# Patient Record
Sex: Male | Born: 1945 | Race: White | Hispanic: No | Marital: Married | Smoking: Former smoker
Health system: Southern US, Community
[De-identification: ages and names within clinical notes are randomized; demographics above are authoritative.]

## PROBLEM LIST (undated history)

## (undated) DIAGNOSIS — K219 Gastro-esophageal reflux disease without esophagitis: Secondary | ICD-10-CM

## (undated) DIAGNOSIS — I4891 Unspecified atrial fibrillation: Secondary | ICD-10-CM

## (undated) DIAGNOSIS — F419 Anxiety disorder, unspecified: Secondary | ICD-10-CM

## (undated) DIAGNOSIS — Z8489 Family history of other specified conditions: Secondary | ICD-10-CM

## (undated) DIAGNOSIS — I499 Cardiac arrhythmia, unspecified: Secondary | ICD-10-CM

## (undated) DIAGNOSIS — T7840XA Allergy, unspecified, initial encounter: Secondary | ICD-10-CM

## (undated) DIAGNOSIS — Z86010 Personal history of colonic polyps: Secondary | ICD-10-CM

## (undated) DIAGNOSIS — E7800 Pure hypercholesterolemia, unspecified: Secondary | ICD-10-CM

## (undated) DIAGNOSIS — R413 Other amnesia: Secondary | ICD-10-CM

## (undated) DIAGNOSIS — R351 Nocturia: Secondary | ICD-10-CM

## (undated) DIAGNOSIS — K317 Polyp of stomach and duodenum: Secondary | ICD-10-CM

## (undated) DIAGNOSIS — N400 Enlarged prostate without lower urinary tract symptoms: Secondary | ICD-10-CM

## (undated) DIAGNOSIS — M1990 Unspecified osteoarthritis, unspecified site: Secondary | ICD-10-CM

## (undated) HISTORY — PX: APPENDECTOMY: SHX54

## (undated) HISTORY — DX: Unspecified atrial fibrillation: I48.91

## (undated) HISTORY — DX: Pure hypercholesterolemia, unspecified: E78.00

## (undated) HISTORY — DX: Other amnesia: R41.3

## (undated) HISTORY — DX: Benign prostatic hyperplasia without lower urinary tract symptoms: N40.0

## (undated) HISTORY — DX: Allergy, unspecified, initial encounter: T78.40XA

## (undated) HISTORY — PX: COLOSTOMY: SHX63

## (undated) HISTORY — DX: Polyp of stomach and duodenum: K31.7

## (undated) HISTORY — DX: Personal history of colonic polyps: Z86.010

## (undated) HISTORY — PX: COLONOSCOPY W/ POLYPECTOMY: SHX1380

---

## 2000-08-12 ENCOUNTER — Encounter: Admission: RE | Admit: 2000-08-12 | Discharge: 2000-08-12 | Payer: Self-pay | Admitting: Family Medicine

## 2000-08-12 ENCOUNTER — Encounter: Payer: Self-pay | Admitting: Family Medicine

## 2004-01-09 ENCOUNTER — Encounter: Admission: RE | Admit: 2004-01-09 | Discharge: 2004-01-09 | Payer: Self-pay | Admitting: Family Medicine

## 2005-01-21 HISTORY — PX: OTHER SURGICAL HISTORY: SHX169

## 2005-06-04 ENCOUNTER — Ambulatory Visit: Payer: Self-pay | Admitting: Internal Medicine

## 2005-06-11 ENCOUNTER — Ambulatory Visit: Payer: Self-pay | Admitting: Internal Medicine

## 2005-06-11 ENCOUNTER — Encounter (INDEPENDENT_AMBULATORY_CARE_PROVIDER_SITE_OTHER): Payer: Self-pay | Admitting: *Deleted

## 2006-03-24 ENCOUNTER — Encounter: Admission: RE | Admit: 2006-03-24 | Discharge: 2006-03-24 | Payer: Self-pay | Admitting: Family Medicine

## 2006-04-04 ENCOUNTER — Inpatient Hospital Stay (HOSPITAL_COMMUNITY): Admission: RE | Admit: 2006-04-04 | Discharge: 2006-04-05 | Payer: Self-pay | Admitting: Neurosurgery

## 2008-05-05 ENCOUNTER — Encounter (INDEPENDENT_AMBULATORY_CARE_PROVIDER_SITE_OTHER): Payer: Self-pay | Admitting: *Deleted

## 2009-02-03 ENCOUNTER — Telehealth: Payer: Self-pay | Admitting: Internal Medicine

## 2009-05-09 ENCOUNTER — Encounter (INDEPENDENT_AMBULATORY_CARE_PROVIDER_SITE_OTHER): Payer: Self-pay | Admitting: *Deleted

## 2009-05-10 ENCOUNTER — Ambulatory Visit: Payer: Self-pay | Admitting: Internal Medicine

## 2009-05-23 ENCOUNTER — Ambulatory Visit: Payer: Self-pay | Admitting: Internal Medicine

## 2009-05-23 DIAGNOSIS — Z860101 Personal history of adenomatous and serrated colon polyps: Secondary | ICD-10-CM

## 2009-05-23 DIAGNOSIS — Z86010 Personal history of colonic polyps: Secondary | ICD-10-CM

## 2009-05-23 HISTORY — DX: Personal history of adenomatous and serrated colon polyps: Z86.0101

## 2009-05-23 HISTORY — DX: Personal history of colonic polyps: Z86.010

## 2009-05-24 ENCOUNTER — Encounter: Payer: Self-pay | Admitting: Internal Medicine

## 2009-06-06 ENCOUNTER — Encounter: Payer: Self-pay | Admitting: Cardiovascular Disease

## 2009-09-26 ENCOUNTER — Encounter: Payer: Self-pay | Admitting: Cardiovascular Disease

## 2009-09-26 ENCOUNTER — Emergency Department (HOSPITAL_COMMUNITY): Admission: EM | Admit: 2009-09-26 | Discharge: 2009-09-26 | Payer: Self-pay | Admitting: Emergency Medicine

## 2009-09-29 ENCOUNTER — Encounter: Payer: Self-pay | Admitting: Cardiovascular Disease

## 2009-10-02 ENCOUNTER — Telehealth: Payer: Self-pay | Admitting: Cardiovascular Disease

## 2009-10-02 DIAGNOSIS — R413 Other amnesia: Secondary | ICD-10-CM | POA: Insufficient documentation

## 2009-10-02 DIAGNOSIS — R030 Elevated blood-pressure reading, without diagnosis of hypertension: Secondary | ICD-10-CM | POA: Insufficient documentation

## 2009-10-02 DIAGNOSIS — E7800 Pure hypercholesterolemia, unspecified: Secondary | ICD-10-CM | POA: Insufficient documentation

## 2009-10-02 DIAGNOSIS — I4891 Unspecified atrial fibrillation: Secondary | ICD-10-CM | POA: Insufficient documentation

## 2009-10-02 DIAGNOSIS — Z87898 Personal history of other specified conditions: Secondary | ICD-10-CM | POA: Insufficient documentation

## 2009-10-03 ENCOUNTER — Ambulatory Visit: Payer: Self-pay | Admitting: Cardiovascular Disease

## 2009-10-03 ENCOUNTER — Telehealth (INDEPENDENT_AMBULATORY_CARE_PROVIDER_SITE_OTHER): Payer: Self-pay | Admitting: *Deleted

## 2009-10-10 ENCOUNTER — Encounter: Payer: Self-pay | Admitting: Cardiovascular Disease

## 2009-10-10 ENCOUNTER — Encounter: Admission: RE | Admit: 2009-10-10 | Discharge: 2009-10-10 | Payer: Self-pay | Admitting: Emergency Medicine

## 2009-10-11 ENCOUNTER — Encounter: Payer: Self-pay | Admitting: Cardiovascular Disease

## 2009-10-12 ENCOUNTER — Emergency Department (HOSPITAL_COMMUNITY): Admission: EM | Admit: 2009-10-12 | Discharge: 2009-10-12 | Payer: Self-pay | Admitting: Emergency Medicine

## 2009-10-12 ENCOUNTER — Inpatient Hospital Stay (HOSPITAL_COMMUNITY): Admission: EM | Admit: 2009-10-12 | Discharge: 2009-10-14 | Payer: Self-pay | Admitting: Emergency Medicine

## 2009-10-12 ENCOUNTER — Telehealth: Payer: Self-pay | Admitting: Cardiovascular Disease

## 2009-10-12 ENCOUNTER — Ambulatory Visit: Payer: Self-pay | Admitting: Cardiology

## 2009-10-13 ENCOUNTER — Encounter: Payer: Self-pay | Admitting: Cardiovascular Disease

## 2009-10-20 ENCOUNTER — Encounter (INDEPENDENT_AMBULATORY_CARE_PROVIDER_SITE_OTHER): Payer: Self-pay | Admitting: *Deleted

## 2009-10-25 ENCOUNTER — Ambulatory Visit: Payer: Self-pay | Admitting: Cardiovascular Disease

## 2009-10-28 ENCOUNTER — Telehealth: Payer: Self-pay | Admitting: Internal Medicine

## 2009-10-30 ENCOUNTER — Telehealth: Payer: Self-pay | Admitting: Internal Medicine

## 2009-11-02 ENCOUNTER — Ambulatory Visit: Payer: Self-pay | Admitting: Cardiovascular Disease

## 2009-11-20 ENCOUNTER — Ambulatory Visit: Payer: Self-pay | Admitting: Internal Medicine

## 2010-02-18 LAB — CONVERTED CEMR LAB: Prothrombin Time: 12.1 s — ABNORMAL HIGH (ref 9.7–11.8)

## 2010-02-20 NOTE — Progress Notes (Signed)
Summary: Event Monitor Readings AFIB RVR  Phone Note Outgoing Call   Call placed by: Whitney Maeola Sarah RN,  October 12, 2009 10:15 AM Call placed to: Patient Details for Reason: Event Monitor Information  Follow-up for Phone Call        Spoke to Pt. Pt. went to the ED @ [facility] last night 9/22 with HR 120-130s ( life watch called & advised him to do so) and they gave him 20mg  Cardizem IV & wanted him to f/u with our office ASAP. Pt. still having some palpitations but no CP/SOB. Recent event monitor readings this morning show HR of 180-190s. Pt. stated he is not able to manually call in the readings b/c his monitor is not working properly. Advised pt since we do not currently know what his HR is & last readings 1 hour ago show HR 180-190 that he should go to the ER especially since he lives 1 hour away. Called [facility] ED to let them know he is coming. Will continue to follow up with him. Whitney Maeola Sarah RN  October 12, 2009 10:20 AM

## 2010-02-20 NOTE — Progress Notes (Signed)
Summary: pt needs to be seen this week  Phone Note Call from Patient Call back at [phone]   Caller: Spouse/Judy Reason for Call: Talk to Nurse, Talk to Doctor Summary of Call: pt got a call from Dr. Johney Frame this weekend and Dr. Johney Frame told the patinet he would work the pt in this week pt is wearing a monitor. Pt cant come today or tomorrow but can come any other day Initial call taken by: Omer Jack,  October 30, 2009 8:39 AM  Follow-up for Phone Call        pt to see Dr Ladona Ridgel at 12:00 on Thursday Dennis Bast, RN, BSN  October 30, 2009 12:20 PM
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
## 2010-02-20 NOTE — Procedures (Signed)
Summary: Colonoscopy  Patient: Joel [name] Note: All result statuses are Final unless otherwise noted.  Tests: (1) Colonoscopy (COL)   COL Colonoscopy           DONE     [facility]     [address]     [place], [place]  [address]           COLONOSCOPY PROCEDURE REPORT           PATIENT:  Joel, [name]  MR#:  [id number]     BIRTHDATE:  31-Jul-1945, 63 yrs. old  GENDER:  male     ENDOSCOPIST:  Iva Boop, MD, [facility]           PROCEDURE DATE:  05/23/2009     PROCEDURE:  Colonoscopy with snare polypectomy     ASA CLASS:  Class I     INDICATIONS:  surveillance and high-risk screening, history of     pre-cancerous (adenomatous) colon polyps, family history of colon     cancer 10 mm adenoma removed 5/07     sister had colon cancer in her 28's     MEDICATIONS:   Fentanyl 75 mcg IV, Versed 8 mg IV           DESCRIPTION OF PROCEDURE:   After the risks benefits and     alternatives of the procedure were thoroughly explained, informed     consent was obtained.  Digital rectal exam was performed and     revealed no abnormalities and normal prostate.   The LB CF-[id number]     [id number] endoscope was introduced through the anus and advanced to     the cecum, which was identified by both the appendix and ileocecal     valve, without limitations.  The quality of the prep was     excellent, using MoviPrep.  The instrument was then slowly     withdrawn as the colon was fully examined.     Insertion: 5:38 minutes Withdrawal: 8:57 minutes     <<PROCEDUREIMAGES>>           FINDINGS:  Two polyps were found. 7-8 mm splenic flexure polyp and     a 6 mm descending colon polyp. Polyps were snared without cautery.     Retrieval was successful. snare polyp  This was otherwise a normal     examination of the colon. Including right colon retroflex exam.     Retroflexed views in the rectum revealed no abnormalities.    The     scope was then withdrawn from the patient and the procedure    completed.           COMPLICATIONS:  None     ENDOSCOPIC IMPRESSION:     1) Two polyps removed, maximum size 8 mm     2) Otherwise normal examination, excellent prep           3) Personal history of 10 mm adenoma removal 5/07     4) Family history of colon cancer (sister)           REPEAT EXAM:  In for Colonoscopy, pending biopsy results.           Iva Boop, MD, [facility]           CC:  Mosetta Putt, MD     The Patient           n.     eSIGNED:   Iva Boop at 05/23/2009 12:43 PM  Shelp, Hajime, [id number]  Note: An exclamation mark (!) indicates a result that was not dispersed into the flowsheet. Document Creation Date: 05/23/2009 12:44 PM _______________________________________________________________________  (1) Order result status: Final Collection or observation date-time: 05/23/2009 12:30 Requested date-time:  Receipt date-time:  Reported date-time:  Referring Physician:   Ordering Physician: Stan Head [phone]) Specimen Source:  Source: Launa Grill Order Number: [id number] Lab site:   Appended Document: Colonoscopy   Colonoscopy  Procedure date:  05/23/2009  Findings:       1) Two polyps removed, maximum size 8 mm 1 ADENOMA, 1 BENIGN TISSUE     2) Otherwise normal examination, excellent prep           3) Personal history of 10 mm adenoma removal 5/07     4) Family history of colon cancer (sister)  Comments:      Repeat colonoscopy in 5 years.   Procedures Next Due Date:    Colonoscopy: 05/2014   Appended Document: Colonoscopy     Procedures Next Due Date:    Colonoscopy: 05/2014

## 2010-02-20 NOTE — Progress Notes (Signed)
Summary: event monitor  Phone Note Outgoing Call   Action Taken: Appt scheduled Summary of Call: Monitor will be mail to pt by [facility]. Initial call taken by: Marcos Eke,  October 03, 2009 3:41 PM

## 2010-02-20 NOTE — Progress Notes (Signed)
  Phone Note Other Incoming   Caller: Banker of Call: I received a call from [employer] this am stating that the patient had been in afib all morning and has a heart rate 160s. I spoke with the patient who states that he is ballroom dancing this am. He reports palpitations but otherwise feels well.  I have instructed him to take metoprolol succinate 25mg  now and then resume daily.  He has requested that I call this in to a local pharmacy. I have therefore called in metoprolol succinate 25mg  daily #30 to [facility] in [place].   Pt instructed to follow-up with Dr Ladona Ridgel as referred by Dr Clifton James.   Initial call taken by: Hillis Range, MD,  October 28, 2009 11:43 AM

## 2010-02-20 NOTE — Miscellaneous (Signed)
Summary: [facility] Previsit/prep  Clinical Lists Changes  Medications: Added new medication of MOVIPREP 100 GM  SOLR (PEG-KCL-NACL-NASULF-NA ASC-C) As per prep instructions. - Signed Rx of MOVIPREP 100 GM  SOLR (PEG-KCL-NACL-NASULF-NA ASC-C) As per prep instructions.;  #1 x 0;  Signed;  Entered by: [name];  Authorized by: Iva Boop MD, [facility];  Method used: Electronically to [facility] [address] [phone]*, [address] [place], [place]  [address], Ph: [phone], Fax: [phone] Allergies: Added new allergy or adverse reaction of PENICILLIN Observations: Added new observation of NKA: F (05/10/2009 14:17)    Prescriptions: MOVIPREP 100 GM  SOLR (PEG-KCL-NACL-NASULF-NA ASC-C) As per prep instructions.  #1 x 0   Entered by:   [name]   Authorized by:   Iva Boop MD, [facility]   Signed by:   [name] on 05/10/2009   Method used:   Electronically to        [facility] [phone]* (retail)       [address] [place]  [address]       Ph: [phone]       Fax: [phone]   RxID:   [id number]

## 2010-02-20 NOTE — Consult Note (Signed)
Summary: [facility]  [place] [facility]   Imported By: Roderic Ovens 11/06/2009 17:06:16  _____________________________________________________________________  External Attachment:    Type:   Image     Comment:   External Document

## 2010-02-20 NOTE — Assessment & Plan Note (Signed)
Summary: per check out/sf   Visit Type:  Follow-up Primary Dacoda Finlay:  Golden Hurter   History of Present Illness: Mr. 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  The patient notes that he has had palpitations for over 10 yrs.  Each year his symptoms worsened and were associated with more frequent and prolonged palpitations.  He was diagnosed with atrial fibrillation several weeks ago.  He had an episode of where he had amnesia vs a TIA.  He was placed on beta blockers and calcium channel blockers and had minimal improvement in his symptoms.  He has worn a cardiac monitor which has demonstrated rapid atrial fib at rates of over 160/min as well as sinus bradycardia with over 3 second pauses and rates in the 30's.  He has never had frank syncope.  No other complaints.   Current Medications (verified): 1)  Tamsulosin Hcl 0.4 Mg Caps (Tamsulosin Hcl) .Marland Kitchen.. 1 Cap Once Daily 2)  Testim 50 Mg/5gm Gel (Testosterone) .... Apply As Directed 3)  Coumadin 5 Mg Tabs (Warfarin Sodium) .... Take As Directed 4)  Flovent Hfa 110 Mcg/act Aero (Fluticasone Propionate  Hfa) .... 2 Puffs Two Times A Day 5)  Ventolin Hfa 108 (90 Base) Mcg/act Aers (Albuterol Sulfate) .... As Needed 6)  Flonase 50 Mcg/act Susp (Fluticasone Propionate) .Marland Kitchen.. 1 Spray Once Daily 7)  Diltiazem Hcl Er Beads 180 Mg Xr24h-Cap (Diltiazem Hcl Er Beads) .Marland Kitchen.. 1 Cap Once Daily 8)  Lipitor 10 Mg Tabs (Atorvastatin Calcium) .... 1/2 Tab At Bedtime 9)  Metoprolol Succinate 25 Mg Xr24h-Tab (Metoprolol Succinate) .... Take One Tablet By Mouth Daily  Allergies: 1)  ! Penicillin  Past History:  Past Medical History: Last updated: 10/25/2009 HYPERCHOLESTEROLEMIA (ICD-272.0) ATRIAL FIBRILLATION (ICD-427.31) BENIGN PROSTATIC HYPERTROPHY, HX OF (ICD-V13.8) MEMORY LOSS (ICD-780.93)    Past Surgical History: Last updated: 10/03/2009 Back Surgery.Marland Kitchen C5-C6 and C6-C7 discectomy,  decompression of the spinal  cord, bilateral foraminotomy, interbody fusion with allograft plate,  microscope. SURGEON:  Hilda Lias, M.D. Appendectomy 25 years ago    Family History: Last updated: 10/03/2009 Father - hx/o A. fib and stroke age 56s.  Deceased s/p PNA Mother - HTN, HLD, s/p several stents placed.  Alive age 65 Sister - HTN, s/p bowel resection after tumor removal  Social History: Last updated: 10/25/2009 Tobacco Use - No.  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    Review of Systems       All systems reviewed and negative except as noted in the HPI except for some episodes of anxiety.  Vital Signs:  Patient profile:   65 year old male Height:      76 inches Weight:      179 pounds BMI:     21.87 Pulse rate:   64 / minute BP sitting:   106 / 86  (left arm)  Vitals Entered By: Laurance Flatten CMA (November 02, 2009 12:23 PM)  Physical Exam  General:  General: Well developed, well nourished, NAD SKIN: warm, dry Neuro: No focal deficits Psychiatric: Mood and affect normal Neck: No JVD, no carotid bruits, no thyromegaly, no lymphadenopathy. Lungs:Clear bilaterally, no wheezes, rhonci, crackles CV: Regular rhythm, bracycardic,  no murmurs, gallops rubs Abdomen: soft, NT, ND, BS present Extremities: No edema, pulses 2+.    Event Monitor  Procedure date:  10/18/2009  Findings:      NSR atrial fib with an RVR Sinus bradycardia with pauses of over 3 seconds.  Impression & Recommendations:  Problem # 1:  ATRIAL FIBRILLATION (ICD-427.31) I have discussed the treatment options with the patient and his spouse in detail.  We have decided on stopping cardizem, starting flecainide, and continuing low dose metoprolol.  With his bradycardia, he may ultimately require PPM.  Also, catheter ablation of his fibrillation  would be an option if he fails medical therapy.  I will see him back in several weeks.  I have recommended that he limit his ETOH and caffiene intake to one a day. His updated medication list for this problem includes:    Metoprolol Succinate 25 Mg Xr24h-tab (Metoprolol succinate) .Marland Kitchen... Take one tablet by mouth daily    Flecainide Acetate 100 Mg Tabs (Flecainide acetate) ..... One by mouth bid  Orders: Treadmill (Treadmill)  Problem # 2:  COUMADIN THERAPY (ICD-V58.61) I have recommended that he continue this for the foreseeable future.  Pradaxa would also be an option.  Problem # 3:  ELEVATED BLOOD PRESSURE (ICD-796.2) I have asked him to maintain a low sodium diet along with his current meds.  Patient Instructions: 1)  Your physician recommends that you schedule a follow-up appointment in: 10 days with Dr Ladona Ridgel [facility] 2)  Your physician has requested that you have an exercise tolerance test.  For further information please visit [email or web address]  Please also follow instruction sheet, as given. 3)  Your physician has recommended you make the following change in your medication: stop Coumadin and after 3 days start Pradaxa 150mg  two times a day, stop Diltiazem and start Flecainide 100mg  two times a day Prescriptions: FLECAINIDE ACETATE 100 MG TABS (FLECAINIDE ACETATE) one by mouth bid  #60 x 11   Entered by:   Dennis Bast, RN, BSN   Authorized by:   Laren Boom, MD, [facility]   Signed by:   Dennis Bast, RN, BSN on 11/02/2009   Method used:   Electronically to        [facility] [address] [phone]* (retail)       [address] [place], [place]  [address]       Ph: [phone]       Fax: [phone]   RxID:   [id number] PRADAXA 150 MG CAPS (DABIGATRAN ETEXILATE MESYLATE) one by mouth two times a day  non-valvular afib  #60 x 11   Entered by:   Dennis Bast, RN, BSN   Authorized by:   Laren Boom, MD, [facility]   Signed by:   Dennis Bast, RN, BSN on 11/02/2009    Method used:   Electronically to        [facility] [phone]* (retail)       [address], [place]  [address]       Ph: [phone]       Fax: [phone]   RxID:   [id number]

## 2010-02-20 NOTE — Miscellaneous (Signed)
Summary: med update  Clinical Lists Changes  Medications: Changed medication from TAMSULOSIN HCL 0.4 MG CAPS (TAMSULOSIN HCL) 1 cap once daily to TAMSULOSIN HCL 0.4 MG CAPS (TAMSULOSIN HCL) 1 cap two times a day Added new medication of DILTIAZEM HCL ER BEADS 180 MG XR24H-CAP (DILTIAZEM HCL ER BEADS) 1 cap once daily Added new medication of LIPITOR 10 MG TABS (ATORVASTATIN CALCIUM) 1 tab at bedtime

## 2010-02-20 NOTE — Procedures (Signed)
Summary: [employer]  [employer] [facility]   Imported By: Erle Crocker 11/08/2009 15:32:26  _____________________________________________________________________  External Attachment:    Type:   Image     Comment:   External Document

## 2010-02-20 NOTE — Letter (Signed)
Summary: [facility] Instructions  [facility]  [address] [place], [place] [address]   Phone: [phone]  Fax: [phone]       Joel [name]    24-Sep-1945    MRN: [id number]        Procedure Day Dorna Bloom: [name]  05/23/09     Arrival Time:  10:00am     Procedure Time:  11:00am     Location of Procedure:                    _ X_  [facility] (4th Floor)                       PREPARATION FOR COLONOSCOPY WITH MOVIPREP   Starting 5 days prior to your procedure  Thursday 04/28  do not eat nuts, seeds, popcorn, corn, beans, peas,  salads, or any raw vegetables.  Do not take any fiber supplements (e.g. Metamucil, Citrucel, and Benefiber).  THE DAY BEFORE YOUR PROCEDURE         DATE:  05/02   DAY:  Monday  1.  Drink clear liquids the entire day-NO SOLID FOOD  2.  Do not drink anything colored red or purple.  Avoid juices with pulp.  No orange juice.  3.  Drink at least 64 oz. (8 glasses) of fluid/clear liquids during the day to prevent dehydration and help the prep work efficiently.  CLEAR LIQUIDS INCLUDE: Water Jello Ice Popsicles Tea (sugar ok, no milk/cream) Powdered fruit flavored drinks Coffee (sugar ok, no milk/cream) Gatorade Juice: apple, white grape, white cranberry  Lemonade Clear bullion, consomm, broth Carbonated beverages (any kind) Strained chicken noodle soup Hard Candy                             4.  In the morning, mix first dose of MoviPrep solution:    Empty 1 Pouch A and 1 Pouch B into the disposable container    Add lukewarm drinking water to the top line of the container. Mix to dissolve    Refrigerate (mixed solution should be used within 24 hrs)  5.  Begin drinking the prep at 5:00 p.m. The MoviPrep container is divided by 4 marks.   Every 15 minutes drink the solution down to the next mark (approximately 8 oz) until the full liter is complete.   6.  Follow completed prep with 16 oz of clear liquid of your  choice (Nothing red or purple).  Continue to drink clear liquids until bedtime.  7.  Before going to bed, mix second dose of MoviPrep solution:    Empty 1 Pouch A and 1 Pouch B into the disposable container    Add lukewarm drinking water to the top line of the container. Mix to dissolve    Refrigerate  THE DAY OF YOUR PROCEDURE      DATE:  05/03 DAY:  Tuesday  Beginning at  6:00 a.m. (5 hours before procedure):         1. Every 15 minutes, drink the solution down to the next mark (approx 8 oz) until the full liter is complete.  2. Follow completed prep with 16 oz. of clear liquid of your choice.    3. You may drink clear liquids until  9:00am  (2 HOURS BEFORE PROCEDURE).   MEDICATION INSTRUCTIONS  Unless otherwise instructed, you should take regular prescription medications with a small sip of  water   as early as possible the morning of your procedure.         OTHER INSTRUCTIONS  You will need a responsible adult at least 63 years of age to accompany you and drive you home.   This person must remain in the waiting room during your procedure.  Wear loose fitting clothing that is easily removed.  Leave jewelry and other valuables at home.  However, you may wish to bring a book to read or  an iPod/MP3 player to listen to music as you wait for your procedure to start.  Remove all body piercing jewelry and leave at home.  Total time from sign-in until discharge is approximately 2-3 hours.  You should go home directly after your procedure and rest.  You can resume normal activities the  day after your procedure.  The day of your procedure you should not:   Drive   Make legal decisions   Operate machinery   Drink alcohol   Return to work  You will receive specific instructions about eating, activities and medications before you leave.    The above instructions have been reviewed and explained to me by   [name]  May 10, 2009 3:12 PM     I  fully understand and can verbalize these instructions _____________________________ Date _________
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
## 2010-02-20 NOTE — Progress Notes (Signed)
Summary: Schedule Colonoscopy  Phone Note Outgoing Call Call back at Home Phone [phone]   Call placed by: Harlow Mares CMA Duncan Dull),  February 03, 2009 12:55 PM Call placed to: Patient Summary of Call: Left message on patients machine to call back. patient needs direct colonoscopy.  Initial call taken by: Harlow Mares CMA Duncan Dull),  February 03, 2009 12:56 PM  Follow-up for Phone Call        Left message on patients machine to call back. Follow-up by: Harlow Mares CMA Duncan Dull),  February 13, 2009 3:33 PM  Additional Follow-up for Phone Call Additional follow up Details #1::        patients vm hung up before I was able to leave a message Additional Follow-up by: Harlow Mares CMA (AAMA),  February 22, 2009 10:41 AM

## 2010-02-20 NOTE — Progress Notes (Signed)
Summary: Please draw PT/INR at [facility] on 10/03/09  Phone Note From Other Clinic Call back at [phone]   [facility]  Call For: [facility] Summary of Call: Pt is seeing Dr Sanjuana Kava tommorow as a new pt, pt is a new afib pt and was started on Coumadin by [facility] Friday week, and has not had PT/INR drawn since start.  Pt is going out of town to [place] and will not be able to come by their office to check PT/INR prior to leaving town.  They will follow, but are requesting PT/INR be drawn tomorrow at our office and results be faxed to them at [phone]. Initial call taken by: Cloyde Reams RN,  October 02, 2009 4:11 PM

## 2010-02-20 NOTE — Letter (Signed)
Summary: Patient Notice- Polyp Results  [facility]  [address] [place], [place] [address]   Phone: [phone]  Fax: [phone]        May 24, 2009 MRN: [id number]    [facility] [address] [place], [place]  [address]    Dear Joel [name],  One of the polyps removed from your colon was adenomatous. This means that it was pre-cancerous or that  it had the potential to change into cancer over time. The other (suspected) polyp was benign colon tissue, not a polyp.  Since you have these polyps, a history of polyps in 2007 as well as a sibling with colon cancer in the past, I recommend that you have a repeat colonoscopy in 5 years. If you develop any new rectal bleeding, abdominal pain or significant bowel habit changes, please contact us before then.  Please call us if you are having persistent problems or have questions about your condition that have not been fully answered at this time.  Sincerely,  Iva Boop MD, [facility]  This letter has been electronically signed by your physician.  Appended Document: Patient Notice- Polyp Results letter mailed 5.10.11

## 2010-02-20 NOTE — Letter (Signed)
Summary: [place] [facility] Patient Form  [facility] [facility] Patient Form   Imported By: Marylou Mccoy 11/20/2009 08:05:38  _____________________________________________________________________  External Attachment:    Type:   Image     Comment:   External Document

## 2010-02-21 ENCOUNTER — Telehealth: Payer: Self-pay | Admitting: Internal Medicine

## 2010-02-23 NOTE — Letter (Signed)
Summary: [facility]  [facility]   Imported By: Marylou Mccoy 10/17/2009 12:16:17  _____________________________________________________________________  External Attachment:    Type:   Image     Comment:   External Document

## 2010-03-08 NOTE — Progress Notes (Addendum)
Summary: rx refill  Phone Note Call from Patient Call back at Home Phone [phone]   Caller: Patient Reason for Call: Talk to Nurse Summary of Call: pt wants to know if he can have a small sample of pradaxa until his mail order comes in. pt states his mail order will be in five days. Initial call taken by: Roe Coombs,  February 21, 2010 9:10 AM  Follow-up for Phone Call        Samples left out front for patient to pick up.Pradaxa 150mg  24 caps. LMOVM for patient to pick up.  Layne Benton, RN, BSN  February 26, 2010 3:44 PM

## 2010-04-05 LAB — CARDIAC PANEL(CRET KIN+CKTOT+MB+TROPI)
CK, MB: 1.9 ng/mL (ref 0.3–4.0)
Relative Index: INVALID (ref 0.0–2.5)
Relative Index: INVALID (ref 0.0–2.5)
Total CK: 63 U/L (ref 7–232)
Troponin I: <0.01 ng/mL (ref 0.00–0.06)

## 2010-04-05 LAB — BASIC METABOLIC PANEL
Calcium: 8.7 mg/dL (ref 8.4–10.5)
Calcium: 9.2 mg/dL (ref 8.4–10.5)
GFR calc Af Amer: >60 mL/min (ref >60)
GFR calc non Af Amer: >60 mL/min (ref >60)
GFR calc non Af Amer: >60 mL/min (ref >60)
Potassium: 4.2 mEq/L (ref 3.5–5.1)
Potassium: 4.4 mEq/L (ref 3.5–5.1)
Sodium: 137 mEq/L (ref 135–145)
Sodium: 138 mEq/L (ref 135–145)

## 2010-04-05 LAB — URINALYSIS, ROUTINE W REFLEX MICROSCOPIC
Bilirubin Urine: NEGATIVE
Hgb urine dipstick: NEGATIVE
Ketones, ur: NEGATIVE mg/dL
Ketones, ur: NEGATIVE mg/dL
Nitrite: NEGATIVE
Nitrite: NEGATIVE
Protein, ur: NEGATIVE mg/dL
Protein, ur: NEGATIVE mg/dL
Specific Gravity, Urine: 1.02 (ref 1.005–1.030)
Urobilinogen, UA: 0.2 mg/dL (ref 0.0–1.0)
Urobilinogen, UA: 0.2 mg/dL (ref 0.0–1.0)

## 2010-04-05 LAB — CBC
HCT: 47 % (ref 39.0–52.0)
HCT: 42.1 % (ref 39.0–52.0)
Hemoglobin: 16 g/dL (ref 13.0–17.0)
MCHC: 34.1 g/dL (ref 30.0–36.0)
Platelets: 202 10*3/uL (ref 150–400)
RDW: 12.9 % (ref 11.5–15.5)
RDW: 12.6 % (ref 11.5–15.5)
WBC: 11.2 10*3/uL — ABNORMAL HIGH (ref 4.0–10.5)

## 2010-04-05 LAB — DIFFERENTIAL
Basophils Absolute: 0 10*3/uL (ref 0.0–0.1)
Eosinophils Absolute: 0.1 10*3/uL (ref 0.0–0.7)
Lymphocytes Relative: 19 % (ref 12–46)
Lymphs Abs: 2.6 10*3/uL (ref 0.7–4.0)
Monocytes Absolute: 0.9 10*3/uL (ref 0.1–1.0)
Monocytes Relative: 8 % (ref 3–12)
Monocytes Relative: 6 % (ref 3–12)
Neutro Abs: 8 10*3/uL — ABNORMAL HIGH (ref 1.7–7.7)
Neutrophils Relative %: 72 % (ref 43–77)
Neutrophils Relative %: 68 % (ref 43–77)

## 2010-04-05 LAB — POCT CARDIAC MARKERS
CKMB, poc: 1.1 ng/mL (ref 1.0–8.0)
CKMB, poc: <1 ng/mL — ABNORMAL LOW (ref 1.0–8.0)
Myoglobin, poc: 37.5 ng/mL (ref 12–200)
Troponin i, poc: <0.05 ng/mL (ref 0.00–0.09)
Troponin i, poc: <0.05 ng/mL (ref 0.00–0.09)

## 2010-04-05 LAB — COMPREHENSIVE METABOLIC PANEL
ALT: 24 U/L (ref 0–53)
Albumin: 3.9 g/dL (ref 3.5–5.2)
Calcium: 8.9 mg/dL (ref 8.4–10.5)
GFR calc Af Amer: >60 mL/min (ref >60)
Glucose, Bld: 93 mg/dL (ref 70–99)
Sodium: 139 mEq/L (ref 135–145)
Total Protein: 6.5 g/dL (ref 6.0–8.3)

## 2010-04-05 LAB — LIPID PANEL
Cholesterol: 215 mg/dL — ABNORMAL HIGH (ref 0–200)
LDL Cholesterol: 148 mg/dL — ABNORMAL HIGH (ref 0–99)
Triglycerides: 127 mg/dL (ref <150)
VLDL: 25 mg/dL (ref 0–40)

## 2010-04-05 LAB — APTT: aPTT: 30 seconds (ref 24–37)

## 2010-04-05 LAB — POCT I-STAT, CHEM 8
Calcium, Ion: 1.06 mmol/L — ABNORMAL LOW (ref 1.12–1.32)
Creatinine, Ser: 0.9 mg/dL (ref 0.4–1.5)
Glucose, Bld: 97 mg/dL (ref 70–99)
Hemoglobin: 17.3 g/dL — ABNORMAL HIGH (ref 13.0–17.0)
TCO2: 25 mmol/L (ref 0–100)

## 2010-04-05 LAB — HEPATIC FUNCTION PANEL
ALT: 35 U/L (ref 0–53)
AST: 19 U/L (ref 0–37)
Albumin: 3.7 g/dL (ref 3.5–5.2)
Bilirubin, Direct: 0.2 mg/dL (ref 0.0–0.3)

## 2010-04-05 LAB — PROTIME-INR
INR: 1.24 (ref 0.00–1.49)
INR: 1.31 (ref 0.00–1.49)
Prothrombin Time: 16.5 seconds — ABNORMAL HIGH (ref 11.6–15.2)

## 2010-04-05 LAB — HEMOGLOBIN A1C
Hgb A1c MFr Bld: 5.9 % — ABNORMAL HIGH (ref <5.7)
Mean Plasma Glucose: 123 mg/dL — ABNORMAL HIGH (ref <117)

## 2010-06-08 NOTE — Op Note (Signed)
NAMEPHILO, Joel [name]            ACCOUNT NO.:  [id number]   MEDICAL RECORD NO.:  [id number]          PATIENT TYPE:  INP   LOCATION:  5018                         FACILITY:  [facility]   PHYSICIAN:  Hilda Lias, M.D.   DATE OF BIRTH:  Nov 28, 1945   DATE OF PROCEDURE:  04/04/2006  DATE OF DISCHARGE:  04/05/2006                               OPERATIVE REPORT   PREOPERATIVE DIAGNOSIS:  C5-C6 herniated disc with stenosis, C6-C7  spondylosis and herniated disc, bilateral radiculopathy.   POSTOPERATIVE DIAGNOSIS:  C5-C6 herniated disc with stenosis, C6-C7  spondylosis and herniated disc, bilateral radiculopathy.   PROCEDURE:  C5-C6 and C6-C7 discectomy, decompression of the spinal  cord, bilateral foraminotomy, interbody fusion with allograft plate,  microscope.   SURGEON:  Hilda Lias, M.D.   ASSISTANT:  Cristi Loron, M.D.   CLINICAL HISTORY:  The patient was seen in my office complaining of neck  pain.  X-rays showed that he had weakness of the biceps and triceps. MRI  showed severe stenosis at the level of C5-C6 with a central herniated  disc and 3-4 mm canal.  At the level of C5-C6 and C6-C7, he has  spondylosis with bilateral foraminal compromise.  Surgery was advised  and the risks were explained in the history and physical.   DESCRIPTION OF PROCEDURE:  The patient was taken to the OR and after  intubation, the neck was cleaned with DuraPrep.  A transverse incision  was made through the skin and subcutaneous tissue.  X-rays showed that,  indeed, we were at the level of C5-C6.  The patient has a large  osteophyte anteriorly which was removed.  With the microscope, we opened  the anterior ligament at the level of C5-C6 and C6-C7.  At the level of  C5-C6, we found quite a bit of degenerative disc.  There was an opening  in the posterior ligament and after incision of the posterior ligament,  we found large amounts of fragments centrally and going to the foramen.  Decompression of the spinal cord as well as the C6 nerve root was  accomplished.  Then, at the level of C5-C6, we found mostly not only  herniated disc but also spondylosis with severe bilateral foraminal  narrowing.  Decompression of the spinal cord as well as the foramen was  achieved.  At the end, we had a good decompression at both levels.  Then, the endplates were drilled.  Two allografts of 8 mm were inserted  followed by plate using six screws.  Lateral cervical spine showed that  there was good position of the plate at the level of C5-C6.  We were  unable to see below C6.  Then, the area was irrigated.  A drain was  left.  Hemostasis was done with the bipolar.  Then the wound was closed  with Vicryl and Steri-Strips.           ______________________________  Hilda Lias, M.D.     EB/MEDQ  D:  04/04/2006  T:  04/06/2006  Job:  [id number]

## 2010-06-26 ENCOUNTER — Encounter: Payer: Self-pay | Admitting: Cardiovascular Disease

## 2010-08-17 ENCOUNTER — Telehealth: Payer: Self-pay | Admitting: Internal Medicine

## 2010-08-17 NOTE — Telephone Encounter (Signed)
Pt needs refill on metoprolol suc. 25mg  qd 30day supply called [facility] [place] [phone]

## 2010-08-20 MED ORDER — METOPROLOL SUCCINATE ER 25 MG PO TB24: 25.0000 mg | ORAL_TABLET | Freq: Every day | ORAL | Status: DC

## 2010-08-20 NOTE — Telephone Encounter (Signed)
Pt called Friday and his Rx  Is still not called in

## 2010-10-08 ENCOUNTER — Encounter: Payer: Self-pay | Admitting: Internal Medicine

## 2010-10-09 ENCOUNTER — Ambulatory Visit (INDEPENDENT_AMBULATORY_CARE_PROVIDER_SITE_OTHER): Payer: 59 | Admitting: Internal Medicine

## 2010-10-09 ENCOUNTER — Encounter: Payer: Self-pay | Admitting: Internal Medicine

## 2010-10-09 VITALS — BP 106/74 | HR 48 | Ht 75.5 in | Wt 185.8 lb

## 2010-10-09 DIAGNOSIS — E7800 Pure hypercholesterolemia, unspecified: Secondary | ICD-10-CM

## 2010-10-09 DIAGNOSIS — R030 Elevated blood-pressure reading, without diagnosis of hypertension: Secondary | ICD-10-CM

## 2010-10-09 DIAGNOSIS — I4891 Unspecified atrial fibrillation: Secondary | ICD-10-CM

## 2010-10-09 NOTE — Patient Instructions (Signed)
Your physician wants you to follow-up in: 6 months with Dr Taylor You will receive a reminder letter in the mail two months in advance. If you don't receive a letter, please call our office to schedule the follow-up appointment.  

## 2010-10-09 NOTE — Assessment & Plan Note (Signed)
He will continue his current statin therapy. He is instructed to maintain a low-fat diet.

## 2010-10-09 NOTE — Assessment & Plan Note (Signed)
The patient is maintaining sinus rhythm on medical therapy. He is bradycardic. For now he will continue his current medical therapy. Ultimately we may have to stop his beta blocker.

## 2010-10-09 NOTE — Progress Notes (Signed)
HPI Joel [name] returns today for followup. He is a pleasant 65 year old man with a history of paroxysmal atrial fibrillation, dyslipidemia, and sinus bradycardia. He was placed on flecainide for his symptomatic atrial fibrillation over 9 months ago. He has done well from an arrhythmia perspective since then. He notes that when he exercises vigorously he will become fatigued. Otherwise he feels well. He denies chest pain, shortness of breath, syncope, or peripheral edema. Allergies  Allergen Reactions  . Penicillins     REACTION: hives     Current Outpatient Prescriptions  Medication Sig Dispense Refill  . atorvastatin (LIPITOR) 10 MG tablet Take 5 mg by mouth daily.        . dabigatran (PRADAXA) 150 MG CAPS Take 150 mg by mouth every 12 (twelve) hours.        . flecainide (TAMBOCOR) 100 MG tablet Take 100 mg by mouth 2 (two) times daily.        . metoprolol succinate (TOPROL XL) 25 MG 24 hr tablet Take 1 tablet (25 mg total) by mouth daily.  30 tablet  4  . Multiple Vitamins-Minerals (OCUTABS PO) Take 1 tablet by mouth daily.        . Tamsulosin HCl (FLOMAX) 0.4 MG CAPS Take 0.4 mg by mouth 2 (two) times daily.          Past Medical History  Diagnosis Date  . Hypercholesterolemia   . Atrial fibrillation   . Benign prostatic hypertrophy   . Memory loss     ROS:   All systems reviewed and negative except as noted in the HPI.   No past surgical history on file.   Family History  Problem Relation Age of Onset  . Hypertension Mother   . Hyperlipidemia Mother   . Arrhythmia Father   . Stroke Father     60s  . Hypertension Sister      History   Social History  . Marital Status: Married    Spouse Name: N/A    Number of Children: N/A  . Years of Education: N/A   Occupational History  . psychologist    Social History Main Topics  . Smoking status: Never Smoker   . Smokeless tobacco: Not on file  . Alcohol Use: 8.4 oz/week    14 Glasses of wine per week  . Drug  Use: No  . Sexually Active:    Other Topics Concern  . Not on file   Social History Narrative   Son is mentally handicapped;Mother has moved into the home     BP 106/74  Pulse 48  Ht 6' 3.5" (1.918 m)  Wt 185 lb 12.8 oz (84.278 kg)  BMI 22.92 kg/m2  Physical Exam:  Well appearing middle aged man NAD HEENT: Unremarkable Neck:  No JVD, no thyromegally Lymphatics:  No adenopathy Back:  No CVA tenderness Lungs:  Clear. No wheezes, rales, or rhonchi. HEART:  Regular bradycardia, no murmurs, no rubs, no clicks Abd:  soft, positive bowel sounds, no organomegally, no rebound, no guarding Ext:  2 plus pulses, no edema, no cyanosis, no clubbing Skin:  No rashes no nodules Neuro:  CN II through XII intact, motor grossly intact  EKG Sinus bradycardia  Assess/Plan  :

## 2010-10-09 NOTE — Assessment & Plan Note (Signed)
His blood pressure appears to be well controlled. He will continue his current medical therapy.

## 2010-11-23 ENCOUNTER — Telehealth: Payer: Self-pay | Admitting: Internal Medicine

## 2010-11-23 NOTE — Telephone Encounter (Signed)
11/2--pt has discovered he is out of flecainide and would like [place] to call in 1 month's worth, as he normally gets his meds by mail order--RX for flecainide 100mg --take 1 tab by mouth twice daily--#60 WITH NO REFILLS CALLED IN TO [facility] IN [place]--NT

## 2010-11-23 NOTE — Telephone Encounter (Signed)
New message  Pt wants samples of flecainide or 1 month supply [facility] [place]  He usually get his meds thru mailorder  He wanted to talk to you first please call

## 2010-11-26 ENCOUNTER — Other Ambulatory Visit: Payer: Self-pay

## 2010-11-26 MED ORDER — FLECAINIDE ACETATE 100 MG PO TABS: 100.0000 mg | ORAL_TABLET | Freq: Two times a day (BID) | ORAL | Status: DC

## 2010-12-12 ENCOUNTER — Other Ambulatory Visit: Payer: Self-pay | Admitting: Internal Medicine

## 2010-12-14 ENCOUNTER — Other Ambulatory Visit: Payer: Self-pay | Admitting: Internal Medicine

## 2010-12-14 DIAGNOSIS — I10 Essential (primary) hypertension: Secondary | ICD-10-CM

## 2010-12-14 MED ORDER — METOPROLOL SUCCINATE ER 25 MG PO TB24: 25.0000 mg | ORAL_TABLET | Freq: Every day | ORAL | Status: DC

## 2010-12-14 NOTE — Telephone Encounter (Signed)
Pt calling re ordered med through mail order and will be delayed, needs samples for 1 week of  crestor and metoprolol

## 2010-12-14 NOTE — Telephone Encounter (Signed)
Patient called needing metoprolol succ 25mg  called to [facility] [place] mail order pharmacy will be late.Also needs samples of pradaxa due to late mail order.Samples of pradaxa left at front desk.

## 2011-04-30 ENCOUNTER — Telehealth: Payer: Self-pay | Admitting: Internal Medicine

## 2011-04-30 NOTE — Telephone Encounter (Signed)
Pt needs pradaxa refill, uses [facility] [place], pt changing pharmacies this time, pt out needs called in asap

## 2011-05-01 MED ORDER — DABIGATRAN ETEXILATE MESYLATE 150 MG PO CAPS: 150.0000 mg | ORAL_CAPSULE | Freq: Two times a day (BID) | ORAL | Status: DC

## 2011-05-01 NOTE — Telephone Encounter (Signed)
Called Pradaxa 150 mg -- bid -- qty 60 with 4 refills into [facility] [place].

## 2011-06-14 ENCOUNTER — Other Ambulatory Visit: Payer: Self-pay | Admitting: Internal Medicine

## 2011-06-14 MED ORDER — FLECAINIDE ACETATE 100 MG PO TABS: 100.0000 mg | ORAL_TABLET | Freq: Every day | ORAL | Status: DC

## 2011-06-14 MED ORDER — METOPROLOL SUCCINATE ER 25 MG PO TB24: 25.0000 mg | ORAL_TABLET | Freq: Every day | ORAL | Status: DC

## 2011-06-14 NOTE — Telephone Encounter (Signed)
..   Requested Prescriptions   Pending Prescriptions Disp Refills  . flecainide (TAMBOCOR) 100 MG tablet 60 tablet 0    Sig: Take 1 tablet (100 mg total) by mouth daily.  . metoprolol succinate (TOPROL-XL) 25 MG 24 hr tablet 30 tablet 0    Sig: Take 1 tablet (25 mg total) by mouth daily.

## 2011-06-14 NOTE — Telephone Encounter (Signed)
Refill - Metoprolol succinate (TOPROL-XL) 25 MG 24 hr tablet.          -Flecainide (TAMBOCOR) 100 MG tablet  Verifed preferred as [employer], [place] [address]

## 2011-06-28 ENCOUNTER — Encounter: Payer: Self-pay | Admitting: Internal Medicine

## 2011-06-28 ENCOUNTER — Ambulatory Visit (INDEPENDENT_AMBULATORY_CARE_PROVIDER_SITE_OTHER): Payer: 59 | Admitting: Internal Medicine

## 2011-06-28 VITALS — BP 104/64 | HR 56 | Ht 75.5 in | Wt 192.8 lb

## 2011-06-28 DIAGNOSIS — I4891 Unspecified atrial fibrillation: Secondary | ICD-10-CM

## 2011-06-28 NOTE — Patient Instructions (Signed)
Your physician wants you to follow-up in: 1 year with Dr. Taylor. You will receive a reminder letter in the mail two months in advance. If you don't receive a letter, please call our office to schedule the follow-up appointment.  

## 2011-06-28 NOTE — Progress Notes (Signed)
HPI [name] returns today for followup. He is a 66 year old man with paroxysmal atrial fibrillation, and a questionable history of a remote TIA. He is been maintained in sinus rhythm very nicely on flecainide 100 mg twice daily. He denies palpitations, chest pain, or shortness of breath. He remains active as a Engineer, structural competitively. He has had no syncope. With very extreme exertion, he will get mild dyspnea. This resolved immediately with rest. No peripheral edema. No cough or hemoptysis. Allergies  Allergen Reactions  . Penicillins     REACTION: hives     Current Outpatient Prescriptions  Medication Sig Dispense Refill  . atorvastatin (LIPITOR) 10 MG tablet Take 5 mg by mouth daily.        . dabigatran (PRADAXA) 150 MG CAPS Take 1 capsule (150 mg total) by mouth every 12 (twelve) hours.  60 capsule  4  . finasteride (PROSCAR) 5 MG tablet Take 5 mg by mouth daily.       . flecainide (TAMBOCOR) 100 MG tablet Take 1 tablet (100 mg total) by mouth daily.  60 tablet  0  . metoprolol succinate (TOPROL-XL) 25 MG 24 hr tablet TAKE 1 TABLET DAILY  90 tablet  4  . Multiple Vitamins-Minerals (OCUTABS PO) Take 1 tablet by mouth daily.        . Tamsulosin HCl (FLOMAX) 0.4 MG CAPS Take 0.4 mg by mouth 2 (two) times daily.       Marland Kitchen DISCONTD: metoprolol succinate (TOPROL-XL) 25 MG 24 hr tablet Take 1 tablet (25 mg total) by mouth daily.  30 tablet  0     Past Medical History  Diagnosis Date  . Hypercholesterolemia   . Atrial fibrillation   . Benign prostatic hypertrophy   . Memory loss     ROS:   All systems reviewed and negative except as noted in the HPI.   No past surgical history on file.   Family History  Problem Relation Age of Onset  . Hypertension Mother   . Hyperlipidemia Mother   . Arrhythmia Father   . Stroke Father     92s  . Hypertension Sister      History   Social History  . Marital Status: Married    Spouse Name: N/A    Number of Children: N/A  .  Years of Education: N/A   Occupational History  . psychologist    Social History Main Topics  . Smoking status: Never Smoker   . Smokeless tobacco: Not on file  . Alcohol Use: 8.4 oz/week    14 Glasses of wine per week  . Drug Use: No  . Sexually Active:    Other Topics Concern  . Not on file   Social History Narrative   Son is mentally handicapped;Mother has moved into the home     BP 104/64  Pulse 56  Ht 6' 3.5" (1.918 m)  Wt 192 lb 12.8 oz (87.454 kg)  BMI 23.78 kg/m2  Physical Exam:  Well appearing 66 year old man, NAD HEENT: Unremarkable Neck:  No JVD, no thyromegally Lungs:  Clear with no wheezes, rales, or rhonchi. HEART:  Regular rate rhythm, no murmurs, no rubs, no clicks Abd:  soft, positive bowel sounds, no organomegally, no rebound, no guarding Ext:  2 plus pulses, no edema, no cyanosis, no clubbing Skin:  No rashes no nodules Neuro:  CN II through XII intact, motor grossly intact  EKG Normal sinus rhythm with sinus bradycardia   Assess/Plan:

## 2011-06-28 NOTE — Assessment & Plan Note (Signed)
Today we discussed multiple issues including the necessity for systemic anticoagulation, the decision to continue flecainide, and beta blocker therapy. It seems as if the patient clearly had a TIA. This makes him at increased risk for stroke and for this reason I would recommend that he continue systemic anticoagulation. He is at low risk for bleeding. With regard of flecainide, he is maintaining sinus rhythm very nicely with essentially no symptoms and for this reason he will continue taking flecainide at the current dose. His blood pressure tends to run on the low side. We could consider discontinuing his beta blocker. For now however, he is able to do almost everything that he would like. He will undergo watchful waiting.

## 2011-07-09 ENCOUNTER — Telehealth: Payer: Self-pay | Admitting: Internal Medicine

## 2011-07-09 DIAGNOSIS — E7800 Pure hypercholesterolemia, unspecified: Secondary | ICD-10-CM

## 2011-07-09 MED ORDER — ATORVASTATIN CALCIUM 10 MG PO TABS: 5.0000 mg | ORAL_TABLET | Freq: Every day | ORAL | Status: DC

## 2011-07-09 NOTE — Addendum Note (Signed)
Addended by: Dennis Bast F on: 07/09/2011 12:48 PM   Modules accepted: Orders

## 2011-07-09 NOTE — Telephone Encounter (Signed)
Please return call to patient at [phone] regarding medication and referral.

## 2011-08-10 ENCOUNTER — Other Ambulatory Visit: Payer: Self-pay | Admitting: Internal Medicine

## 2011-09-19 ENCOUNTER — Other Ambulatory Visit: Payer: Self-pay | Admitting: Internal Medicine

## 2011-10-16 ENCOUNTER — Telehealth: Payer: Self-pay | Admitting: Internal Medicine

## 2011-10-16 NOTE — Telephone Encounter (Signed)
Pt calling re what allergy meds he can take?

## 2011-10-16 NOTE — Telephone Encounter (Signed)
Patient called was told can take allergy medication with no decongestant.States he will take plain claritin.

## 2011-10-18 ENCOUNTER — Telehealth: Payer: Self-pay | Admitting: Internal Medicine

## 2011-10-18 NOTE — Telephone Encounter (Signed)
Dr. Leone Payor according to last procedure, path report indicates 5 year recall .  Dr. Duaine Dredge is telling the patient to have colon in 3 .  Please review and advise

## 2011-10-18 NOTE — Telephone Encounter (Signed)
It is not unreasonable to do at 3 since he has hx adenomas and family hx colon cancer if patient desires (though 5 is also acceptable assuming no new anemia, signs/sxs)  He takes Pradaxa and would need clearance to come off that

## 2011-10-21 NOTE — Telephone Encounter (Signed)
The patient is advised.  He wants to wait and talk over with Dr. Waynard Edwards at his office visit next month.  He will call back if he wants to pursue

## 2011-11-30 ENCOUNTER — Other Ambulatory Visit: Payer: Self-pay | Admitting: Internal Medicine

## 2011-12-02 ENCOUNTER — Other Ambulatory Visit: Payer: Self-pay | Admitting: Internal Medicine

## 2011-12-17 ENCOUNTER — Other Ambulatory Visit: Payer: Self-pay | Admitting: Internal Medicine

## 2012-01-05 ENCOUNTER — Other Ambulatory Visit: Payer: Self-pay | Admitting: Internal Medicine

## 2012-01-14 ENCOUNTER — Other Ambulatory Visit: Payer: Self-pay | Admitting: Internal Medicine

## 2012-02-14 ENCOUNTER — Other Ambulatory Visit: Payer: Self-pay | Admitting: Cardiology

## 2012-02-14 MED ORDER — FLECAINIDE ACETATE 100 MG PO TABS: 100.0000 mg | ORAL_TABLET | Freq: Every day | ORAL | Status: DC

## 2012-04-12 ENCOUNTER — Other Ambulatory Visit: Payer: Self-pay | Admitting: Internal Medicine

## 2012-04-24 ENCOUNTER — Other Ambulatory Visit: Payer: Self-pay | Admitting: Internal Medicine

## 2012-05-28 ENCOUNTER — Telehealth: Payer: Self-pay | Admitting: Internal Medicine

## 2012-05-28 NOTE — Telephone Encounter (Signed)
New problem    Patient on praxada  150 mg switching to medicare    [facility] [address]  [phone].

## 2012-05-29 MED ORDER — DABIGATRAN ETEXILATE MESYLATE 150 MG PO CAPS: 150.0000 mg | ORAL_CAPSULE | Freq: Two times a day (BID) | ORAL | Status: DC

## 2012-05-29 NOTE — Telephone Encounter (Signed)
Patient Need appointment. Fax Received. Refill Completed. Duffy Dantonio Chowoe (R.M.A)

## 2012-06-26 ENCOUNTER — Other Ambulatory Visit: Payer: Self-pay | Admitting: Internal Medicine

## 2012-07-10 ENCOUNTER — Other Ambulatory Visit: Payer: Self-pay | Admitting: Internal Medicine

## 2012-07-10 NOTE — Telephone Encounter (Signed)
New Prob     States he has been on FLECAINIDE for some time now. States automatic refills do not work and is having some issues. Please call.

## 2012-07-25 ENCOUNTER — Other Ambulatory Visit: Payer: Self-pay | Admitting: Internal Medicine

## 2012-07-29 ENCOUNTER — Telehealth: Payer: Self-pay | Admitting: Internal Medicine

## 2012-07-29 NOTE — Telephone Encounter (Signed)
Spoke with patient who states he recently changed pharmacies and there is a problem with his Pradaxa Rx.  Patient states since he switched to [facility] he no longer gets refill notifications and he only has one pill left.  I went into EPIC to resend the patient's refill and discovered that the Rx could not be processed because the patient is overdue for his yearly ov.  Patient states he did not receive notification and did not realize it was time for an ov.  I advised patient that I will have to send refill request to Dr. Ladona Ridgel.  Patient is anxious to be seen as he does not want to have problems with other prescriptions.  I scheduled patient in the first available time slot which is Wed. July 16 at 2:30; patient verbalized understanding and gratitude and would like to know how to make certain he gets reminder appointment notices.  I advised patient to mark his calendar according to when Dr. Ladona Ridgel wants to see him back.  I am sending message to Dr. Ladona Ridgel for patient Rx refill request and I advised patient that our office would be back in touch once we received communication back from Dr. Ladona Ridgel.  Patient verbalized understanding.

## 2012-07-29 NOTE — Telephone Encounter (Signed)
New Prob      Pt states a mistake occurred with his prescription for PRADAXA. Please call.

## 2012-07-30 ENCOUNTER — Telehealth: Payer: Self-pay | Admitting: Nurse Practitioner

## 2012-07-30 MED ORDER — DABIGATRAN ETEXILATE MESYLATE 150 MG PO CAPS: 150.0000 mg | ORAL_CAPSULE | Freq: Two times a day (BID) | ORAL | Status: DC

## 2012-07-30 NOTE — Telephone Encounter (Signed)
Spoke with patient to inform him of Dr. Lubertha Basque approval to refill Pradaxa for one month which was sent to patient's pharmacy.  Patient verbalized understanding that he must be seen within the next month and states he will be here at scheduled appointment on 7/16 at 2:30.

## 2012-08-05 ENCOUNTER — Ambulatory Visit (INDEPENDENT_AMBULATORY_CARE_PROVIDER_SITE_OTHER): Payer: 59 | Admitting: Internal Medicine

## 2012-08-05 ENCOUNTER — Encounter: Payer: Self-pay | Admitting: Internal Medicine

## 2012-08-05 VITALS — BP 102/66 | HR 56 | Wt 190.0 lb

## 2012-08-05 DIAGNOSIS — I4891 Unspecified atrial fibrillation: Secondary | ICD-10-CM

## 2012-08-05 MED ORDER — ATORVASTATIN CALCIUM 10 MG PO TABS: ORAL_TABLET | ORAL | Status: DC

## 2012-08-05 MED ORDER — DABIGATRAN ETEXILATE MESYLATE 150 MG PO CAPS: 150.0000 mg | ORAL_CAPSULE | Freq: Two times a day (BID) | ORAL | Status: DC

## 2012-08-05 MED ORDER — METOPROLOL SUCCINATE ER 25 MG PO TB24: 12.5000 mg | ORAL_TABLET | Freq: Every day | ORAL | Status: DC

## 2012-08-05 MED ORDER — FLECAINIDE ACETATE 100 MG PO TABS: 100.0000 mg | ORAL_TABLET | Freq: Two times a day (BID) | ORAL | Status: DC

## 2012-08-05 NOTE — Progress Notes (Signed)
HPI Mr. Minus returns today for followup. He is a very pleasant 67 year old man with a history of paroxysmal atrial fibrillation, who is been maintained in sinus rhythm on flecainide. 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 He denies chest pain, shortness of breath, or syncope. He has rare palpitations. The patient has had some fatigue and malaise and overall has had some periods of tiredness. He has a history of TIAs in the past and has been maintained on systemic anticoagulation. Allergies  Allergen Reactions  . Penicillins     REACTION: hives     Current Outpatient Prescriptions  Medication Sig Dispense Refill  . atorvastatin (LIPITOR) 10 MG tablet Take 1/2 tablet daily  30 tablet  6  . dabigatran (PRADAXA) 150 MG CAPS Take 1 capsule (150 mg total) by mouth every 12 (twelve) hours.  60 capsule  11  . finasteride (PROSCAR) 5 MG tablet Take 5 mg by mouth daily.       . flecainide (TAMBOCOR) 100 MG tablet Take 1 tablet (100 mg total) by mouth 2 (two) times daily.  60 tablet  11  . metoprolol succinate (TOPROL-XL) 25 MG 24 hr tablet Take 0.5 tablets (12.5 mg total) by mouth daily.  30 tablet  6  . Multiple Vitamins-Minerals (OCUTABS PO) Take 1 tablet by mouth daily.        . Tamsulosin HCl (FLOMAX) 0.4 MG CAPS Take 0.4 mg by mouth 2 (two) times daily.        No current facility-administered medications for this visit.     Past Medical History  Diagnosis Date  . Hypercholesterolemia   . Atrial fibrillation   . Benign prostatic hypertrophy   . Memory loss     ROS:   All systems reviewed and negative except as noted in the HPI.   History reviewed. No pertinent past surgical history.   Family History  Problem Relation Age of Onset  . Hypertension Mother   . Hyperlipidemia Mother   . Arrhythmia Father   . Stroke Father     42s  . Hypertension Sister      History   Social History  . Marital Status: Married    Spouse Name: N/A    Number of Children: N/A  . Years of Education: N/A   Occupational History  . psychologist    Social History Main Topics  . Smoking status: Never Smoker   . Smokeless tobacco: Not on file  . Alcohol Use: 8.4 oz/week    14 Glasses of wine per week  . Drug Use: No  . Sexually Active:    Other Topics Concern  . Not on file   Social History Narrative   Son is mentally handicapped;   Mother has moved into the home     BP 102/66  Pulse 56  Wt 190 lb (86.183 kg)  BMI 23.43 kg/m2  Physical Exam:  Well appearing 67 year old man, NAD HEENT: Unremarkable Neck:  7cm JVD, no thyromegally Back:  No CVA tenderness Lungs:  Clear with no wheezes, rales, or rhonchi. HEART:  Regular rate rhythm, no murmurs, no rubs, no clicks Abd:  soft, positive bowel sounds, no organomegally, no rebound, no guarding Ext:  2 plus pulses, no edema, no cyanosis, no clubbing Skin:  No rashes no nodules Neuro:  CN II through XII intact, motor grossly intact  EKG - sinus bradycardia   Assess/Plan:

## 2012-08-05 NOTE — Patient Instructions (Addendum)
Your physician wants you to follow-up in: 12 months with Dr Court Joy will receive a reminder letter in the mail two months in advance. If you don't receive a letter, please call our office to schedule the follow-up appointment.   Your physician has recommended you make the following change in your medication:  1) Decrease Metoprolol to 1/2 tablet daily 12.5mg 

## 2012-08-05 NOTE — Assessment & Plan Note (Signed)
He is maintaining sinus rhythm very nicely on flecainide. He has had some weakness and fatigue. He is bradycardic. I've asked the patient to reduce his dose of Toprol to a half tablet daily.

## 2012-08-26 ENCOUNTER — Other Ambulatory Visit: Payer: Self-pay

## 2012-09-18 ENCOUNTER — Telehealth: Payer: Self-pay | Admitting: Internal Medicine

## 2012-09-18 NOTE — Telephone Encounter (Signed)
New Problem  Medication Inquiry  Pt wants to know if he can switch from Janee Morn to Zertec because he is having a bad allergy attack... he wants to be sure that it will be ok to take zertec with the medication that he is on.

## 2012-09-23 NOTE — Telephone Encounter (Signed)
Left message again for patient to return my call.

## 2012-09-23 NOTE — Telephone Encounter (Signed)
Left message for patient to return my call in regards to the Claritin and Zyrtec

## 2012-09-23 NOTE — Telephone Encounter (Signed)
Follow up  ° ° ° ° °Pt is returning your call  °

## 2012-10-19 ENCOUNTER — Other Ambulatory Visit: Payer: Self-pay | Admitting: Internal Medicine

## 2012-10-19 NOTE — Telephone Encounter (Signed)
Have left multiple messages for patient to return my call and he has not done so.  Let him know to call me if needed

## 2012-11-26 ENCOUNTER — Other Ambulatory Visit: Payer: Self-pay

## 2013-01-01 ENCOUNTER — Telehealth: Payer: Self-pay | Admitting: Internal Medicine

## 2013-01-01 NOTE — Telephone Encounter (Signed)
New Problem  Pt has had episodes with afib// going out of town//request a call back to discuss same day appt//SR

## 2013-01-01 NOTE — Telephone Encounter (Signed)
Left message for patient to return my call.

## 2013-01-04 ENCOUNTER — Other Ambulatory Visit: Payer: Self-pay | Admitting: Internal Medicine

## 2013-01-11 NOTE — Telephone Encounter (Signed)
Left message again to call me in regards to him being out of rhythm and going out of town.  I have left him a message previously and heard nothing back.  I let him know if he needs me to call me back

## 2013-07-01 ENCOUNTER — Other Ambulatory Visit: Payer: Self-pay | Admitting: Internal Medicine

## 2013-07-02 ENCOUNTER — Other Ambulatory Visit: Payer: Self-pay | Admitting: Internal Medicine

## 2013-07-06 ENCOUNTER — Other Ambulatory Visit: Payer: Self-pay | Admitting: Urology

## 2013-07-06 ENCOUNTER — Telehealth: Payer: Self-pay | Admitting: Internal Medicine

## 2013-07-06 NOTE — Telephone Encounter (Signed)
Spoke with Pam and let her know Dr Lovena Le would be in the office Thurs to address and I will send her a note

## 2013-07-06 NOTE — Telephone Encounter (Signed)
New message    Can they hold prodaxa prior to TURP on July 17th.  (How many days prior to hold medication)

## 2013-07-09 NOTE — Telephone Encounter (Signed)
Discussed with Dr Lovena Le, okay to proceed with TURP.  May hold Pradaxa 48 hours before surgery(TURP) and restart Pradaxa at MD doing procedures discretion.  Peak action is 4 hours after initiation.

## 2013-07-21 ENCOUNTER — Encounter (HOSPITAL_COMMUNITY): Payer: Self-pay | Admitting: Pharmacy Technician

## 2013-07-29 ENCOUNTER — Other Ambulatory Visit (HOSPITAL_COMMUNITY): Payer: Self-pay | Admitting: *Deleted

## 2013-07-29 NOTE — Patient Instructions (Addendum)
Joel [name]  07/29/2013                           YOUR PROCEDURE IS SCHEDULED ON: 08/06/13 AT 9:00 AM               ENTER [place] ENTRANCE AND                            FOLLOW  SIGNS TO SHORT STAY CENTER                 ARRIVE AT SHORT STAY AT: 7:00 AM               CALL THIS NUMBER IF ANY PROBLEMS THE DAY OF SURGERY :               [phone]                                REMEMBER:   Do not eat food or drink liquids AFTER MIDNIGHT                 Take these medicines the morning of surgery with               A SIPS OF WATER :  FINESTERIDE / FLECAINIDE       Do not wear jewelry, make-up   Do not wear lotions, powders, or perfumes.   Do not shave legs or underarms 12 hrs. before surgery (men may shave face)  Do not bring valuables to the hospital.  Contacts, dentures or bridgework may not be worn into surgery.  Leave suitcase in the car. After surgery it may be brought to your room.  For patients admitted to the hospital more than one night, checkout time is            11:00 AM                                                       The day of discharge.   Patients discharged the day of surgery will not be allowed to drive home.            If going home same day of surgery, must have someone stay with you              FIRST 24 hrs at home and arrange for some one to drive you              home from hospital.   ________________________________________________________________________                                                                        [place]  Before surgery, you can play an important role.  Because skin is not sterile, your skin needs to be as free of germs as possible.  You can reduce the number of germs on  your skin by washing with CHG (chlorahexidine gluconate) soap before surgery.  CHG is an antiseptic cleaner which kills germs and bonds with the skin to continue killing germs even  after washing. Please DO NOT use if you have an allergy to CHG or antibacterial soaps.  If your skin becomes reddened/irritated stop using the CHG and inform your nurse when you arrive at Short Stay. Do not shave (including legs and underarms) for at least 48 hours prior to the first CHG shower.  You may shave your face. Please follow these instructions carefully:   1.  Shower with CHG Soap the night before surgery and the  morning of Surgery.   2.  If you choose to wash your hair, wash your hair first as usual with your  normal  Shampoo.   3.  After you shampoo, rinse your hair and body thoroughly to remove the  shampoo.                                         4.  Use CHG as you would any other liquid soap.  You can apply chg directly  to the skin and wash . Gently wash with scrungie or clean wascloth    5.  Apply the CHG Soap to your body ONLY FROM THE NECK DOWN.   Do not use on open                           Wound or open sores. Avoid contact with eyes, ears mouth and genitals (private parts).                        Genitals (private parts) with your normal soap.              6.  Wash thoroughly, paying special attention to the area where your surgery  will be performed.   7.  Thoroughly rinse your body with warm water from the neck down.   8.  DO NOT shower/wash with your normal soap after using and rinsing off  the CHG Soap .                9.  Pat yourself dry with a clean towel.             10.  Wear clean pajamas.             11.  Place clean sheets on your bed the night of your first shower and do not  sleep with pets.  Day of Surgery : Do not apply any lotions/deodorants the morning of surgery.  Please wear clean clothes to the hospital/surgery center.  FAILURE TO FOLLOW THESE INSTRUCTIONS MAY RESULT IN THE CANCELLATION OF YOUR SURGERY    PATIENT SIGNATURE_________________________________  ______________________________________________________________________

## 2013-08-02 ENCOUNTER — Ambulatory Visit (HOSPITAL_COMMUNITY)
Admission: RE | Admit: 2013-08-02 | Discharge: 2013-08-02 | Disposition: A | Discharge status: Home / Self Care | Payer: Medicare Other | Source: Ambulatory Visit | Attending: Anesthesiology | Admitting: Anesthesiology

## 2013-08-02 ENCOUNTER — Encounter (HOSPITAL_COMMUNITY): Payer: Self-pay

## 2013-08-02 ENCOUNTER — Encounter (HOSPITAL_COMMUNITY)
Admission: RE | Admit: 2013-08-02 | Discharge: 2013-08-02 | Disposition: A | Discharge status: Home / Self Care | Payer: Medicare Other | Source: Ambulatory Visit | Attending: Urology | Admitting: Urology

## 2013-08-02 DIAGNOSIS — Z01812 Encounter for preprocedural laboratory examination: Secondary | ICD-10-CM | POA: Insufficient documentation

## 2013-08-02 DIAGNOSIS — J438 Other emphysema: Secondary | ICD-10-CM | POA: Insufficient documentation

## 2013-08-02 DIAGNOSIS — Z981 Arthrodesis status: Secondary | ICD-10-CM | POA: Insufficient documentation

## 2013-08-02 DIAGNOSIS — I10 Essential (primary) hypertension: Secondary | ICD-10-CM | POA: Insufficient documentation

## 2013-08-02 DIAGNOSIS — Z01810 Encounter for preprocedural cardiovascular examination: Secondary | ICD-10-CM | POA: Insufficient documentation

## 2013-08-02 DIAGNOSIS — I4891 Unspecified atrial fibrillation: Secondary | ICD-10-CM | POA: Insufficient documentation

## 2013-08-02 DIAGNOSIS — Z87891 Personal history of nicotine dependence: Secondary | ICD-10-CM | POA: Insufficient documentation

## 2013-08-02 DIAGNOSIS — Z01818 Encounter for other preprocedural examination: Secondary | ICD-10-CM | POA: Insufficient documentation

## 2013-08-02 HISTORY — DX: Gastro-esophageal reflux disease without esophagitis: K21.9

## 2013-08-02 HISTORY — DX: Unspecified osteoarthritis, unspecified site: M19.90

## 2013-08-02 HISTORY — DX: Nocturia: R35.1

## 2013-08-02 HISTORY — DX: Family history of other specified conditions: Z84.89

## 2013-08-02 HISTORY — DX: Anxiety disorder, unspecified: F41.9

## 2013-08-02 LAB — BASIC METABOLIC PANEL
ANION GAP: 10 (ref 5–15)
BUN: 13 mg/dL (ref 6–23)
CALCIUM: 9.5 mg/dL (ref 8.4–10.5)
CO2: 27 mEq/L (ref 19–32)
Chloride: 104 mEq/L (ref 96–112)
Creatinine, Ser: 0.83 mg/dL (ref 0.50–1.35)
GFR calc Af Amer: >90 mL/min (ref >90)
GFR, EST NON AFRICAN AMERICAN: 90 mL/min — AB (ref >90)
Glucose, Bld: 82 mg/dL (ref 70–99)
Potassium: 4.5 mEq/L (ref 3.7–5.3)
SODIUM: 141 meq/L (ref 137–147)

## 2013-08-02 LAB — CBC
HCT: 43.4 % (ref 39.0–52.0)
Hemoglobin: 14.9 g/dL (ref 13.0–17.0)
MCH: 33 pg (ref 26.0–34.0)
MCHC: 34.3 g/dL (ref 30.0–36.0)
MCV: 96 fL (ref 78.0–100.0)
PLATELETS: 224 10*3/uL (ref 150–400)
RBC: 4.52 MIL/uL (ref 4.22–5.81)
RDW: 12.7 % (ref 11.5–15.5)
WBC: 9.7 10*3/uL (ref 4.0–10.5)

## 2013-08-02 NOTE — Progress Notes (Signed)
08/02/13 1027  [place]  Have you ever been diagnosed with sleep apnea through a sleep study? No  Do you snore loudly (loud enough to be heard through closed doors)?  0  Do you often feel tired, fatigued, or sleepy during the daytime? 1  Has anyone observed you stop breathing during your sleep? 0  Do you have, or are you being treated for high blood pressure? 0  BMI more than 35 kg/m2? 0  Age over 66 years old? 1  Neck circumference greater than 40 cm/16 inches? 1  Gender: 1  Obstructive Sleep Apnea Score 4  Score 4 or greater  Results sent to PCP

## 2013-08-03 NOTE — Progress Notes (Signed)
EKG reviewed by Dr. Kalman Shan - request pt to not take pm METOPROLOL the night before surgery - pt notied

## 2013-08-06 ENCOUNTER — Ambulatory Visit (HOSPITAL_COMMUNITY)
Admission: RE | Admit: 2013-08-06 | Discharge: 2013-08-06 | Disposition: A | Discharge status: Home / Self Care | Payer: Medicare Other | Source: Ambulatory Visit | Attending: Urology | Admitting: Urology

## 2013-08-06 ENCOUNTER — Encounter (HOSPITAL_COMMUNITY): Payer: Medicare Other | Admitting: Certified Registered Nurse Anesthetist

## 2013-08-06 ENCOUNTER — Encounter (HOSPITAL_COMMUNITY)
Admission: RE | Disposition: A | Discharge status: Home / Self Care | Payer: Self-pay | Source: Ambulatory Visit | Attending: Urology

## 2013-08-06 ENCOUNTER — Encounter (HOSPITAL_COMMUNITY): Payer: Self-pay | Admitting: *Deleted

## 2013-08-06 ENCOUNTER — Ambulatory Visit (HOSPITAL_COMMUNITY): Payer: Medicare Other | Admitting: Certified Registered Nurse Anesthetist

## 2013-08-06 DIAGNOSIS — F411 Generalized anxiety disorder: Secondary | ICD-10-CM | POA: Diagnosis not present

## 2013-08-06 DIAGNOSIS — N420 Calculus of prostate: Secondary | ICD-10-CM | POA: Insufficient documentation

## 2013-08-06 DIAGNOSIS — Z87891 Personal history of nicotine dependence: Secondary | ICD-10-CM | POA: Diagnosis not present

## 2013-08-06 DIAGNOSIS — I4891 Unspecified atrial fibrillation: Secondary | ICD-10-CM | POA: Insufficient documentation

## 2013-08-06 DIAGNOSIS — N139 Obstructive and reflux uropathy, unspecified: Secondary | ICD-10-CM | POA: Insufficient documentation

## 2013-08-06 DIAGNOSIS — K219 Gastro-esophageal reflux disease without esophagitis: Secondary | ICD-10-CM | POA: Diagnosis not present

## 2013-08-06 DIAGNOSIS — E291 Testicular hypofunction: Secondary | ICD-10-CM | POA: Insufficient documentation

## 2013-08-06 DIAGNOSIS — Z87898 Personal history of other specified conditions: Secondary | ICD-10-CM

## 2013-08-06 DIAGNOSIS — N401 Enlarged prostate with lower urinary tract symptoms: Secondary | ICD-10-CM | POA: Diagnosis not present

## 2013-08-06 DIAGNOSIS — N210 Calculus in bladder: Secondary | ICD-10-CM | POA: Insufficient documentation

## 2013-08-06 HISTORY — PX: TRANSURETHRAL RESECTION OF PROSTATE: SHX73

## 2013-08-06 SURGERY — TURP (TRANSURETHRAL RESECTION OF PROSTATE)
Anesthesia: General

## 2013-08-06 MED ORDER — DEXAMETHASONE SODIUM PHOSPHATE 10 MG/ML IJ SOLN
INTRAMUSCULAR | Status: AC
  Filled 2013-08-06: qty 1

## 2013-08-06 MED ORDER — OXYCODONE HCL 5 MG PO TABS
5.0000 mg | ORAL_TABLET | Freq: Once | ORAL | Status: AC | PRN
  Administered 2013-08-06: 5 mg via ORAL
  Filled 2013-08-06: qty 1

## 2013-08-06 MED ORDER — FENTANYL CITRATE 0.05 MG/ML IJ SOLN
INTRAMUSCULAR | Status: AC
  Filled 2013-08-06: qty 2

## 2013-08-06 MED ORDER — PHENAZOPYRIDINE HCL 200 MG PO TABS: 200.0000 mg | ORAL_TABLET | Freq: Three times a day (TID) | ORAL | Status: DC | PRN

## 2013-08-06 MED ORDER — MEPERIDINE HCL 50 MG/ML IJ SOLN: 6.2500 mg | INTRAMUSCULAR | Status: DC | PRN

## 2013-08-06 MED ORDER — LIDOCAINE HCL (CARDIAC) 20 MG/ML IV SOLN
INTRAVENOUS | Status: AC
  Filled 2013-08-06: qty 5

## 2013-08-06 MED ORDER — BELLADONNA ALKALOIDS-OPIUM 16.2-60 MG RE SUPP
RECTAL | Status: DC | PRN
  Administered 2013-08-06: 1 via RECTAL

## 2013-08-06 MED ORDER — LIDOCAINE HCL (CARDIAC) 20 MG/ML IV SOLN
INTRAVENOUS | Status: DC | PRN
  Administered 2013-08-06: 100 mg via INTRAVENOUS

## 2013-08-06 MED ORDER — KETOROLAC TROMETHAMINE 30 MG/ML IJ SOLN
INTRAMUSCULAR | Status: AC
  Filled 2013-08-06: qty 1

## 2013-08-06 MED ORDER — PROPOFOL 10 MG/ML IV BOLUS
INTRAVENOUS | Status: DC | PRN
  Administered 2013-08-06: 200 mg via INTRAVENOUS

## 2013-08-06 MED ORDER — CIPROFLOXACIN IN D5W 400 MG/200ML IV SOLN
400.0000 mg | INTRAVENOUS | Status: AC
  Administered 2013-08-06: 400 mg via INTRAVENOUS

## 2013-08-06 MED ORDER — SODIUM CHLORIDE 0.9 % IR SOLN
Status: DC | PRN
  Administered 2013-08-06: 12000 mL via INTRAVESICAL

## 2013-08-06 MED ORDER — CIPROFLOXACIN HCL 500 MG PO TABS: 500.0000 mg | ORAL_TABLET | Freq: Two times a day (BID) | ORAL | Status: DC

## 2013-08-06 MED ORDER — BELLADONNA ALKALOIDS-OPIUM 16.2-60 MG RE SUPP
RECTAL | Status: AC
  Filled 2013-08-06: qty 1

## 2013-08-06 MED ORDER — MIDAZOLAM HCL 5 MG/5ML IJ SOLN
INTRAMUSCULAR | Status: DC | PRN
  Administered 2013-08-06: 2 mg via INTRAVENOUS

## 2013-08-06 MED ORDER — HYDROMORPHONE HCL PF 1 MG/ML IJ SOLN: 0.2500 mg | INTRAMUSCULAR | Status: DC | PRN

## 2013-08-06 MED ORDER — FENTANYL CITRATE 0.05 MG/ML IJ SOLN
INTRAMUSCULAR | Status: DC | PRN
  Administered 2013-08-06 (×2): 50 ug via INTRAVENOUS

## 2013-08-06 MED ORDER — FINASTERIDE 5 MG PO TABS: 5.0000 mg | ORAL_TABLET | Freq: Every day | ORAL | Status: DC

## 2013-08-06 MED ORDER — OXYCODONE-ACETAMINOPHEN 5-325 MG PO TABS: 1.0000 | ORAL_TABLET | ORAL | Status: DC | PRN

## 2013-08-06 MED ORDER — OXYCODONE HCL 5 MG/5ML PO SOLN
5.0000 mg | Freq: Once | ORAL | Status: AC | PRN
  Filled 2013-08-06: qty 5

## 2013-08-06 MED ORDER — LACTATED RINGERS IV SOLN
INTRAVENOUS | Status: DC
  Administered 2013-08-06: 1000 mL via INTRAVENOUS

## 2013-08-06 MED ORDER — PROMETHAZINE HCL 25 MG/ML IJ SOLN: 6.2500 mg | INTRAMUSCULAR | Status: DC | PRN

## 2013-08-06 MED ORDER — ONDANSETRON HCL 4 MG/2ML IJ SOLN
INTRAMUSCULAR | Status: AC
  Filled 2013-08-06: qty 2

## 2013-08-06 MED ORDER — EPHEDRINE SULFATE 50 MG/ML IJ SOLN
INTRAMUSCULAR | Status: DC | PRN
  Administered 2013-08-06: 10 mg via INTRAVENOUS
  Administered 2013-08-06: 5 mg via INTRAVENOUS

## 2013-08-06 MED ORDER — ONDANSETRON HCL 4 MG/2ML IJ SOLN
INTRAMUSCULAR | Status: DC | PRN
  Administered 2013-08-06: 4 mg via INTRAVENOUS

## 2013-08-06 MED ORDER — MIDAZOLAM HCL 2 MG/2ML IJ SOLN
INTRAMUSCULAR | Status: AC
  Filled 2013-08-06: qty 2

## 2013-08-06 MED ORDER — KETOROLAC TROMETHAMINE 30 MG/ML IJ SOLN
INTRAMUSCULAR | Status: DC | PRN
  Administered 2013-08-06: 30 mg via INTRAVENOUS

## 2013-08-06 MED ORDER — ACETAMINOPHEN 10 MG/ML IV SOLN
1000.0000 mg | Freq: Once | INTRAVENOUS | Status: AC
  Administered 2013-08-06: 1000 mg via INTRAVENOUS
  Filled 2013-08-06: qty 100

## 2013-08-06 MED ORDER — DEXAMETHASONE SODIUM PHOSPHATE 4 MG/ML IJ SOLN
INTRAMUSCULAR | Status: DC | PRN
  Administered 2013-08-06: 10 mg via INTRAVENOUS

## 2013-08-06 MED ORDER — CIPROFLOXACIN IN D5W 400 MG/200ML IV SOLN
INTRAVENOUS | Status: AC
  Filled 2013-08-06: qty 200

## 2013-08-06 MED ORDER — PROPOFOL 10 MG/ML IV BOLUS
INTRAVENOUS | Status: AC
  Filled 2013-08-06: qty 20

## 2013-08-06 SURGICAL SUPPLY — 23 items
BAG URINE DRAINAGE (UROLOGICAL SUPPLIES) ×1 IMPLANT
BAG URO CATCHER STRL LF (DRAPE) ×2 IMPLANT
CATH AINSWORTH 30CC 24FR (CATHETERS) IMPLANT
CATH FOLEY 3WAY 30CC 24FR (CATHETERS)
CATH HEMA 3WAY 30CC 22FR COUDE (CATHETERS) ×1 IMPLANT
CATH URO 16X24FR 3W FL PS (CATHETERS) IMPLANT
CLOTH BEACON ORANGE TIMEOUT ST (SAFETY) ×2 IMPLANT
DRAPE CAMERA CLOSED 9X96 (DRAPES) ×2 IMPLANT
ELECT BUTTON HF 24-28F 2 30DE (ELECTRODE) ×1 IMPLANT
ELECT LOOP MED HF 24F 12D (CUTTING LOOP) ×1 IMPLANT
ELECT LOOP MED HF 24F 12D CBL (CLIP) ×2 IMPLANT
ELECT RESECT VAPORIZE 12D CBL (ELECTRODE) IMPLANT
GLOVE BIOGEL M STRL SZ7.5 (GLOVE) ×4 IMPLANT
GOWN STRL REUS W/TWL LRG LVL3 (GOWN DISPOSABLE) ×2 IMPLANT
GOWN STRL REUS W/TWL XL LVL3 (GOWN DISPOSABLE) ×2 IMPLANT
HOLDER FOLEY CATH W/STRAP (MISCELLANEOUS) ×1 IMPLANT
KIT ASPIRATION TUBING (SET/KITS/TRAYS/PACK) ×2 IMPLANT
MANIFOLD NEPTUNE II (INSTRUMENTS) ×2 IMPLANT
NS IRRIG 1000ML POUR BTL (IV SOLUTION) ×2 IMPLANT
PACK CYSTO (CUSTOM PROCEDURE TRAY) ×2 IMPLANT
SYR 30ML LL (SYRINGE) ×1 IMPLANT
SYRINGE IRR TOOMEY STRL 70CC (SYRINGE) ×2 IMPLANT
TUBING CONNECTING 10 (TUBING) ×2 IMPLANT

## 2013-08-06 NOTE — Anesthesia Postprocedure Evaluation (Signed)
Anesthesia Post Note  Patient: Joel [name]  Procedure(s) Performed: Procedure(s) (LRB): TRANSURETHRAL RESECTION OF THE PROSTATE (TURP) WITH GYRUS, irrigation of bladder stone, cystoscope (N/A)  Anesthesia type: General  Patient location: PACU  Post pain: Pain level controlled  Post assessment: Post-op Vital signs reviewed  Last Vitals: BP 118/69  Pulse 58  Temp(Src) 36.4 C (Oral)  Resp 14  SpO2 99%  Post vital signs: Reviewed  Level of consciousness: sedated  Complications: No apparent anesthesia complications

## 2013-08-06 NOTE — H&P (Signed)
Reason For Visit 4 mo f/u, cysto & PUS   Active Problems Problems  1. Benign localized hyperplasia of prostate with urinary obstruction  (381.82,993.71) 2. 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 Currently taking Rapaflo/finesteride. Originally referred back by Dr. Joylene Draft for further evaluation of voiding issues. He is having frequency, nocturia, straining to void & a weak stream. He has an IPSS=25/7, despite 2 Flomax hs and 1 Finasteride. PSA 0.795, and repeat 0.622.      He is on flecainide for A. Fib. Possible TIA recently. He remains on Pradaxa.     12/04/12 PSA - 0.622  04/06/12 PSA - 0.795   Past Medical History Problems  1. History of arthritis (V13.4) 2. History of asthma (V12.69) 3. History of atrial fibrillation (V12.59) 4. History of cardiac arrhythmia (V12.59) 5. History of gastroesophageal reflux (GERD) (V12.79) 6. History of hypercholesterolemia (V12.29) 7. History of hyperlipidemia (V12.29) 8. History of transient cerebral ischemia (V12.54)  Surgical History Problems  1. History of Appendectomy 2. History of Back Surgery 3. History of Surgery Of Male Genitalia Vasectomy  Current Meds 1. Atorvastatin Calcium TABS;  Therapy: (IRCVELFY:10FBP1025) to Recorded 2. Finasteride 5 MG Oral Tablet;  Therapy: (ENIDPOEU:23NTI1443) to Recorded 3. Flecainide Acetate 100 MG Oral Tablet;  Therapy: (XVQMGQQP:61PJK9326) to Recorded 4. Metoprolol Succinate ER TB24;  Therapy: (Recorded:06Jan2015) to Recorded 5. Pradaxa 150 MG Oral Capsule;  Therapy: (ZTIWPYKD:98PJA2505) to Recorded 6. PriLOSEC PACK;  Therapy: (LZJQBHAL:93XTK2409) to Recorded 7. Rapaflo 8 MG Oral Capsule; TAKE ONE CAPSULE BY MOUTH EVERY  DAY WITH FOOD;  Therapy: 73ZHG9924 to (Evaluate:16Jul2015)  Requested for: 26STM1962;  Last Rx:18Mar2015 Ordered  Allergies Medication  1.  Penicillins  Family History Problems  1. Family history of stroke (V17.1) : Father  Social History Problems  1. Alcohol use 2. Denied: History of Caffeine use 3. Father deceased 82. Former smoker (V15.82) 5. Former smoker (V15.82)   1 1/2 ppd 6. Married 7. Mother alive 62. Denied: History of Number of children 9. Occupation   Engineer, water 10. Step son   1  Review of Systems Genitourinary, constitutional, skin, eye, otolaryngeal, hematologic/lymphatic, cardiovascular, pulmonary, endocrine, musculoskeletal, gastrointestinal, neurological and psychiatric system(s) were reviewed and pertinent findings if present are noted.  Genitourinary: urinary frequency, nocturia, weak urinary stream and initiating urination requires straining, but no difficulty starting the urinary stream, urinary stream does not start and stop and no incomplete emptying of bladder.  Gastrointestinal: diarrhea, but no nausea, no vomiting, no heartburn and no constipation.  Constitutional: no fever, no night sweats, not feeling tired (fatigue) and no recent weight loss.  Integumentary: no new skin rashes or lesions and no pruritus.  Eyes: no blurred vision and no diplopia.  ENT: sinus problems, but no sore throat.  Hematologic/Lymphatic: no tendency to easily bruise and no swollen glands.  Cardiovascular: no chest pain and no leg swelling.  Respiratory: no shortness of breath and no cough.  Endocrine: no polydipsia.  Musculoskeletal: joint pain, but no back pain.  Neurological: no headache and no dizziness.  Psychiatric: no anxiety and no depression.    Vitals Vital Signs [Data Includes: Last 1 Day]  Recorded: [id number] 03:04PM  Blood Pressure: 120 / 76 Temperature: 98.4 F Heart Rate: 55  Physical Exam Constitutional: Well nourished and well developed . No acute distress.  ENT:. The ears and nose are normal in appearance.  Neck: The appearance of  the neck is normal and no neck mass is present.   Pulmonary: No respiratory distress and normal respiratory rhythm and effort.  Cardiovascular: Heart rate and rhythm are normal . No peripheral edema.  Abdomen: The abdomen is soft and nontender. No masses are palpated. No CVA tenderness. No hernias are palpable. No hepatosplenomegaly noted.  Rectal: Rectal exam demonstrates normal sphincter tone, the anus is normal on inspection. and no residual hemorrhoidal skin tags seen. Estimated prostate size is 4+. Normal rectal tone, no rectal masses, prostate is smooth, symmetric and non-tender. The perineum is normal on inspection.  Genitourinary: Examination of the penis demonstrates no discharge, no masses, no lesions and a normal meatus. The scrotum is without lesions. The right epididymis is palpably normal and non-tender. The left epididymis is palpably normal and non-tender. The right testis is non-tender and without masses. The left testis is non-tender and without masses.  Lymphatics: The femoral and inguinal nodes are not enlarged or tender.  Skin: Normal skin turgor, no visible rash and no visible skin lesions.  Neuro/Psych:. Mood and affect are appropriate.    Results/Data Urine [Data Includes: Last 1 Day]   [id number]  COLOR YELLOW   APPEARANCE CLEAR   SPECIFIC GRAVITY 1.010   pH 6.0   GLUCOSE NEG mg/dL  BILIRUBIN NEG   KETONE NEG mg/dL  BLOOD SMALL   PROTEIN NEG mg/dL  UROBILINOGEN 0.2 mg/dL  NITRITE NEG   LEUKOCYTE ESTERASE NEG   SQUAMOUS EPITHELIAL/HPF RARE   WBC NONE SEEN WBC/hpf  RBC NONE SEEN RBC/hpf  BACTERIA NONE SEEN   CRYSTALS NONE SEEN   CASTS NONE SEEN    Procedure Prostate u/s today: Length - 5.29cm, Height - 4.05cm and Width - 4.42cm. Total volume - 49.61 grams. Multiple calcifications noted.   Procedure: Cystoscopy  Chaperone Present: Margarita Grizzle.  Indication: Lower Urinary Tract Symptoms.  Informed Consent: Risks, benefits, and potential adverse events were discussed and informed consent was obtained from the  patient.  Prep: The patient was prepped with betadine.  Anesthesia:. Local anesthesia was administered intraurethrally with 2% lidocaine jelly.  Antibiotic prophylaxis: Ciprofloxacin.  Procedure Note:  Urethral meatus:. No abnormalities.  Anterior urethra: No abnormalities.  Prostatic urethra:. The lateral prostatic lobes were enlarged. An enlarged intravesical median lobe was visualized.  Bladder: Visulization was clear. The ureteral orifices were in the normal anatomic position bilaterally. A systematic survey of the bladder demonstrated no bladder tumors or stones. Examination of the bladder demonstrated no clot within the bladder, no trabeculation and no diverticulum no fistula, no erythematous mucosa, no ulcer, no edema and no cellules. The patient tolerated the procedure well.  Complications: None.    Assessment Assessed  1. Benign localized hyperplasia of prostate with urinary obstruction  (024.09,735.32)  68 yo male with prostatic median lobe ball-valve causing bladder outlet obstructive symptoms. He has a hx of Atrial fibrillation, on Flecainide, Metoprolol, and Pradaxa ( Dr. Crissie Sickles). He could have TUNA, but will have better result from TURP of median lobe ball valve, as out-patient.   Plan Benign localized hyperplasia of prostate with urinary obstruction  1. Follow-up Office  Follow-up: for TURP in November  Status: Hold For -  Date of Service  Requested for: [phone]  TURP for median lobe ball valve as outpatient.   Discussion/Summary cc: Crist Infante, MD   cc: Dr. Crissie Sickles     Signatures Electronically signed by : Carolan Clines, M.D.; Jun 29 2013  5:49PM EST

## 2013-08-06 NOTE — Anesthesia Preprocedure Evaluation (Addendum)
Anesthesia Evaluation  Patient identified by MRN, date of birth, ID band Patient awake    Reviewed: Allergy & Precautions, H&P , NPO status , Patient's Chart, lab work & pertinent test results, reviewed documented beta blocker date and time   History of Anesthesia Complications Negative for: history of anesthetic complications  Airway Mallampati: II TM Distance: >3 FB Neck ROM: Full    Dental  (+) Dental Advisory Given   Pulmonary former smoker,  breath sounds clear to auscultation        Cardiovascular Pt. on home beta blockers + dysrhythmias Atrial Fibrillation Rhythm:Regular Rate:Normal     Neuro/Psych PSYCHIATRIC DISORDERS Anxiety negative neurological ROS     GI/Hepatic Neg liver ROS, GERD-  ,  Endo/Other  negative endocrine ROS  Renal/GU negative Renal ROS     Musculoskeletal negative musculoskeletal ROS (+)   Abdominal   Peds  Hematology negative hematology ROS (+)   Anesthesia Other Findings   Reproductive/Obstetrics negative OB ROS                           Anesthesia Physical Anesthesia Plan  ASA: II  Anesthesia Plan: General   Post-op Pain Management:    Induction: Intravenous  Airway Management Planned: LMA  Additional Equipment:   Intra-op Plan:   Post-operative Plan: Extubation in OR  Informed Consent: I have reviewed the patients History and Physical, chart, labs and discussed the procedure including the risks, benefits and alternatives for the proposed anesthesia with the patient or authorized representative who has indicated his/her understanding and acceptance.   Dental advisory given  Plan Discussed with: CRNA  Anesthesia Plan Comments:         Anesthesia Quick Evaluation

## 2013-08-06 NOTE — Op Note (Signed)
Pre-operative diagnosis :   BPH secondary to enlarged ball of the prostate, irrigation of 3 small bladder stones (1 cm aggregate) photodocumentation of bladder wall changes. with lower urinary tract symptoms, bladder stones, trabeculation, cell diverticular formation.  Postoperative diagnosis:  Same   Operation:  Cystourethroscopy, transurethral resection of the prostate, irrigation of 3 bladder stones (1 cm aggregate) placement of Foley catheter (22 hematuria catheter with CBI and traction   Surgeon:  S. Gaynelle Arabian, MD  First assistant:  None   Anesthesia :general LMA   Preparation:  After appropriate preanesthesia, the patient was brought to the operating room, placed on the operating table in the dorsal supine position, where general LMA anesthesia was introduced. He was then replaced in the dorsal lithotomy position with pubis was prepped with Betadine solution and draped in usual fashion. The arm band was double checked. The history was double checked.   Review history:  1. Benign localized hyperplasia of prostate with urinary obstruction  (875.79,728.20)  2. 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 Currently taking Rapaflo/finesteride. Originally referred back by Dr. Joylene Draft for further evaluation of voiding issues. He is having frequency, nocturia, straining to void & a weak stream. He has an IPSS=25/7, despite 2 Flomax hs and 1 Finasteride. PSA 0.795, and repeat 0.622.  He is on flecainide for A. Fib. Possible TIA recently. He remains on Pradaxa.  12/04/12 PSA - 0.622   Statement of  Likelihood of Success: Excellent. TIME-OUT observed.:  Procedure:  Cystourethroscopy was accomplished, and photodocumentation was accomplished of ball valve median lobe. 3 bladder stones were identified within the bladder these were photo documented. Trabeculation, cellule formation, and left  bladder base bladder diverticular formation was also identified.  Resection was begun at the 7:00 position, and carried to the 5:00 position. Care was taken to avoid any injury to the Vero. Extensive cauterization was accomplished for bleeding control. The lateral lobes were minimally resected, and cauterized.  Chips were evacuated from the bladder. Stones were evacuated from the bladder and sent to laboratory with the chips. Prostatic stones were also sent with the specimen.  The patient received IV Tylenol, IV Toradol, and a B. and O. suppository. He also received IV antibiotic. He tolerated the procedure well. A size 22 hematuria catheter was placed to continuous irrigation and traction. The CBI will be discontinued in recovery room. They from the 11 to be discharged as an outpatient. He was awakened, and taken to recovery room in good condition.

## 2013-08-06 NOTE — OR Nursing (Signed)
Betablocker not given due to pt. Having lower heart rate per order of anesthesiologist.

## 2013-08-06 NOTE — Interval H&P Note (Signed)
History and Physical Interval Note:  08/06/2013 8:40 AM  Joel [name]  has presented today for surgery, with the diagnosis of BENIGN PROSTATIC HYPERPLASIA  The various methods of treatment have been discussed with the patient and family. After consideration of risks, benefits and other options for treatment, the patient has consented to  Procedure(s): TRANSURETHRAL RESECTION OF THE PROSTATE (TURP) WITH GYRUS (N/A) as a surgical intervention .  The patient's history has been reviewed, patient examined, no change in status, stable for surgery.  I have reviewed the patient's chart and labs.  Questions were answered to the patient's satisfaction.     Carolan Clines I

## 2013-08-06 NOTE — Discharge Instructions (Signed)
Post transurethral resection of the prostate (TURP) instructions ° °Your recent prostate surgery requires very special post hospital care. Despite the fact that no skin incisions were used the area around the prostate incision is quite raw and is covered with a scab to promote healing and prevent bleeding. Certain cautions are needed to assure that the scab is not disturbed of the next 2-3 weeks while the healing proceeds. ° °Because the raw surface in your prostate and the irritating effects of urine you may expect frequency of urination and/or urgency (a stronger desire to urinate) and perhaps even getting up at night more often. This will usually resolve or improve slowly over the healing period. You may see some blood in your urine over the first 6 weeks. Do not be alarmed, even if the urine was clear for a while. Get off your feet and drink lots of fluids until clearing occurs. If you start to pass clots or don't improve call us. ° °Diet: ° °You may return to your normal diet immediately. Because of the raw surface of your bladder, alcohol, spicy foods, foods high in acid and drinks with caffeine may cause irritation or frequency and should be used in moderation. To keep your urine flowing freely and avoid constipation, drink plenty of fluids during the day (8-10 glasses). Tip: Avoid cranberry juice because it is very acidic. ° °Activity: ° °Your physical activity doesn't need to be restricted. However, if you are very active, you may see some blood in the urine. We suggest that you reduce your activity under the circumstances until the bleeding has stopped. ° °Bowels: ° °It is important to keep your bowels regular during the postoperative period. Straining with bowel movements can cause bleeding. A bowel movement every other day is reasonable. Use a mild laxative if needed, such as milk of magnesia 2-3 tablespoons, or 2 Dulcolax tablets. Call if you continue to have problems. If you had been taking narcotics  for pain, before, during or after your surgery, you may be constipated. Take a laxative if necessary. ° °Medication: ° °You should resume your pre-surgery medications unless told not to. In addition you may be given an antibiotic to prevent or treat infection. Antibiotics are not always necessary. All medication should be taken as prescribed until the bottles are finished unless you are having an unusual reaction to one of the drugs. ° ° ° ° °Problems you should report to us: ° °a. Fever greater than 101°F. °b. Heavy bleeding, or clots (see notes above about blood in urine). °c. Inability to urinate. °d. Drug reactions (hives, rash, nausea, vomiting, diarrhea). °e. Severe burning or pain with urination that is not improving. ° °

## 2013-08-06 NOTE — Transfer of Care (Signed)
Immediate Anesthesia Transfer of Care Note  Patient: Joel [name]  Procedure(s) Performed: Procedure(s) (LRB): TRANSURETHRAL RESECTION OF THE PROSTATE (TURP) WITH GYRUS, irrigation of bladder stone, cystoscope (N/A)  Patient Location: PACU  Anesthesia Type: General  Level of Consciousness: sedated, patient cooperative and responds to stimulation  Airway & Oxygen Therapy: Patient Spontanous Breathing and Patient connected to face mask oxgen  Post-op Assessment: Report given to PACU RN and Post -op Vital signs reviewed and stable  Post vital signs: Reviewed and stable  Complications: No apparent anesthesia complications

## 2013-08-08 ENCOUNTER — Other Ambulatory Visit: Payer: Self-pay | Admitting: Internal Medicine

## 2013-08-10 ENCOUNTER — Encounter (HOSPITAL_COMMUNITY): Payer: Self-pay | Admitting: Urology

## 2013-08-11 ENCOUNTER — Telehealth: Payer: Self-pay | Admitting: Internal Medicine

## 2013-08-11 NOTE — Telephone Encounter (Signed)
°  Patient has surgery and is now having trouble with his medication Prodaxa, please call and advise.

## 2013-08-11 NOTE — Telephone Encounter (Signed)
Spoke with patient and he had a TURP on Fri and he stopped the Pradaxa 48 hours prior and restarted on Sat.  The Urologist has had him hold until Fri and the bleeding is much better

## 2013-08-12 NOTE — Telephone Encounter (Signed)
Dr. Taylor aware. °

## 2013-08-15 ENCOUNTER — Other Ambulatory Visit: Payer: Self-pay | Admitting: Internal Medicine

## 2013-09-07 ENCOUNTER — Other Ambulatory Visit: Payer: Self-pay | Admitting: Internal Medicine

## 2013-09-27 ENCOUNTER — Other Ambulatory Visit: Payer: Self-pay | Admitting: Internal Medicine

## 2013-10-28 ENCOUNTER — Other Ambulatory Visit: Payer: Self-pay | Admitting: Internal Medicine

## 2013-11-05 ENCOUNTER — Other Ambulatory Visit: Payer: Self-pay

## 2013-12-19 ENCOUNTER — Other Ambulatory Visit: Payer: Self-pay | Admitting: Internal Medicine

## 2014-01-20 ENCOUNTER — Other Ambulatory Visit: Payer: Self-pay | Admitting: Internal Medicine

## 2014-02-01 ENCOUNTER — Telehealth: Payer: Self-pay | Admitting: Internal Medicine

## 2014-02-01 NOTE — Telephone Encounter (Signed)
New Message  Pt wanted to speak with Rn about his medication. Please call back and discuss.

## 2014-02-01 NOTE — Telephone Encounter (Signed)
Spoke with patient.  He had to reschedule his appointment from tomorrow due to family funeral.  He has it rescheduled for 02/2014  Just wanted to make sure he was going to be okay of refills.  I let him know we would fill his meds if needed until his appoinment

## 2014-02-02 ENCOUNTER — Ambulatory Visit: Payer: Medicare Other | Admitting: Internal Medicine

## 2014-02-23 ENCOUNTER — Ambulatory Visit (INDEPENDENT_AMBULATORY_CARE_PROVIDER_SITE_OTHER): Payer: Medicare HMO | Admitting: Internal Medicine

## 2014-02-23 ENCOUNTER — Encounter: Payer: Self-pay | Admitting: Internal Medicine

## 2014-02-23 DIAGNOSIS — I495 Sick sinus syndrome: Secondary | ICD-10-CM | POA: Insufficient documentation

## 2014-02-23 DIAGNOSIS — I480 Paroxysmal atrial fibrillation: Secondary | ICD-10-CM

## 2014-02-23 DIAGNOSIS — G459 Transient cerebral ischemic attack, unspecified: Secondary | ICD-10-CM | POA: Insufficient documentation

## 2014-02-23 NOTE — Patient Instructions (Signed)
Your physician wants you to follow-up in: 1 year with Dr. Lovena Le. You will receive a reminder letter in the mail two months in advance. If you don't receive a letter, please call our office to schedule the follow-up appointment.  Your physician recommends that you continue on your current medications as directed. Please refer to the Current Medication list given to you today.

## 2014-02-23 NOTE — Assessment & Plan Note (Signed)
He is maintaining NSR very nicely. He will continue flecainide and metoprolol.

## 2014-02-23 NOTE — Assessment & Plan Note (Signed)
Currently, his history of TIA results in his stroke risk being sufficient to require that he continued taking systemic anticoagulation. He has had no recurrent symptoms. He does note that his father had a stroke and had atrial fibrillation. He will continue systemic anticoagulation for now. He is currently asymptomatic.

## 2014-02-23 NOTE — Progress Notes (Signed)
HPI Mr. Adelson returns today for followup. He is a very pleasant 69 year old man with a history of paroxysmal atrial fibrillation, who is been maintained in sinus rhythm on flecainide. 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 He denies chest pain, shortness of breath, or syncope. He has rare palpitations. The patient has had some fatigue and malaise and overall has had some periods of tiredness. He has a history of TIAs in the past and has been maintained on systemic anticoagulation. He notes slightly increase in size in his left leg (calf) compared to his right. No redness of pain.  Allergies  Allergen Reactions  . Penicillins     REACTION: hives     Current Outpatient Prescriptions  Medication Sig Dispense Refill  . atorvastatin (LIPITOR) 10 MG tablet TAKE 1/2 TABLET BY MOUTH DAILY 30 tablet 6  . ciprofloxacin (CIPRO) 500 MG tablet Take 1 tablet (500 mg total) by mouth 2 (two) times daily. 10 tablet 0  . finasteride (PROSCAR) 5 MG tablet Take 1 tablet (5 mg total) by mouth daily. 1 tablet 0  . flecainide (TAMBOCOR) 100 MG tablet TAKE 1 TABLET BY MOUTH TWICE A DAY 60 tablet 10  . metoprolol succinate (TOPROL-XL) 25 MG 24 hr tablet TAKE 1/2 TABLET BY MOUTH DAILY. 30 tablet 3  . oxyCODONE-acetaminophen (ROXICET) 5-325 MG per tablet Take 1 tablet by mouth every 4 (four) hours as needed for severe pain. 30 tablet 0  . phenazopyridine (PYRIDIUM) 200 MG tablet Take 1 tablet (200 mg total) by mouth 3 (three) times daily as needed for pain. 30 tablet 3  . PRADAXA 150 MG CAPS capsule TAKE ONE CAPSULE BY MOUTH EVERY 12 HOURS 60 capsule 0  . silodosin (RAPAFLO) 8 MG CAPS capsule Take 8 mg by mouth every evening.      No current facility-administered medications for this visit.     Past Medical History  Diagnosis Date  . Hypercholesterolemia   . Atrial fibrillation   . Benign prostatic hypertrophy   . Memory loss   . Family history of anesthesia  complication     "MOTHER DEVELOPED DEMENTIA AFTER SURGERY"  . Arthritis   . GERD (gastroesophageal reflux disease)     INTERMITANT -NO MEDS  . Nocturia   . Anxiety     ROS:   All systems reviewed and negative except as noted in the HPI.   Past Surgical History  Procedure Laterality Date  . Plate neck  1324    TITANIUM PLATE IN NECK  . Appendectomy    . Transurethral resection of prostate N/A 08/06/2013    Procedure: TRANSURETHRAL RESECTION OF THE PROSTATE (TURP) WITH GYRUS, irrigation of bladder stone, cystoscope;  Surgeon: Ailene Rud, MD;  Location: [facility];  Service: Urology;  Laterality: N/A;     Family History  Problem Relation Age of Onset  . Hypertension Mother   . Hyperlipidemia Mother   . Arrhythmia Father   . Stroke Father     33s  . Hypertension Sister      History   Social History  . Marital Status: Married    Spouse Name: N/A    Number of Children: N/A  . Years of Education: N/A   Occupational History  . psychologist    Social History Main Topics  . Smoking status: Former Research scientist (life sciences)  . Smokeless tobacco: Former Systems developer    Quit date: 08/02/1993  . Alcohol Use: Yes     Comment: 2 DRINKS 4-5 TIMES  PER WK  . Drug Use: No  . Sexual Activity: Not on file   Other Topics Concern  . Not on file   Social History Narrative   Son is mentally handicapped;   Mother has moved into the home     There were no vitals taken for this visit.  Physical Exam:  Well appearing 69 year old man, NAD HEENT: Unremarkable Neck:  7cm JVD, no thyromegally Back:  No CVA tenderness Lungs:  Clear with no wheezes, rales, or rhonchi. HEART:  Regular brady rhythm, no murmurs, no rubs, no clicks Abd:  soft, positive bowel sounds, no organomegally, no rebound, no guarding Ext:  2 plus pulses, no edema, no cyanosis, no clubbing Skin:  No rashes no nodules Neuro:  CN II through XII intact, motor grossly intact  EKG - sinus bradycardia   Assess/Plan:

## 2014-02-23 NOTE — Assessment & Plan Note (Signed)
He will continue low-dose Lipitor. He is encouraged to maintain a low fat diet. He exercises vigorously already.

## 2014-02-23 NOTE — Assessment & Plan Note (Signed)
He is currently asymptomatic. His resting heart rate today is 46 bpm. He will undergo watchful waiting. My suspicion is that he will ultimately requiring a permanent pacemaker. Currently there is no indication.

## 2014-02-25 ENCOUNTER — Other Ambulatory Visit: Payer: Self-pay | Admitting: Internal Medicine

## 2014-04-14 ENCOUNTER — Other Ambulatory Visit: Payer: Self-pay | Admitting: Internal Medicine

## 2014-07-18 ENCOUNTER — Other Ambulatory Visit: Payer: Self-pay

## 2014-07-24 ENCOUNTER — Encounter: Payer: Self-pay | Admitting: Internal Medicine

## 2014-07-28 ENCOUNTER — Encounter: Payer: Self-pay | Admitting: Internal Medicine

## 2014-08-09 ENCOUNTER — Encounter: Payer: Self-pay | Admitting: Internal Medicine

## 2014-08-18 ENCOUNTER — Other Ambulatory Visit: Payer: Self-pay | Admitting: Internal Medicine

## 2014-09-12 ENCOUNTER — Encounter: Payer: Self-pay | Admitting: Internal Medicine

## 2014-09-27 ENCOUNTER — Other Ambulatory Visit: Payer: Self-pay | Admitting: Internal Medicine

## 2014-11-05 DIAGNOSIS — Z23 Encounter for immunization: Secondary | ICD-10-CM | POA: Diagnosis not present

## 2014-11-09 ENCOUNTER — Telehealth: Payer: Self-pay | Admitting: Internal Medicine

## 2014-11-09 NOTE — Telephone Encounter (Signed)
NewMessage  Pt c/o of slight AF at the same time everyday- pt sched appt w/ Dr Hall Busing on 11/29 but requested to speak more about it w/ RN.Please call back and discuss.

## 2014-11-09 NOTE — Telephone Encounter (Signed)
Has had 2 episodes of afib in the last week.  Both lasting an hour and occur after dinner and are associated with lots of gas.   He feels ok otherwise.  The last time this happened he took Prilosec and it helped a lot.  He is going to try this again and let me know if his symptoms do not subside.

## 2014-11-21 ENCOUNTER — Telehealth: Payer: Self-pay | Admitting: Internal Medicine

## 2014-11-21 DIAGNOSIS — N138 Other obstructive and reflux uropathy: Secondary | ICD-10-CM | POA: Diagnosis not present

## 2014-11-21 DIAGNOSIS — E291 Testicular hypofunction: Secondary | ICD-10-CM | POA: Diagnosis not present

## 2014-11-21 DIAGNOSIS — R351 Nocturia: Secondary | ICD-10-CM | POA: Diagnosis not present

## 2014-11-21 NOTE — Telephone Encounter (Signed)
Spoke with patient and let him know that as he is not  staying in afib for long and he feels okay during episodes we will not make any changes.  Dr Lovena Le suggest he keep his appointment 11/29 and if he feels he is s going too fast he can take an extra dose of Metoprolol.  He says he never goes fast and he does not feel bad it just scares him.

## 2014-11-21 NOTE — Telephone Encounter (Signed)
New message    patient calling follow up on early called - Afib.

## 2014-11-22 ENCOUNTER — Telehealth: Payer: Self-pay

## 2014-11-22 ENCOUNTER — Encounter: Payer: Self-pay | Admitting: Internal Medicine

## 2014-11-22 ENCOUNTER — Ambulatory Visit (INDEPENDENT_AMBULATORY_CARE_PROVIDER_SITE_OTHER): Payer: Medicare HMO | Admitting: Internal Medicine

## 2014-11-22 VITALS — BP 108/70 | HR 60 | Ht 76.0 in | Wt 189.0 lb

## 2014-11-22 DIAGNOSIS — Z7901 Long term (current) use of anticoagulants: Secondary | ICD-10-CM

## 2014-11-22 DIAGNOSIS — Z86010 Personal history of colonic polyps: Secondary | ICD-10-CM | POA: Diagnosis not present

## 2014-11-22 DIAGNOSIS — I480 Paroxysmal atrial fibrillation: Secondary | ICD-10-CM | POA: Diagnosis not present

## 2014-11-22 NOTE — Telephone Encounter (Signed)
[facility] [address]  [place] [address]  11/22/2014   RE: Galvin Aversa Proehl DOB: [birth year] MRN: [id number]   Dear Cristopher Peru MD,    We have scheduled the above patient for an endoscopic procedure. Our records show that he is on anticoagulation therapy.   Please advise as to how long the patient may come off his therapy of Pradaxa prior to the colonoscopy procedure, which is scheduled for 01/26/2015.  Please fax back/ or route the completed form to Magie Ciampa [place], [place] at [phone].   Sincerely,    Silvano Rusk, MD, [facility]

## 2014-11-22 NOTE — Patient Instructions (Signed)
You have been scheduled for a colonoscopy. Please follow written instructions given to you at your visit today.  Please pick up your prep supplies at the pharmacy within the next 1-3 days. If you use inhalers (even only as needed), please bring them with you on the day of your procedure.   You will be contaced by our office prior to your procedure for directions on holding your Pradaxa.  If you do not hear from our office 1 week prior to your scheduled procedure, please call [phone] to discuss.    I appreciate the opportunity to care for you. Silvano Rusk, MD, [facility]

## 2014-11-22 NOTE — Progress Notes (Signed)
Subjective:    Patient ID: Joel [name], male    DOB: Jul 29, 1945, 69 y.o.   MRN: [id number] Cc: hx colon polyps HPI Here for f/u colonoscopy evaluation - has hx 8 mm adenoma 2011 Afib  Acting up having some episodes of palpitations   rare spot of blood with defecation when constipated  He is going to see cardiology later in Nov and discuss Afib ablation    Allergies  Allergen Reactions  . Penicillins     REACTION: hives   Outpatient Prescriptions Prior to Visit  Medication Sig Dispense Refill  . atorvastatin (LIPITOR) 10 MG tablet TAKE 1/2 TABLET BY MOUTH DAILY 15 tablet 11  . finasteride (PROSCAR) 5 MG tablet Take 1 tablet (5 mg total) by mouth daily. 1 tablet 0  . flecainide (TAMBOCOR) 100 MG tablet TAKE 1 TABLET BY MOUTH TWICE A DAY 60 tablet 3  . metoprolol succinate (TOPROL-XL) 25 MG 24 hr tablet TAKE 1/2 TABLET BY MOUTH DAILY. 30 tablet 11  . PRADAXA 150 MG CAPS capsule TAKE ONE CAPSULE BY MOUTH EVERY 12 HOURS 60 capsule 11   No facility-administered medications prior to visit.   Past Medical History  Diagnosis Date  . Hypercholesterolemia   . Atrial fibrillation ([place])   . Benign prostatic hypertrophy   . Memory loss   . Family history of anesthesia complication     "MOTHER DEVELOPED DEMENTIA AFTER SURGERY"  . Arthritis   . GERD (gastroesophageal reflux disease)     INTERMITANT -NO MEDS  . Nocturia   . Anxiety   . Hx of adenomatous polyp of colon 05/23/2009   Past Surgical History  Procedure Laterality Date  . Plate neck  7035    TITANIUM PLATE IN NECK  . Appendectomy    . Transurethral resection of prostate N/A 08/06/2013    Procedure: TRANSURETHRAL RESECTION OF THE PROSTATE (TURP) WITH GYRUS, irrigation of bladder stone, cystoscope;  Surgeon: Ailene Rud, MD;  Location: [facility];  Service: Urology;  Laterality: N/A;  . Colonoscopy w/ polypectomy     Social History   Social History  . Marital Status: Married    Spouse Name: N/A  . Number of  Children: N/A  . Years of Education: N/A   Occupational History  . psychologist    Social History Main Topics  . Smoking status: Former Research scientist (life sciences)  . Smokeless tobacco: Former Systems developer    Quit date: 08/02/1993  . Alcohol Use: 0.0 oz/week    0 Standard drinks or equivalent per week     Comment: 2 DRINKS 4-5 TIMES PER WK  . Drug Use: No  . Sexual Activity: Not Asked   Other Topics Concern  . None   Social History Narrative   Son is mentally handicapped;   Mother has moved into the home   Family History  Problem Relation Age of Onset  . Hypertension Mother   . Hyperlipidemia Mother   . Arrhythmia Father   . Stroke Father     71s  . Hypertension Sister         Review of Systems As above Exercised - walking/running and is concerned about Afib problems related to that    Objective:   Physical Exam @BP  108/70 mmHg  Pulse 60  Ht 6\' 4"  (1.93 m)  Wt 189 lb (85.73 kg)  BMI 23.02 kg/m2@  General:  NAD Eyes:   anicteric Lungs:  clear Heart:: S1S2 reg rhythm no rubs, murmurs or gallops Abdomen:  soft and nontender, BS+  Ext:   no edema, cyanosis or clubbing    Data Reviewed:  Prior colonoscopy Cardiology notes     Assessment & Plan:  Hx of adenomatous colonic polyps - Plan: Ambulatory referral to Gastroenterology  Paroxysmal atrial fibrillation ([place])  Chronic anticoagulation - Pradaxa  Will go ahead and schedule colonoscopy - anticipate holding Pradaxa 2 d before and have instructed him. Will also check with cardiology about this to see if they agree.  The risks and benefits as well as alternatives of endoscopic procedure(s) have been discussed and reviewed. All questions answered. The patient agrees to proceed. Increased but rare risk of stroke off Prdaxa in setting of Afib also reviewed.

## 2014-11-23 NOTE — Telephone Encounter (Signed)
Ok to stop anti-coagulation prior to the procedure. GT

## 2014-11-27 ENCOUNTER — Encounter: Payer: Self-pay | Admitting: Internal Medicine

## 2014-12-19 ENCOUNTER — Telehealth: Payer: Self-pay

## 2014-12-19 NOTE — Telephone Encounter (Signed)
-----   Message from Evans Lance, MD sent at 12/18/2014  3:46 PM EST ----- 2 days. Start back when ok with GI MD. It is active within 4 hours of restart. GT ----- Message -----    From: Jamiel Goncalves E [place], [place]    Sent: 11/24/2014   3:31 PM      To: Evans Lance, MD  Thank you for getting back to me quickly with an answer regarding holding his pradaxa for his colonoscopy to be done 01/26/15.  Just to clarify , how long would you prefer he hold it?  Thank you for your time.

## 2014-12-19 NOTE — Telephone Encounter (Signed)
Patient informed and verbalized understanding.  He said he is seeing Dr Lovena Le tomorrow and if anything new is discussed about Pradaxa he will let us know.

## 2014-12-20 ENCOUNTER — Ambulatory Visit (INDEPENDENT_AMBULATORY_CARE_PROVIDER_SITE_OTHER): Payer: Medicare HMO | Admitting: Internal Medicine

## 2014-12-20 ENCOUNTER — Encounter: Payer: Self-pay | Admitting: Internal Medicine

## 2014-12-20 VITALS — BP 110/70 | HR 56 | Ht 76.0 in | Wt 191.0 lb

## 2014-12-20 DIAGNOSIS — I495 Sick sinus syndrome: Secondary | ICD-10-CM | POA: Diagnosis not present

## 2014-12-20 DIAGNOSIS — G459 Transient cerebral ischemic attack, unspecified: Secondary | ICD-10-CM

## 2014-12-20 DIAGNOSIS — I480 Paroxysmal atrial fibrillation: Secondary | ICD-10-CM | POA: Diagnosis not present

## 2014-12-20 MED ORDER — FLECAINIDE ACETATE 100 MG PO TABS: ORAL_TABLET | ORAL | Status: DC

## 2014-12-20 NOTE — Patient Instructions (Signed)
Medication Instructions:   Your physician has recommended you make the following change in your medication:  1) Take a extra 50 mg of Flecainide for afib as needed    Labwork: None ordered   Testing/Procedures: None ordered   Follow-Up: Your physician wants you to follow-up in: 6 months with Dr Knox Saliva will receive a reminder letter in the mail two months in advance. If you don't receive a letter, please call our office to schedule the follow-up appointment.   Any Other Special Instructions Will Be Listed Below (If Applicable).     If you need a refill on your cardiac medications before your next appointment, please call your pharmacy.

## 2014-12-20 NOTE — Assessment & Plan Note (Signed)
He has had worsening symptoms. Today I asked him to take an additional 50 mg daily of flecainide as needed. We also discussed additional medications and catheter ablation. For now, I do not think he has enough atrial fib to warrant a medication change or ablation.

## 2014-12-20 NOTE — Progress Notes (Signed)
HPI Joel [name] returns today for followup. He is a very pleasant 69 year old man with a history of paroxysmal atrial fibrillation, who is been maintained in sinus rhythm on flecainide. 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 This has been stressful to him. He denies chest pain, shortness of breath, or syncope. He has had increasingly frequent palpitations over the past week. The patient has had some fatigue and malaise and overall has had some periods of tiredness.  Allergies  Allergen Reactions  . Penicillins     REACTION: hives     Current Outpatient Prescriptions  Medication Sig Dispense Refill  . atorvastatin (LIPITOR) 10 MG tablet TAKE 1/2 TABLET BY MOUTH DAILY 15 tablet 11  . finasteride (PROSCAR) 5 MG tablet Take 1 tablet (5 mg total) by mouth daily. 1 tablet 0  . flecainide (TAMBOCOR) 100 MG tablet Take one tablet by mouth twice daily and okay to take 1/2 tablet daily as needed for afib 225 tablet 3  . metoprolol succinate (TOPROL-XL) 25 MG 24 hr tablet TAKE 1/2 TABLET BY MOUTH DAILY. 30 tablet 11  . PRADAXA 150 MG CAPS capsule TAKE ONE CAPSULE BY MOUTH EVERY 12 HOURS 60 capsule 11  . vitamin B-12 (CYANOCOBALAMIN) 500 MCG tablet Take 500 mcg by mouth daily.     No current facility-administered medications for this visit.     Past Medical History  Diagnosis Date  . Hypercholesterolemia   . Atrial fibrillation ([place])   . Benign prostatic hypertrophy   . Memory loss   . Family history of anesthesia complication     "MOTHER DEVELOPED DEMENTIA AFTER SURGERY"  . Arthritis   . GERD (gastroesophageal reflux disease)     INTERMITANT -NO MEDS  . Nocturia   . Anxiety   . Hx of adenomatous polyp of colon 05/23/2009    ROS:   All systems reviewed and negative except as noted in the HPI.   Past Surgical History  Procedure Laterality Date  . Plate neck  [id number]    TITANIUM PLATE IN NECK  . Appendectomy    .  Transurethral resection of prostate N/A 08/06/2013    Procedure: TRANSURETHRAL RESECTION OF THE PROSTATE (TURP) WITH GYRUS, irrigation of bladder stone, cystoscope;  Surgeon: Ailene Rud, MD;  Location: [facility];  Service: Urology;  Laterality: N/A;  . Colonoscopy w/ polypectomy       Family History  Problem Relation Age of Onset  . Hypertension Mother   . Hyperlipidemia Mother   . Arrhythmia Father   . Stroke Father     75s  . Hypertension Sister      Social History   Social History  . Marital Status: Married    Spouse Name: N/A  . Number of Children: N/A  . Years of Education: N/A   Occupational History  . psychologist    Social History Main Topics  . Smoking status: Former Research scientist (life sciences)  . Smokeless tobacco: Former Systems developer    Quit date: 08/02/1993  . Alcohol Use: 0.0 oz/week    0 Standard drinks or equivalent per week     Comment: 2 DRINKS 4-5 TIMES PER WK  . Drug Use: No  . Sexual Activity: Not on file   Other Topics Concern  . Not on file   Social History Narrative   Son is mentally handicapped;   Mother has moved into the home     BP 110/70 mmHg  Pulse 56  Ht 6\' 4"  (  1.93 m)  Wt 191 lb (86.637 kg)  BMI 23.26 kg/m2  Physical Exam:  Well appearing 69 year old man, NAD HEENT: Unremarkable Neck:  7cm JVD, no thyromegally Back:  No CVA tenderness Lungs:  Clear with no wheezes, rales, or rhonchi. HEART:  Regular brady rhythm, no murmurs, no rubs, no clicks Abd:  soft, positive bowel sounds, no organomegally, no rebound, no guarding Ext:  2 plus pulses, no edema, no cyanosis, no clubbing Skin:  No rashes no nodules Neuro:  CN II through XII intact, motor grossly intact  EKG - sinus bradycardia   Assess/Plan:

## 2014-12-20 NOTE — Assessment & Plan Note (Signed)
He has had no recurrent symptoms. He will continue his systemic anti-coagulation with Pradaxa.

## 2014-12-20 NOTE — Assessment & Plan Note (Signed)
He has had no symptomatic episodes. Will try and avoid sinus nodal slowing medications for now.

## 2015-01-26 ENCOUNTER — Encounter: Payer: Medicare HMO | Admitting: Internal Medicine

## 2015-01-26 ENCOUNTER — Ambulatory Visit (AMBULATORY_SURGERY_CENTER): Payer: Medicare HMO | Admitting: Internal Medicine

## 2015-01-26 ENCOUNTER — Encounter: Payer: Self-pay | Admitting: Internal Medicine

## 2015-01-26 ENCOUNTER — Telehealth: Payer: Self-pay | Admitting: *Deleted

## 2015-01-26 VITALS — BP 123/65 | HR 56 | Temp 97.7°F | Resp 16 | Ht 76.0 in | Wt 189.0 lb

## 2015-01-26 DIAGNOSIS — I4891 Unspecified atrial fibrillation: Secondary | ICD-10-CM | POA: Diagnosis not present

## 2015-01-26 DIAGNOSIS — D123 Benign neoplasm of transverse colon: Secondary | ICD-10-CM

## 2015-01-26 DIAGNOSIS — D124 Benign neoplasm of descending colon: Secondary | ICD-10-CM

## 2015-01-26 DIAGNOSIS — Z86010 Personal history of colonic polyps: Secondary | ICD-10-CM | POA: Diagnosis not present

## 2015-01-26 DIAGNOSIS — Z1211 Encounter for screening for malignant neoplasm of colon: Secondary | ICD-10-CM | POA: Diagnosis not present

## 2015-01-26 DIAGNOSIS — Z800 Family history of malignant neoplasm of digestive organs: Secondary | ICD-10-CM | POA: Insufficient documentation

## 2015-01-26 MED ORDER — SODIUM CHLORIDE 0.9 % IV SOLN: 500.0000 mL | INTRAVENOUS | Status: DC

## 2015-01-26 NOTE — Patient Instructions (Addendum)
I found and removed 2 small polyps - I will let you know pathology results and when to have another routine colonoscopy by mail.  I appreciate the opportunity to care for you. Gatha Mayer, MD, FACG  RESUME YOUR PRADAXA TODAY.  YOU HAD AN ENDOSCOPIC PROCEDURE TODAY AT [facility]:   Refer to the procedure report that was given to you for any specific questions about what was found during the examination.  If the procedure report does not answer your questions, please call your gastroenterologist to clarify.  If you requested that your care partner not be given the details of your procedure findings, then the procedure report has been included in a sealed envelope for you to review at your convenience later.  YOU SHOULD EXPECT: Some feelings of bloating in the abdomen. Passage of more gas than usual.  Walking can help get rid of the air that was put into your GI tract during the procedure and reduce the bloating. If you had a lower endoscopy (such as a colonoscopy or flexible sigmoidoscopy) you may notice spotting of blood in your stool or on the toilet paper. If you underwent a bowel prep for your procedure, you may not have a normal bowel movement for a few days.  Please Note:  You might notice some irritation and congestion in your nose or some drainage.  This is from the oxygen used during your procedure.  There is no need for concern and it should clear up in a day or so.  SYMPTOMS TO REPORT IMMEDIATELY:   Following lower endoscopy (colonoscopy or flexible sigmoidoscopy):  Excessive amounts of blood in the stool  Significant tenderness or worsening of abdominal pains  Swelling of the abdomen that is new, acute  Fever of 100F or higher   For urgent or emergent issues, a gastroenterologist can be reached at any hour by calling [phone].   DIET: Your first meal following the procedure should be a small meal and then it is ok to progress to your normal  diet. Heavy or fried foods are harder to digest and may make you feel nauseous or bloated.  Likewise, meals heavy in dairy and vegetables can increase bloating.  Drink plenty of fluids but you should avoid alcoholic beverages for 24 hours.  ACTIVITY:  You should plan to take it easy for the rest of today and you should NOT DRIVE or use heavy machinery until tomorrow (because of the sedation medicines used during the test).    FOLLOW UP: Our staff will call the number listed on your records the next business day following your procedure to check on you and address any questions or concerns that you may have regarding the information given to you following your procedure. If we do not reach you, we will leave a message.  However, if you are feeling well and you are not experiencing any problems, there is no need to return our call.  We will assume that you have returned to your regular daily activities without incident.  If any biopsies were taken you will be contacted by phone or by letter within the next 1-3 weeks.  Please call us at [phone] if you have not heard about the biopsies in 3 weeks.    SIGNATURES/CONFIDENTIALITY: You and/or your care partner have signed paperwork which will be entered into your electronic medical record.  These signatures attest to the fact that that the information above on your After Visit Summary has been  reviewed and is understood.  Full responsibility of the confidentiality of this discharge information lies with you and/or your care-partner. 

## 2015-01-26 NOTE — Progress Notes (Signed)
A/ox3 pleased with MAC, report to Karen RN 

## 2015-01-26 NOTE — Telephone Encounter (Signed)
PATIENT CALLED EMERGENCY CONTACT NUMBER. PATIENT STATING HE JUST PASSED TWO 'TISSUE LIKE THINGS ABOUT THE SIZE OF A DIME COMBINED.' PATIENT STATING NO BLOOD, PAIN OR SWELLING. DISCUSSED WITH DR. Carlean Purl, CALLED PATIENT BACK.PER DR. Carlean Purl NO CONCERN PROBABLY MUCOUS. AS LONG AS NO BLEEDING. PATIENT VERBALIZED UNDERSTANDING. PATIENT WILL CALL IF BLEEDING ORCCURS, ABDOMINAL PAIN OR SWELLING.

## 2015-01-26 NOTE — Op Note (Addendum)
[facility]  [address] [place], [address]   COLONOSCOPY PROCEDURE REPORT  PATIENT: [name], Joel  MR#: [id number] BIRTHDATE: 1945/06/13 , 19  yrs. old GENDER: male ENDOSCOPIST: Gatha Mayer, MD, [facility] PROCEDURE DATE:  01/26/2015 PROCEDURE:   Colonoscopy, surveillance and Colonoscopy with snare polypectomy First Screening Colonoscopy - Avg.  risk and is 50 yrs.  old or older - No.  Prior Negative Screening - Now for repeat screening. N/A  History of Adenoma - Now for follow-up colonoscopy & has been > or = to 3 yrs.  Yes hx of adenoma.  Has been 3 or more years since last colonoscopy.  Polyps removed today? Yes ASA CLASS:   Class II INDICATIONS:Surveillance due to prior colonic neoplasia, PH Colon Adenoma, and FH Colon or Rectal Adenocarcinoma. MEDICATIONS: Propofol 250 mg IV and Monitored anesthesia care  DESCRIPTION OF PROCEDURE:   After the risks benefits and alternatives of the procedure were thoroughly explained, informed consent was obtained.  The digital rectal exam revealed no abnormalities of the rectum and revealed no prostatic nodules. The LB [id number]  endoscope was introduced through the anus and advanced to the cecum, which was identified by both the appendix and ileocecal valve. No adverse events experienced.   The quality of the prep was excellent.  (MiraLax was used)  The instrument was then slowly withdrawn as the colon was fully examined. Estimated blood loss is zero unless otherwise noted in this procedure report.      COLON FINDINGS: Two sessile polyps ranging from 5 to 23mm in size were found in the descending colon and transverse colon. Polypectomies were performed with a cold snare.  The resection was complete, the polyp tissue was completely retrieved and sent to histology.   The examination was otherwise normal.  Retroflexed views revealed no abnormalities. The time to cecum = 5.0 Withdrawal time = 8.0   The scope  was withdrawn and the procedure completed. COMPLICATIONS: There were no immediate complications.  ENDOSCOPIC IMPRESSION: 1.   Two sessile polyps ranging from 5 to 63mm in size were found in the descending colon and transverse colon; polypectomies were performed with a cold snare 2.   The examination was otherwise normal - excellent prep  RECOMMENDATIONS: Timing of repeat colonoscopy will be determined by pathology findings. Resume Pradaxa today  eSigned:  Gatha Mayer, MD, [facility] 01/26/2015 2:39 PM Revised: 01/26/2015 2:39 PM cc: The Patient and Dr. Joylene Draft

## 2015-01-26 NOTE — Progress Notes (Signed)
Called to room to assist during endoscopic procedure.  Patient ID and intended procedure confirmed with present staff. Received instructions for my participation in the procedure from the performing physician.  

## 2015-01-27 ENCOUNTER — Telehealth: Payer: Self-pay

## 2015-01-27 NOTE — Telephone Encounter (Signed)
  Follow up Call-  Call back number 01/26/2015  Post procedure Call Back phone  # [phone]  Permission to leave phone message Yes     Patient questions:  Do you have a fever, pain , or abdominal swelling? No. Pain Score  0 *  Have you tolerated food without any problems? Yes.    Have you been able to return to your normal activities? Yes.    Do you have any questions about your discharge instructions: Diet   No. Medications  No. Follow up visit  No.  Do you have questions or concerns about your Care? No.  Actions: * If pain score is 4 or above: No action needed, pain <4.

## 2015-02-01 ENCOUNTER — Encounter: Payer: Self-pay | Admitting: Internal Medicine

## 2015-02-01 DIAGNOSIS — Z86010 Personal history of colonic polyps: Secondary | ICD-10-CM

## 2015-02-01 NOTE — Progress Notes (Signed)
Quick Note:  2 small adenomas Recall colonoscopy 2022 ______

## 2015-02-08 DIAGNOSIS — E785 Hyperlipidemia, unspecified: Secondary | ICD-10-CM | POA: Diagnosis not present

## 2015-02-08 DIAGNOSIS — D7589 Other specified diseases of blood and blood-forming organs: Secondary | ICD-10-CM | POA: Diagnosis not present

## 2015-02-08 DIAGNOSIS — Z0000 Encounter for general adult medical examination without abnormal findings: Secondary | ICD-10-CM | POA: Diagnosis not present

## 2015-02-15 DIAGNOSIS — J45998 Other asthma: Secondary | ICD-10-CM | POA: Diagnosis not present

## 2015-02-15 DIAGNOSIS — E784 Other hyperlipidemia: Secondary | ICD-10-CM | POA: Diagnosis not present

## 2015-02-15 DIAGNOSIS — J3089 Other allergic rhinitis: Secondary | ICD-10-CM | POA: Diagnosis not present

## 2015-02-15 DIAGNOSIS — M62830 Muscle spasm of back: Secondary | ICD-10-CM | POA: Diagnosis not present

## 2015-02-15 DIAGNOSIS — E291 Testicular hypofunction: Secondary | ICD-10-CM | POA: Diagnosis not present

## 2015-02-15 DIAGNOSIS — N401 Enlarged prostate with lower urinary tract symptoms: Secondary | ICD-10-CM | POA: Diagnosis not present

## 2015-02-15 DIAGNOSIS — Z0000 Encounter for general adult medical examination without abnormal findings: Secondary | ICD-10-CM | POA: Diagnosis not present

## 2015-02-15 DIAGNOSIS — I480 Paroxysmal atrial fibrillation: Secondary | ICD-10-CM | POA: Diagnosis not present

## 2015-02-15 DIAGNOSIS — R0683 Snoring: Secondary | ICD-10-CM | POA: Diagnosis not present

## 2015-02-19 ENCOUNTER — Other Ambulatory Visit: Payer: Self-pay | Admitting: Internal Medicine

## 2015-02-21 DIAGNOSIS — H43811 Vitreous degeneration, right eye: Secondary | ICD-10-CM | POA: Diagnosis not present

## 2015-03-01 DIAGNOSIS — R69 Illness, unspecified: Secondary | ICD-10-CM | POA: Diagnosis not present

## 2015-03-06 ENCOUNTER — Telehealth: Payer: Self-pay | Admitting: Internal Medicine

## 2015-03-06 NOTE — Telephone Encounter (Signed)
Medication is entered.

## 2015-03-06 NOTE — Telephone Encounter (Signed)
New message    Pt C/O medication issue:  1. Name of Medication: over the counter - Flonase one spray in each nostril  /  Coq 10 & glucosamine- supplement.    2. How are you currently taking this medication (dosage and times per day)? Spray   3. Are you having a reaction (difficulty breathing--STAT)? No   4. What is your medication issue? Wants to let the nurse know he was taking this medication

## 2015-03-07 ENCOUNTER — Telehealth: Payer: Self-pay | Admitting: Internal Medicine

## 2015-03-07 MED ORDER — ATORVASTATIN CALCIUM 10 MG PO TABS: 10.0000 mg | ORAL_TABLET | Freq: Every day | ORAL | Status: AC

## 2015-03-07 NOTE — Telephone Encounter (Signed)
Pt still waiting to know if it is all right for him to take Flonase.Please call if possible and let him know.

## 2015-03-07 NOTE — Telephone Encounter (Signed)
Okay to use Zyrtec nasal spray.  He is aware

## 2015-03-20 ENCOUNTER — Encounter: Payer: Medicare HMO | Admitting: Internal Medicine

## 2015-03-28 DIAGNOSIS — J45998 Other asthma: Secondary | ICD-10-CM | POA: Diagnosis not present

## 2015-03-28 DIAGNOSIS — R05 Cough: Secondary | ICD-10-CM | POA: Diagnosis not present

## 2015-03-28 DIAGNOSIS — J309 Allergic rhinitis, unspecified: Secondary | ICD-10-CM | POA: Diagnosis not present

## 2015-03-28 DIAGNOSIS — Z6825 Body mass index (BMI) 25.0-25.9, adult: Secondary | ICD-10-CM | POA: Diagnosis not present

## 2015-04-19 ENCOUNTER — Other Ambulatory Visit: Payer: Self-pay | Admitting: Internal Medicine

## 2015-05-28 ENCOUNTER — Other Ambulatory Visit: Payer: Self-pay | Admitting: Internal Medicine

## 2015-06-28 ENCOUNTER — Encounter: Payer: Self-pay | Admitting: Internal Medicine

## 2015-07-19 ENCOUNTER — Ambulatory Visit: Payer: Medicare HMO | Admitting: Internal Medicine

## 2015-07-21 DIAGNOSIS — R05 Cough: Secondary | ICD-10-CM | POA: Diagnosis not present

## 2015-07-21 DIAGNOSIS — J301 Allergic rhinitis due to pollen: Secondary | ICD-10-CM | POA: Diagnosis not present

## 2015-07-21 DIAGNOSIS — Z6825 Body mass index (BMI) 25.0-25.9, adult: Secondary | ICD-10-CM | POA: Diagnosis not present

## 2015-08-16 ENCOUNTER — Other Ambulatory Visit: Payer: Self-pay | Admitting: Internal Medicine

## 2015-09-21 ENCOUNTER — Encounter: Payer: Self-pay | Admitting: Internal Medicine

## 2015-09-21 ENCOUNTER — Ambulatory Visit (INDEPENDENT_AMBULATORY_CARE_PROVIDER_SITE_OTHER): Payer: Medicare HMO | Admitting: Internal Medicine

## 2015-09-21 VITALS — BP 110/78 | HR 53 | Ht 76.0 in | Wt 188.2 lb

## 2015-09-21 DIAGNOSIS — I480 Paroxysmal atrial fibrillation: Secondary | ICD-10-CM

## 2015-09-21 NOTE — Patient Instructions (Signed)
Medication Instructions:  Your physician recommends that you continue on your current medications as directed. Please refer to the Current Medication list given to you today.  Eliquis 5 mg twice daily or Xarelto 20 mg daily--are either cheaper than current Pradaxa  Labwork: None ordered   Testing/Procedures: None ordered   Follow-Up: Your physician wants you to follow-up in: 12 months with Dr Knox Saliva will receive a reminder letter in the mail two months in advance. If you don't receive a letter, please call our office to schedule the follow-up appointment.   Any Other Special Instructions Will Be Listed Below (If Applicable).     If you need a refill on your cardiac medications before your next appointment, please call your pharmacy.

## 2015-09-21 NOTE — Progress Notes (Signed)
HPI Joel [name] returns today for followup. He is a very pleasant 70 year old man with a history of paroxysmal atrial fibrillation, who is been maintained in sinus rhythm on flecainide. He remains active, dancing competitively. Since I saw him last 6 months ago, he has changed to a mediterranean diet. He feels much better and he has had only a couple of episodes of afib breakthrough. He takes an extra 50 mg of flecainide when this occurs. No other complaints except for the cost of Pradaxa. Allergies  Allergen Reactions  . Penicillins     REACTION: hives     Current Outpatient Prescriptions  Medication Sig Dispense Refill  . atorvastatin (LIPITOR) 10 MG tablet Take 1 tablet (10 mg total) by mouth daily. 90 tablet 3  . cetirizine (ZYRTEC) 10 MG tablet Take 10 mg by mouth daily.    . Coenzyme Q10 (CO Q 10 PO) Take 1 capsule by mouth daily.    . flecainide (TAMBOCOR) 100 MG tablet Take one tablet by mouth twice daily and okay to take 1/2 tablet daily as needed for afib 225 tablet 3  . fluticasone (FLONASE) 50 MCG/ACT nasal spray Place 1 spray into both nostrils daily as needed for allergies or rhinitis.     . Glucosamine HCl (GLUCOSAMINE PO) Take 1 tablet by mouth daily.    . metoprolol succinate (TOPROL-XL) 25 MG 24 hr tablet TAKE 1/2 TABLET BY MOUTH DAILY. 30 tablet 3  . Multiple Vitamin (MULTIVITAMIN) capsule Take 1 capsule by mouth daily.    Marland Kitchen PRADAXA 150 MG CAPS capsule TAKE ONE CAPSULE BY MOUTH EVERY 12 HOURS 60 capsule 3  . vitamin B-12 (CYANOCOBALAMIN) 500 MCG tablet Take 500 mcg by mouth daily.     No current facility-administered medications for this visit.      Past Medical History:  Diagnosis Date  . Anxiety   . Arthritis   . Atrial fibrillation ([place])   . Benign prostatic hypertrophy   . Family history of anesthesia complication    "MOTHER DEVELOPED DEMENTIA AFTER SURGERY"  . GERD (gastroesophageal reflux disease)    INTERMITANT -NO MEDS  . Hx of adenomatous polyp of  colon 05/23/2009  . Hypercholesterolemia   . Memory loss   . Nocturia     ROS:   All systems reviewed and negative except as noted in the HPI.   Past Surgical History:  Procedure Laterality Date  . APPENDECTOMY    . COLONOSCOPY W/ POLYPECTOMY    . PLATE NECK  [id number]   TITANIUM PLATE IN NECK  . TRANSURETHRAL RESECTION OF PROSTATE N/A 08/06/2013   Procedure: TRANSURETHRAL RESECTION OF THE PROSTATE (TURP) WITH GYRUS, irrigation of bladder stone, cystoscope;  Surgeon: Ailene Rud, MD;  Location: [facility];  Service: Urology;  Laterality: N/A;     Family History  Problem Relation Age of Onset  . Hypertension Mother   . Hyperlipidemia Mother   . Arrhythmia Father   . Stroke Father     12s  . Hypertension Sister      Social History   Social History  . Marital status: Married    Spouse name: N/A  . Number of children: N/A  . Years of education: N/A   Occupational History  . psychologist    Social History Main Topics  . Smoking status: Former Smoker    Packs/day: 1.00    Types: Cigarettes    Quit date: 01/21/1990  . Smokeless tobacco: Former Systems developer    Quit date: 08/02/1993  . Alcohol use  4.8 oz/week    8 Glasses of wine per week     Comment: 2 DRINKS 4-5 TIMES PER WK  . Drug use: No  . Sexual activity: Not on file   Other Topics Concern  . Not on file   Social History Narrative   Son is mentally handicapped;   Mother has moved into the home     BP 110/78   Pulse (!) 53   Ht 6\' 4"  (1.93 m)   Wt 188 lb 3.2 oz (85.4 kg)   BMI 22.91 kg/m   Physical Exam:  Well appearing 70 year old man, NAD HEENT: Unremarkable Neck:  7cm JVD, no thyromegally Back:  No CVA tenderness Lungs:  Clear with no wheezes, rales, or rhonchi. HEART:  Regular brady rhythm, no murmurs, no rubs, no clicks Abd:  soft, positive bowel sounds, no organomegally, no rebound, no guarding Ext:  2 plus pulses, no edema, no cyanosis, no clubbing Skin:  No rashes no nodules Neuro:  CN II  through XII intact, motor grossly intact  EKG - sinus bradycardia   Assess/Plan: 1. PAF - he is maintaining NSR very nicely. He will continue his current meds. 2. Coags - I have asked him to check with his pharmacy on the cost of Eliquis or Xarelto. He will call us if he wises to change. 3. Sinus node dysfunction - he is currently asymptomatic. Will follow.  Mikle Bosworth.D.

## 2015-10-16 DIAGNOSIS — R69 Illness, unspecified: Secondary | ICD-10-CM | POA: Diagnosis not present

## 2015-10-24 DIAGNOSIS — Z23 Encounter for immunization: Secondary | ICD-10-CM | POA: Diagnosis not present

## 2015-11-07 DIAGNOSIS — R69 Illness, unspecified: Secondary | ICD-10-CM | POA: Diagnosis not present

## 2015-11-09 DIAGNOSIS — L0889 Other specified local infections of the skin and subcutaneous tissue: Secondary | ICD-10-CM | POA: Diagnosis not present

## 2015-11-09 DIAGNOSIS — Z6824 Body mass index (BMI) 24.0-24.9, adult: Secondary | ICD-10-CM | POA: Diagnosis not present

## 2015-11-09 DIAGNOSIS — R2232 Localized swelling, mass and lump, left upper limb: Secondary | ICD-10-CM | POA: Diagnosis not present

## 2015-12-12 ENCOUNTER — Other Ambulatory Visit: Payer: Self-pay | Admitting: Internal Medicine

## 2015-12-19 ENCOUNTER — Other Ambulatory Visit: Payer: Self-pay | Admitting: Internal Medicine

## 2015-12-19 NOTE — Telephone Encounter (Signed)
Please advise 

## 2015-12-19 NOTE — Telephone Encounter (Signed)
New Message:    Pt says he would like to change from Pradaxa to Eliqius please. The Eliquis is less expensive.If Dr Lovena Le says this is alright,please call his medicine in.

## 2016-01-09 MED ORDER — APIXABAN 5 MG PO TABS: 5.0000 mg | ORAL_TABLET | Freq: Two times a day (BID) | ORAL | 11 refills | Status: DC

## 2016-01-09 NOTE — Telephone Encounter (Signed)
Discussed with Dr Lovena Le and okay to switch to Eliquis 5 mg twice daily.  Patient aware and medication called in

## 2016-01-19 ENCOUNTER — Other Ambulatory Visit: Payer: Self-pay | Admitting: Internal Medicine

## 2016-01-19 NOTE — Telephone Encounter (Signed)
°*  STAT* If patient is at the pharmacy, call can be transferred to refill team.   1. Which medications need to be refilled? (please list name of each medication and dose if known) Metorprolol-Pt needs this today,it will need authorization and pt did not realize this,he is really sorry about this last minute notice-Pt is going out of town tomorrow 2. Which pharmacy/location (including street and city if local pharmacy) is medication to be sent to?[facility] [facility] 3. Do they need a 30 day or 90 day supply? 30 and refills

## 2016-02-05 DIAGNOSIS — M7522 Bicipital tendinitis, left shoulder: Secondary | ICD-10-CM | POA: Diagnosis not present

## 2016-02-05 DIAGNOSIS — Z6825 Body mass index (BMI) 25.0-25.9, adult: Secondary | ICD-10-CM | POA: Diagnosis not present

## 2016-02-05 DIAGNOSIS — M25512 Pain in left shoulder: Secondary | ICD-10-CM | POA: Diagnosis not present

## 2016-02-13 DIAGNOSIS — M25512 Pain in left shoulder: Secondary | ICD-10-CM | POA: Diagnosis not present

## 2016-02-25 ENCOUNTER — Other Ambulatory Visit: Payer: Self-pay | Admitting: Internal Medicine

## 2016-02-25 DIAGNOSIS — I480 Paroxysmal atrial fibrillation: Secondary | ICD-10-CM

## 2016-03-07 DIAGNOSIS — Z125 Encounter for screening for malignant neoplasm of prostate: Secondary | ICD-10-CM | POA: Diagnosis not present

## 2016-03-07 DIAGNOSIS — E784 Other hyperlipidemia: Secondary | ICD-10-CM | POA: Diagnosis not present

## 2016-03-12 DIAGNOSIS — R69 Illness, unspecified: Secondary | ICD-10-CM | POA: Diagnosis not present

## 2016-03-14 DIAGNOSIS — M25512 Pain in left shoulder: Secondary | ICD-10-CM | POA: Diagnosis not present

## 2016-03-14 DIAGNOSIS — N401 Enlarged prostate with lower urinary tract symptoms: Secondary | ICD-10-CM | POA: Diagnosis not present

## 2016-03-14 DIAGNOSIS — M7522 Bicipital tendinitis, left shoulder: Secondary | ICD-10-CM | POA: Diagnosis not present

## 2016-03-14 DIAGNOSIS — E784 Other hyperlipidemia: Secondary | ICD-10-CM | POA: Diagnosis not present

## 2016-03-14 DIAGNOSIS — I480 Paroxysmal atrial fibrillation: Secondary | ICD-10-CM | POA: Diagnosis not present

## 2016-03-14 DIAGNOSIS — J45998 Other asthma: Secondary | ICD-10-CM | POA: Diagnosis not present

## 2016-03-14 DIAGNOSIS — Z0000 Encounter for general adult medical examination without abnormal findings: Secondary | ICD-10-CM | POA: Diagnosis not present

## 2016-03-14 DIAGNOSIS — D7589 Other specified diseases of blood and blood-forming organs: Secondary | ICD-10-CM | POA: Diagnosis not present

## 2016-03-14 DIAGNOSIS — E875 Hyperkalemia: Secondary | ICD-10-CM | POA: Diagnosis not present

## 2016-07-13 DIAGNOSIS — H00019 Hordeolum externum unspecified eye, unspecified eyelid: Secondary | ICD-10-CM | POA: Diagnosis not present

## 2016-07-13 DIAGNOSIS — I482 Chronic atrial fibrillation: Secondary | ICD-10-CM | POA: Diagnosis not present

## 2016-07-16 DIAGNOSIS — H00012 Hordeolum externum right lower eyelid: Secondary | ICD-10-CM | POA: Diagnosis not present

## 2016-08-20 DIAGNOSIS — H2513 Age-related nuclear cataract, bilateral: Secondary | ICD-10-CM | POA: Diagnosis not present

## 2016-08-20 DIAGNOSIS — H524 Presbyopia: Secondary | ICD-10-CM | POA: Diagnosis not present

## 2016-08-28 DIAGNOSIS — M25512 Pain in left shoulder: Secondary | ICD-10-CM | POA: Diagnosis not present

## 2016-09-04 DIAGNOSIS — M25512 Pain in left shoulder: Secondary | ICD-10-CM | POA: Diagnosis not present

## 2016-09-17 DIAGNOSIS — S43432A Superior glenoid labrum lesion of left shoulder, initial encounter: Secondary | ICD-10-CM | POA: Diagnosis not present

## 2016-09-17 DIAGNOSIS — M75102 Unspecified rotator cuff tear or rupture of left shoulder, not specified as traumatic: Secondary | ICD-10-CM | POA: Diagnosis not present

## 2016-09-17 DIAGNOSIS — M19012 Primary osteoarthritis, left shoulder: Secondary | ICD-10-CM | POA: Diagnosis not present

## 2016-10-04 ENCOUNTER — Other Ambulatory Visit: Payer: Self-pay | Admitting: Internal Medicine

## 2016-10-04 DIAGNOSIS — I480 Paroxysmal atrial fibrillation: Secondary | ICD-10-CM

## 2016-11-09 ENCOUNTER — Other Ambulatory Visit: Payer: Self-pay | Admitting: Internal Medicine

## 2016-11-09 DIAGNOSIS — I480 Paroxysmal atrial fibrillation: Secondary | ICD-10-CM

## 2016-11-11 DIAGNOSIS — B351 Tinea unguium: Secondary | ICD-10-CM | POA: Diagnosis not present

## 2016-11-11 DIAGNOSIS — Z6823 Body mass index (BMI) 23.0-23.9, adult: Secondary | ICD-10-CM | POA: Diagnosis not present

## 2016-11-11 DIAGNOSIS — Z23 Encounter for immunization: Secondary | ICD-10-CM | POA: Diagnosis not present

## 2016-11-11 DIAGNOSIS — B029 Zoster without complications: Secondary | ICD-10-CM | POA: Diagnosis not present

## 2016-11-17 ENCOUNTER — Other Ambulatory Visit: Payer: Self-pay | Admitting: Internal Medicine

## 2016-11-17 DIAGNOSIS — I480 Paroxysmal atrial fibrillation: Secondary | ICD-10-CM

## 2016-11-30 ENCOUNTER — Other Ambulatory Visit: Payer: Self-pay | Admitting: Internal Medicine

## 2016-11-30 DIAGNOSIS — I480 Paroxysmal atrial fibrillation: Secondary | ICD-10-CM

## 2017-01-02 ENCOUNTER — Other Ambulatory Visit: Payer: Self-pay | Admitting: Internal Medicine

## 2017-01-02 NOTE — Telephone Encounter (Signed)
Lab results received from Dr Perini's office Creat 0.8 on 03/14/16.  Based on specified criteria pt is on appropriate dosage of Eliquis 5mg  BID.  Will refill rx x 1 month until OV with Dr Lovena Le 02/04/17.

## 2017-01-02 NOTE — Telephone Encounter (Signed)
Pt last saw Dr Lovena Le 09/21/15, overdue for follow-up, pt has appt scheduled for 02/04/17 with Dr Lovena Le.  Last labwork in chart is from 2015.  Called Dr Joylene Draft office pt's primary MD, requested more recent lab results.  Age 71, weight 85.4kg, will await recent lab results and refill rx until pt can get to appt with Dr Lovena Le in January.

## 2017-01-07 ENCOUNTER — Other Ambulatory Visit: Payer: Self-pay | Admitting: Internal Medicine

## 2017-01-15 DIAGNOSIS — M1990 Unspecified osteoarthritis, unspecified site: Secondary | ICD-10-CM | POA: Insufficient documentation

## 2017-01-15 DIAGNOSIS — F419 Anxiety disorder, unspecified: Secondary | ICD-10-CM | POA: Insufficient documentation

## 2017-01-15 DIAGNOSIS — K219 Gastro-esophageal reflux disease without esophagitis: Secondary | ICD-10-CM | POA: Insufficient documentation

## 2017-01-15 DIAGNOSIS — R351 Nocturia: Secondary | ICD-10-CM | POA: Insufficient documentation

## 2017-01-15 DIAGNOSIS — E7800 Pure hypercholesterolemia, unspecified: Secondary | ICD-10-CM | POA: Insufficient documentation

## 2017-01-15 DIAGNOSIS — Z8489 Family history of other specified conditions: Secondary | ICD-10-CM | POA: Insufficient documentation

## 2017-02-04 ENCOUNTER — Ambulatory Visit: Payer: Medicare HMO | Admitting: Internal Medicine

## 2017-02-04 ENCOUNTER — Other Ambulatory Visit: Payer: Self-pay | Admitting: Internal Medicine

## 2017-02-04 DIAGNOSIS — I480 Paroxysmal atrial fibrillation: Secondary | ICD-10-CM

## 2017-02-05 ENCOUNTER — Other Ambulatory Visit: Payer: Self-pay | Admitting: Internal Medicine

## 2017-02-10 DIAGNOSIS — R69 Illness, unspecified: Secondary | ICD-10-CM | POA: Diagnosis not present

## 2017-02-17 DIAGNOSIS — M25511 Pain in right shoulder: Secondary | ICD-10-CM | POA: Diagnosis not present

## 2017-02-17 DIAGNOSIS — M7531 Calcific tendinitis of right shoulder: Secondary | ICD-10-CM | POA: Diagnosis not present

## 2017-03-07 ENCOUNTER — Other Ambulatory Visit: Payer: Self-pay | Admitting: Internal Medicine

## 2017-03-18 DIAGNOSIS — R69 Illness, unspecified: Secondary | ICD-10-CM | POA: Diagnosis not present

## 2017-03-21 ENCOUNTER — Ambulatory Visit: Payer: Medicare HMO | Admitting: Internal Medicine

## 2017-03-21 ENCOUNTER — Encounter: Payer: Self-pay | Admitting: Internal Medicine

## 2017-03-21 VITALS — BP 120/78 | HR 65 | Ht 76.0 in | Wt 187.0 lb

## 2017-03-21 DIAGNOSIS — Z79899 Other long term (current) drug therapy: Secondary | ICD-10-CM

## 2017-03-21 DIAGNOSIS — I495 Sick sinus syndrome: Secondary | ICD-10-CM

## 2017-03-21 DIAGNOSIS — I480 Paroxysmal atrial fibrillation: Secondary | ICD-10-CM | POA: Diagnosis not present

## 2017-03-21 DIAGNOSIS — Z5181 Encounter for therapeutic drug level monitoring: Secondary | ICD-10-CM

## 2017-03-21 NOTE — Patient Instructions (Signed)

## 2017-03-21 NOTE — Progress Notes (Signed)
HPI Joel [name] returns today for followup. He is a very pleasant 72 year old man with a history of paroxysmal atrial fibrillation, who is been maintained in sinus rhythm on flecainide. He remains active, dancing competitively. Since I saw him last 18 months ago, he feels much better and he has had only a couple of episodes of afib breakthrough. He takes an extra 50 mg of flecainide when this occurs. No other complaints although he appears more anxious than usual.  Allergies  Allergen Reactions  . Penicillins     REACTION: hives     Current Outpatient Medications  Medication Sig Dispense Refill  . atorvastatin (LIPITOR) 10 MG tablet Take 1 tablet (10 mg total) by mouth daily. 90 tablet 3  . cetirizine (ZYRTEC) 10 MG tablet Take 10 mg by mouth daily.    . Coenzyme Q10 (CO Q 10 PO) Take 1 capsule by mouth daily.    Marland Kitchen ELIQUIS 5 MG TABS tablet TAKE 1 TABLET BY MOUTH TWICE A DAY 60 tablet 5  . flecainide (TAMBOCOR) 100 MG tablet Take 1 tablet by mouth twice a day may take extra 1/2 tablet daily as needed, call to make appt with Dr. Lovena Le, 37 tablet 0  . flecainide (TAMBOCOR) 100 MG tablet TAKE ONE TAB (100 MG) BY MOUTH TWICE DAILY. TAKE HALF TAB (50 MG) BY MOUTH DAILY AS NEEDED FOR AFIB 75 tablet 1  . fluticasone (FLONASE) 50 MCG/ACT nasal spray Place 1 spray into both nostrils daily as needed for allergies or rhinitis.     . Glucosamine HCl (GLUCOSAMINE PO) Take 1 tablet by mouth daily.    . metoprolol succinate (TOPROL-XL) 25 MG 24 hr tablet Take 1/2 tablet by mouth daily. Patient needs to keep appointment for further refills 15 tablet 1  . Multiple Vitamin (MULTIVITAMIN) capsule Take 1 capsule by mouth daily.     No current facility-administered medications for this visit.      Past Medical History:  Diagnosis Date  . Anxiety   . Arthritis   . Atrial fibrillation ([place])   . Benign prostatic hypertrophy   . Family history of anesthesia complication    "MOTHER DEVELOPED  DEMENTIA AFTER SURGERY"  . GERD (gastroesophageal reflux disease)    INTERMITANT -NO MEDS  . Hx of adenomatous polyp of colon 05/23/2009  . Hypercholesterolemia   . Memory loss   . Nocturia     ROS:   All systems reviewed and negative except as noted in the HPI.   Past Surgical History:  Procedure Laterality Date  . APPENDECTOMY    . COLONOSCOPY W/ POLYPECTOMY    . PLATE NECK  6160   TITANIUM PLATE IN NECK  . TRANSURETHRAL RESECTION OF PROSTATE N/A 08/06/2013   Procedure: TRANSURETHRAL RESECTION OF THE PROSTATE (TURP) WITH GYRUS, irrigation of bladder stone, cystoscope;  Surgeon: Ailene Rud, MD;  Location: [facility];  Service: Urology;  Laterality: N/A;     Family History  Problem Relation Age of Onset  . Hypertension Mother   . Hyperlipidemia Mother   . Arrhythmia Father   . Stroke Father        36s  . Hypertension Sister      Social History   Socioeconomic History  . Marital status: Married    Spouse name: Not on file  . Number of children: Not on file  . Years of education: Not on file  . Highest education level: Not on file  Social Needs  . Financial resource strain:  Not on file  . Food insecurity - worry: Not on file  . Food insecurity - inability: Not on file  . Transportation needs - medical: Not on file  . Transportation needs - non-medical: Not on file  Occupational History  . Occupation: psychologist  Tobacco Use  . Smoking status: Former Smoker    Packs/day: 1.00    Types: Cigarettes    Last attempt to quit: 01/21/1990    Years since quitting: 27.1  . Smokeless tobacco: Former Systems developer    Quit date: 08/02/1993  Substance and Sexual Activity  . Alcohol use: Yes    Alcohol/week: 4.8 oz    Types: 8 Glasses of wine per week    Comment: 2 DRINKS 4-5 TIMES PER WK  . Drug use: No  . Sexual activity: Not on file  Other Topics Concern  . Not on file  Social History Narrative   Son is mentally handicapped;   Mother has moved into the home      BP 120/78 (BP Location: Left Arm, Patient Position: Sitting, Cuff Size: Normal)   Pulse 65   Ht 6\' 4"  (1.93 m)   Wt 187 lb (84.8 kg)   SpO2 98%   BMI 22.76 kg/m   Physical Exam:  Well appearing NAD HEENT: Unremarkable Neck:  No JVD, no thyromegally Lymphatics:  No adenopathy Back:  No CVA tenderness Lungs:  Clear with no wheezes HEART:  Regular rate rhythm, no murmurs, no rubs, no clicks Abd:  soft, positive bowel sounds, no organomegally, no rebound, no guarding Ext:  2 plus pulses, no edema, no cyanosis, no clubbing Skin:  No rashes no nodules Neuro:  CN II through XII intact, motor grossly intact  EKG - nsr   Assess/Plan: 1. PAF - he is maintaining NSR very nicely on flecainide. He will continue his current meds. 2. Coags - he has had no bleeding problems. Continue Eliquis. 3. Sinus node dysfunction - he is asymptomatic. No indication for PPM.  Joel [name].D.

## 2017-04-05 ENCOUNTER — Other Ambulatory Visit: Payer: Self-pay | Admitting: Internal Medicine

## 2017-04-05 DIAGNOSIS — I480 Paroxysmal atrial fibrillation: Secondary | ICD-10-CM

## 2017-04-17 DIAGNOSIS — R82998 Other abnormal findings in urine: Secondary | ICD-10-CM | POA: Diagnosis not present

## 2017-04-17 DIAGNOSIS — Z0000 Encounter for general adult medical examination without abnormal findings: Secondary | ICD-10-CM | POA: Diagnosis not present

## 2017-04-17 DIAGNOSIS — Z125 Encounter for screening for malignant neoplasm of prostate: Secondary | ICD-10-CM | POA: Diagnosis not present

## 2017-04-17 DIAGNOSIS — E7849 Other hyperlipidemia: Secondary | ICD-10-CM | POA: Diagnosis not present

## 2017-04-20 ENCOUNTER — Other Ambulatory Visit: Payer: Self-pay | Admitting: Internal Medicine

## 2017-04-20 DIAGNOSIS — I480 Paroxysmal atrial fibrillation: Secondary | ICD-10-CM

## 2017-04-24 DIAGNOSIS — E785 Hyperlipidemia, unspecified: Secondary | ICD-10-CM | POA: Diagnosis not present

## 2017-04-24 DIAGNOSIS — E291 Testicular hypofunction: Secondary | ICD-10-CM | POA: Diagnosis not present

## 2017-04-24 DIAGNOSIS — D7589 Other specified diseases of blood and blood-forming organs: Secondary | ICD-10-CM | POA: Diagnosis not present

## 2017-04-24 DIAGNOSIS — Z6823 Body mass index (BMI) 23.0-23.9, adult: Secondary | ICD-10-CM | POA: Diagnosis not present

## 2017-04-24 DIAGNOSIS — N401 Enlarged prostate with lower urinary tract symptoms: Secondary | ICD-10-CM | POA: Diagnosis not present

## 2017-04-24 DIAGNOSIS — J45998 Other asthma: Secondary | ICD-10-CM | POA: Diagnosis not present

## 2017-04-24 DIAGNOSIS — Z0000 Encounter for general adult medical examination without abnormal findings: Secondary | ICD-10-CM | POA: Diagnosis not present

## 2017-04-24 DIAGNOSIS — I480 Paroxysmal atrial fibrillation: Secondary | ICD-10-CM | POA: Diagnosis not present

## 2017-04-24 DIAGNOSIS — R0683 Snoring: Secondary | ICD-10-CM | POA: Diagnosis not present

## 2017-04-25 DIAGNOSIS — Z1212 Encounter for screening for malignant neoplasm of rectum: Secondary | ICD-10-CM | POA: Diagnosis not present

## 2017-06-13 DIAGNOSIS — D72829 Elevated white blood cell count, unspecified: Secondary | ICD-10-CM | POA: Diagnosis not present

## 2017-07-14 DIAGNOSIS — B029 Zoster without complications: Secondary | ICD-10-CM | POA: Diagnosis not present

## 2017-07-14 DIAGNOSIS — Z6824 Body mass index (BMI) 24.0-24.9, adult: Secondary | ICD-10-CM | POA: Diagnosis not present

## 2017-08-08 DIAGNOSIS — J019 Acute sinusitis, unspecified: Secondary | ICD-10-CM | POA: Diagnosis not present

## 2017-08-08 DIAGNOSIS — H1033 Unspecified acute conjunctivitis, bilateral: Secondary | ICD-10-CM | POA: Diagnosis not present

## 2017-08-08 DIAGNOSIS — R05 Cough: Secondary | ICD-10-CM | POA: Diagnosis not present

## 2017-08-08 DIAGNOSIS — Z6823 Body mass index (BMI) 23.0-23.9, adult: Secondary | ICD-10-CM | POA: Diagnosis not present

## 2017-08-13 DIAGNOSIS — R69 Illness, unspecified: Secondary | ICD-10-CM | POA: Diagnosis not present

## 2017-08-18 ENCOUNTER — Telehealth: Payer: Self-pay | Admitting: Internal Medicine

## 2017-08-18 NOTE — Telephone Encounter (Signed)
New Message      1. What dental office are you calling from? Dr. Mathis Fare  2. What is your office phone number? [phone]  3. What is your fax number?[phone]  4. What type of procedure is the patient having performed? Upper lip Biopsy   5. What date is procedure scheduled or is the patient there now? 08/19/2017   6. What is your question (ex. Antibiotics prior to procedure, holding medication-we need to know how long dentist wants pt to hold med)? Patient is on  Eliquis is it ok to keep appointment and continue Eliquis?

## 2017-08-19 DIAGNOSIS — R69 Illness, unspecified: Secondary | ICD-10-CM | POA: Diagnosis not present

## 2017-08-19 NOTE — Telephone Encounter (Signed)
Spoke with [facility] @ Dr. Purvis Sheffield office. She states that she contacted our office 08/18/17 @ 11:00 a.m. And requested clearance. Pt really needed biopsy of his lip, so Dr. Diona Foley felt like pt would be ok so they went ahead and performed the procedure since they hadn't heard back re: clearance, and the pt really needed it.  Will close encounter and take out of preop pool.

## 2017-08-19 NOTE — Telephone Encounter (Signed)
Procedure date will need to be clarified, we received clearance request on reported day of procedure.  Pt takes Eliquis for afib, has hx of TIA. Renal function was normal when last checked over 1 year ago. Recommend only holding Eliquis for 1 day max due to hx of TIA.

## 2017-08-19 NOTE — Telephone Encounter (Signed)
Pharm please address Eliquis thanks °

## 2017-08-19 NOTE — Telephone Encounter (Signed)
   [facility] Pre-operative Risk Assessment    Request for surgical clearance:  1. What type of surgery is being performed? Upper lip biopsy   2. When is this surgery scheduled? 08/19/2017  3. What type of clearance is required (medical clearance vs. Pharmacy clearance to hold med vs. Both)? pharmacy  4. Are there any medications that need to be held prior to surgery and how long? Eliquis   5. Practice name and name of physician performing surgery? Dr. Mathis Fare  6. What is your office phone number [phone]    7.   What is your office fax number [phone]  8.   Anesthesia type (None, local, MAC, general) ?  None requested  ---see previous message   Joel [name] 08/19/2017, 7:04 AM  _________________________________________________________________   (provider comments below)

## 2017-08-19 NOTE — Telephone Encounter (Signed)
Please call pt and find date of procedure.  I rec'd this today.

## 2017-08-28 DIAGNOSIS — K1123 Chronic sialoadenitis: Secondary | ICD-10-CM | POA: Diagnosis not present

## 2017-08-28 DIAGNOSIS — K115 Sialolithiasis: Secondary | ICD-10-CM | POA: Diagnosis not present

## 2017-09-03 ENCOUNTER — Other Ambulatory Visit: Payer: Self-pay | Admitting: Internal Medicine

## 2017-11-13 DIAGNOSIS — R69 Illness, unspecified: Secondary | ICD-10-CM | POA: Diagnosis not present

## 2018-01-19 DIAGNOSIS — R0789 Other chest pain: Secondary | ICD-10-CM | POA: Diagnosis not present

## 2018-01-19 DIAGNOSIS — K219 Gastro-esophageal reflux disease without esophagitis: Secondary | ICD-10-CM | POA: Diagnosis not present

## 2018-01-19 DIAGNOSIS — Z6824 Body mass index (BMI) 24.0-24.9, adult: Secondary | ICD-10-CM | POA: Diagnosis not present

## 2018-01-19 DIAGNOSIS — I480 Paroxysmal atrial fibrillation: Secondary | ICD-10-CM | POA: Diagnosis not present

## 2018-01-29 ENCOUNTER — Encounter (INDEPENDENT_AMBULATORY_CARE_PROVIDER_SITE_OTHER): Payer: Self-pay

## 2018-01-29 ENCOUNTER — Telehealth: Payer: Self-pay | Admitting: *Deleted

## 2018-01-29 ENCOUNTER — Encounter: Payer: Self-pay | Admitting: Internal Medicine

## 2018-01-29 ENCOUNTER — Encounter: Payer: Self-pay | Admitting: Physician Assistant

## 2018-01-29 ENCOUNTER — Ambulatory Visit: Payer: Medicare HMO | Admitting: Physician Assistant

## 2018-01-29 VITALS — BP 124/70 | HR 76 | Ht 76.0 in | Wt 194.0 lb

## 2018-01-29 DIAGNOSIS — R0789 Other chest pain: Secondary | ICD-10-CM | POA: Diagnosis not present

## 2018-01-29 DIAGNOSIS — Z86010 Personal history of colonic polyps: Secondary | ICD-10-CM | POA: Diagnosis not present

## 2018-01-29 DIAGNOSIS — R12 Heartburn: Secondary | ICD-10-CM | POA: Diagnosis not present

## 2018-01-29 DIAGNOSIS — Z1211 Encounter for screening for malignant neoplasm of colon: Secondary | ICD-10-CM

## 2018-01-29 DIAGNOSIS — K219 Gastro-esophageal reflux disease without esophagitis: Secondary | ICD-10-CM | POA: Diagnosis not present

## 2018-01-29 MED ORDER — PANTOPRAZOLE SODIUM 40 MG PO TBEC: 40.0000 mg | DELAYED_RELEASE_TABLET | Freq: Every day | ORAL | 3 refills | Status: DC

## 2018-01-29 NOTE — Telephone Encounter (Signed)
[facility] Pre-operative Risk Assessment     Request for surgical clearance:     Endoscopy Procedure  What type of surgery is being performed?     Endoscopy  When is this surgery scheduled?     02-05-2018  What type of clearance is required ?   Pharmacy  Are there any medications that need to be held prior to surgery and how long? Eliquis- Dr. Cristopher Peru manages the Eliquis for the patient.  Practice name and name of physician performing surgery?      [facility]  What is your office phone and fax number?      Phone- [phone]  [phone]  Anesthesia type (None, local, MAC, general) ?       MAC

## 2018-01-29 NOTE — Patient Instructions (Addendum)
We sent refills for Pantoprazole sodium 40 mg ( Protonix) to [facility] [place] .  We have provided you with an anti-reflux diet.  We will call you with the Eliquis clearance instructions from Dr. Cristopher Peru.   You have been scheduled for an endoscopy. Please follow written instructions given to you at your visit today. If you use inhalers (even only as needed), please bring them with you on the day of your procedure. Your physician has requested that you go to www.startemmi.com and enter the access code given to you at your visit today. This web site gives a general overview about your procedure. However, you should still follow specific instructions given to you by our office regarding your preparation for the procedure.

## 2018-01-29 NOTE — Progress Notes (Addendum)
Subjective:    Patient ID: Joel [name], male    DOB: [birth year], 73 y.o.   MRN: [id number]  HPI Bueford is a pleasant 73 year old white male, known to Dr. Carlean Purl who is referred today per Dr. Silvestre Mesi office for evaluation of GERD. Patient was last seen here in January 2017 when he had colonoscopy.  He has history of adenomatous colon polyps and family history of colon cancer in his sister. Colonoscopy with finding of 2 5 to 7 mm polyps which were tubular adenomas and otherwise negative exam.  He is indicated for 5-year interval follow-up. He has not had prior EGD. Patient says in the past he has had occasional nocturnal symptoms with acid reflux waking him up from sleep.  He would usually takes Tums which was helpful.  Over the past couple months, he developed more frequent symptoms, at least 3 nights per week.  He treated himself with a course of OTC Prilosec with some improvement in symptoms would keep returning.  He eventually developed tightness and burning as well as pressure in his lower chest generally relieved by belching and burping.  He denies any dysphagia or odynophagia.  No nausea or vomiting. He has altered his diet, backed off on caffeine citrus etc. and has also decreased alcohol use.  He was seen at Dr. Tempie Donning office and started on Protonix 40 mg p.o. daily about a week and a half ago and says that symptoms are probably 80% improved. He is also had some mild changes in his bowel habits.  He says he is always been very regular and having 2-3 bowel movements per day.  In past months he and his wife were eating large amounts of salads and vegetables and had recently backed off on that because they were tired of the diet.  Since then he has been having 1-2 bowel movements per day and at times does not have a bowel movement.  He has not noticed any blood, has no abdominal discomfort.  Review of Systems Pertinent positive and negative review of systems were noted in the above HPI  section.  All other review of systems was otherwise negative.  Outpatient Encounter Medications as of 01/29/2018  Medication Sig  . atorvastatin (LIPITOR) 10 MG tablet Take 1 tablet (10 mg total) by mouth daily.  . cetirizine (ZYRTEC) 10 MG tablet Take 10 mg by mouth daily as needed.   . Coenzyme Q10 (CO Q 10 PO) Take 1 capsule by mouth daily.  Marland Kitchen ELIQUIS 5 MG TABS tablet TAKE 1 TABLET BY MOUTH TWICE A DAY  . flecainide (TAMBOCOR) 100 MG tablet TAKE ONE TAB BY MOUTH TWICE DAILY. TAKE 1/2 TAB BY MOUTH DAILY AS NEEDED FOR AFIB  . fluticasone (FLONASE) 50 MCG/ACT nasal spray Place 1 spray into both nostrils daily as needed for allergies or rhinitis.   . metoprolol succinate (TOPROL-XL) 25 MG 24 hr tablet Take 1/2 tablet by mouth daily.  . Multiple Vitamin (MULTIVITAMIN) capsule Take 1 capsule by mouth daily.  . pantoprazole (PROTONIX) 40 MG tablet Take 1 tablet (40 mg total) by mouth daily before breakfast.  . [DISCONTINUED] pantoprazole (PROTONIX) 40 MG tablet Take 40 mg by mouth daily before breakfast.  . [DISCONTINUED] Glucosamine HCl (GLUCOSAMINE PO) Take 1 tablet by mouth daily.   No facility-administered encounter medications on file as of 01/29/2018.    Allergies  Allergen Reactions  . Penicillins     REACTION: hives   Patient Active Problem List   Diagnosis Date Noted  .  Nocturia   . Hypercholesterolemia   . GERD (gastroesophageal reflux disease)   . Family history of anesthesia complication   . Arthritis   . Anxiety   . Family history of colon cancer - sister 01/26/2015  . Sinus node dysfunction ([place]) 02/23/2014  . TIA (transient ischemic attack) 02/23/2014  . HYPERCHOLESTEROLEMIA 10/02/2009  . ATRIAL FIBRILLATION 10/02/2009  . MEMORY LOSS 10/02/2009  . ELEVATED BLOOD PRESSURE 10/02/2009  . BENIGN PROSTATIC HYPERTROPHY, HX OF 10/02/2009  . Hx of adenomatous polyp of colon 05/23/2009   Social History   Socioeconomic History  . Marital status: Married    Spouse name: Bethena Roys    . Number of children: 0  . Years of education: Not on file  . Highest education level: Not on file  Occupational History  . Occupation: psychologist  Social Needs  . Financial resource strain: Not on file  . Food insecurity:    Worry: Not on file    Inability: Not on file  . Transportation needs:    Medical: Not on file    Non-medical: Not on file  Tobacco Use  . Smoking status: Former Smoker    Packs/day: 1.00    Types: Cigarettes    Last attempt to quit: 01/21/1990    Years since quitting: 28.0  . Smokeless tobacco: Never Used  Substance and Sexual Activity  . Alcohol use: Yes    Alcohol/week: 8.0 standard drinks    Types: 8 Glasses of wine per week    Comment: 2 DRINKS 4-5 TIMES PER WK  . Drug use: No  . Sexual activity: Not on file  Lifestyle  . Physical activity:    Days per week: Not on file    Minutes per session: Not on file  . Stress: Not on file  Relationships  . Social connections:    Talks on phone: Not on file    Gets together: Not on file    Attends religious service: Not on file    Active member of club or organization: Not on file    Attends meetings of clubs or organizations: Not on file    Relationship status: Not on file  . Intimate partner violence:    Fear of current or ex partner: Not on file    Emotionally abused: Not on file    Physically abused: Not on file    Forced sexual activity: Not on file  Other Topics Concern  . Not on file  Social History Narrative   Step Son is mentally handicapped;   Mother has moved into the home    Mr. Ledger's family history includes Arrhythmia in his father; GER disease in his mother and sister; Hyperlipidemia in his mother; Hypertension in his mother and sister; Other in his sister; Stroke in his father.      Objective:    Vitals:   01/29/18 1125  BP: 124/70  Pulse: 76    Physical Exam; well-developed older white male, in no acute distress, accompanied by his wife, both pleasant blood pressure  124/70, pulse 76, BMI 23.6.  HEENT;nontraumatic normocephalic EOMI PERRLA sclera anicteric oral mucosa moist, Cardiovascular; regular rate and rhythm with S1-S2 no murmur rub or gallop, Pulmonary; clear bilaterally, Abdomen ;soft, nontender nondistended bowel sounds are active, no palpable mass or hepatosplenomegaly.  Rectal; exam not done, Extremities; no clubbing cyanosis or edema skin warm dry, Neuropsych; alert and oriented, grossly nonfocal mood and affect appropriate       Assessment & Plan:   #35 73 year old white  male with recent development of persistent tightness and burning in the esophagus associated with pressure, belching and burping and frequent nocturnal reflux. He is improved about 80% after starting Protonix 40 mg p.o. daily 1-1/2 weeks ago. Symptoms are consistent with acid reflux, he may have had component of acute esophagitis.  longer term intermittent nocturnal reflux  Rule out Barrett's esophagus, rule out esophageal lesion  #2 history of adenomatous colon polyps-up-to-date with colonoscopy last done January 2017 due for follow-up January 2022 #3 recent mild change in bowel habits.  Likely related to diet  #4 atrial fibrillation-on Eliquis #5.  Prior history of TIA #6.  BPH #7.  Family history of colon cancer in patient's sister  Plan; Continue Protonix 40 mg p.o. every morning.  Patient raised question about continuing "long-term" PPI.Marland Kitchen We discussed this at length, he has just recently started PPI therapy clearly has indication for good acid suppression.  He may require chronic PPI therapy.  For now we will plan to treat for 3 to 4 months, then depending on response and EGD, can determine appropriateness of creasing dose and/or giving trial of H2 blocker. Continue strict antireflux regimen, including n.p.o. for 2 to 3 hours prior to bedtime Patient will be scheduled for upper endoscopy with Dr. Carlean Purl.  Procedure was discussed in detail with the patient including  indications risks benefits and he is agreeable to proceed. He will need to hold Eliquis for 24 to 48 hours prior to procedure.  We will communicate with his cardiologist Dr. Lovena Le to assure this is reasonable for this patient. Follow-up colonoscopy 2022     Alfredia Ferguson PA-C 01/29/2018   Cc: Crist Infante, MD   Agree with Ms. Genia Harold assessment and plan. Gatha Mayer, MD, [facility]

## 2018-01-30 NOTE — Telephone Encounter (Signed)
   Primary Cardiologist: Cristopher Peru, MD  Chart reviewed as part of pre-operative protocol coverage. Patient was contacted 01/30/2018 in reference to pre-operative risk assessment for pending surgery as outlined below.  Joel [name] was last seen on Dr. Lovena Le 03/2017 - has h/o PAF, sinus node dysfunction, anxiety, HLD, memory loss, BPH, remote TIA.  Last Cr 12/2017 was 0.8 indicating GFR above 90 (Jennifer Witty pulled up via [place]). Per discussion with patient, he is feeling well without new cardiovascular symptoms. He still has occasional breakthrough palpitations but these are not rapid or sustained. Dr. Tanna Furry note 03/2017 indicates he was aware of these. The pt walks and jogs without angina or dyspnea. Therefore, based on ACC/AHA guidelines, the patient would be at acceptable risk for the planned procedure without further cardiovascular testing.   In the past when anticoagulation was held, it was recommended he only hold for 1 day (24 hours) prior to procedure due to TIA. The same concept would apply here - would recommend to hold 24 hours prior to endoscopy. We typically advise that blood thinners be resumed as soon as felt safe by performing physician. He was advised to call the office if he develops increased burden of palpitations prior to procedure at which time Dr. Lovena Le will need to be involved in plan of care.  I will route this recommendation to the requesting party via Epic fax function and remove from pre-op pool.  Please call with questions.  Charlie Pitter, PA-C 01/30/2018, 3:01 PM   Crist Infante, MD

## 2018-02-02 ENCOUNTER — Telehealth: Payer: Self-pay | Admitting: *Deleted

## 2018-02-02 NOTE — Telephone Encounter (Signed)
Spoke to patient on Monday 02-02-18.  Advised him per Dr. Cristopher Peru, he is to hold the Eliquis on Wed. 1-15-,Thursday 1-16 and Dr. Carlean Purl will advise him to resume the Eliquis. The patient verbalized understanding the directions.

## 2018-02-05 ENCOUNTER — Encounter: Payer: Self-pay | Admitting: Internal Medicine

## 2018-02-05 ENCOUNTER — Ambulatory Visit (AMBULATORY_SURGERY_CENTER): Payer: Medicare HMO | Admitting: Internal Medicine

## 2018-02-05 VITALS — BP 110/60 | HR 50 | Temp 96.2°F | Resp 15 | Ht 76.0 in | Wt 194.0 lb

## 2018-02-05 DIAGNOSIS — I4891 Unspecified atrial fibrillation: Secondary | ICD-10-CM | POA: Diagnosis not present

## 2018-02-05 DIAGNOSIS — K317 Polyp of stomach and duodenum: Secondary | ICD-10-CM | POA: Diagnosis not present

## 2018-02-05 DIAGNOSIS — Z8673 Personal history of transient ischemic attack (TIA), and cerebral infarction without residual deficits: Secondary | ICD-10-CM | POA: Diagnosis not present

## 2018-02-05 DIAGNOSIS — K219 Gastro-esophageal reflux disease without esophagitis: Secondary | ICD-10-CM

## 2018-02-05 MED ORDER — SODIUM CHLORIDE 0.9 % IV SOLN: 500.0000 mL | Freq: Once | INTRAVENOUS | Status: DC

## 2018-02-05 NOTE — Patient Instructions (Addendum)
There were some benign-appearing stomach polyps that I biopsied. usually not of consequence.  The esophagus looks great.  I will let you know biopsy results and plans.  For now stay on the pantoprazole - I think you should be able to at least reduce the dose vs change to Pepcid.  Reflux regimen      - Do not lie down for at least 3 to 4 hours after meals.       - Raise the head of the bed 4 to 6 inches.       - Decrease excess weight.       - Avoid citrus juices and other acidic foods, alcohol, chocolate, mints, coffee and other caffeinated beverages, carbonated beverages, fatty and fried foods.       - Avoid tight-fitting clothing.       - Avoid cigarettes and other tobacco products.    I appreciate the opportunity to care for you. Gatha Mayer, MD, FACG YOU HAD AN ENDOSCOPIC PROCEDURE TODAY AT [facility]:   Refer to the procedure report that was given to you for any specific questions about what was found during the examination.  If the procedure report does not answer your questions, please call your gastroenterologist to clarify.  If you requested that your care partner not be given the details of your procedure findings, then the procedure report has been included in a sealed envelope for you to review at your convenience later.  YOU SHOULD EXPECT: Some feelings of bloating in the abdomen. Passage of more gas than usual.  Walking can help get rid of the air that was put into your GI tract during the procedure and reduce the bloating. If you had a lower endoscopy (such as a colonoscopy or flexible sigmoidoscopy) you may notice spotting of blood in your stool or on the toilet paper.   Please Note:  You might notice some irritation and congestion in your nose or some drainage.  This is from the oxygen used during your procedure.  There is no need for concern and it should clear up in a day or so.  SYMPTOMS TO REPORT IMMEDIATELY:   Following upper  endoscopy (EGD)  Vomiting of blood or coffee ground material  New chest pain or pain under the shoulder blades  Painful or persistently difficult swallowing  New shortness of breath  Fever of 100F or higher  Black, tarry-looking stools  For urgent or emergent issues, a gastroenterologist can be reached at any hour by calling [phone].   DIET:  We do recommend a small meal at first, but then you may proceed to your regular diet.  Drink plenty of fluids but you should avoid alcoholic beverages for 24 hours.  ACTIVITY:  You should plan to take it easy for the rest of today and you should NOT DRIVE or use heavy machinery until tomorrow (because of the sedation medicines used during the test).    FOLLOW UP: Our staff will call the number listed on your records the next business day following your procedure to check on you and address any questions or concerns that you may have regarding the information given to you following your procedure. If we do not reach you, we will leave a message.  However, if you are feeling well and you are not experiencing any problems, there is no need to return our call.  We will assume that you have returned to your regular daily activities without incident.  If any biopsies were taken you will be contacted by phone or by letter within the next 1-3 weeks.  Please call us at [phone] if you have not heard about the biopsies in 3 weeks.   You may start your eliquis tomorrow.  SIGNATURES/CONFIDENTIALITY: You and/or your care partner have signed paperwork which will be entered into your electronic medical record.  These signatures attest to the fact that that the information above on your After Visit Summary has been reviewed and is understood.  Full responsibility of the confidentiality of this discharge information lies with you and/or your care-partner.  Read all handouts given to you by your recovery room nurse.

## 2018-02-05 NOTE — Op Note (Signed)
[facility] Patient Name: Joel [name] Procedure Date: 02/05/2018 9:09 AM MRN: [id number] Endoscopist: Gatha Mayer , MD Age: 73 Referring MD:  Date of Birth: [birth year] Gender: Male Account #: [id number] Procedure:                Upper GI endoscopy Indications:              Heartburn, Suspected esophageal reflux Medicines:                Propofol per Anesthesia, Monitored Anesthesia Care Procedure:                Pre-Anesthesia Assessment:                           - Prior to the procedure, a History and Physical                            was performed, and patient medications and                            allergies were reviewed. The patient's tolerance of                            previous anesthesia was also reviewed. The risks                            and benefits of the procedure and the sedation                            options and risks were discussed with the patient.                            All questions were answered, and informed consent                            was obtained. Prior Anticoagulants: The patient                            last took Eliquis (apixaban) 1 day prior to the                            procedure. ASA Grade Assessment: III - A patient                            with severe systemic disease. After reviewing the                            risks and benefits, the patient was deemed in                            satisfactory condition to undergo the procedure.                           After obtaining informed consent, the endoscope was  passed under direct vision. Throughout the                            procedure, the patient's blood pressure, pulse, and                            oxygen saturations were monitored continuously. The                            Endoscope was introduced through the mouth, and                            advanced to the second part of duodenum. The upper             GI endoscopy was accomplished without difficulty.                            The patient tolerated the procedure well. Scope In: Scope Out: Findings:                 Multiple diminutive sessile polyps with no stigmata                            of recent bleeding were found in the gastric fundus                            and in the gastric body. Biopsies were taken with a                            cold forceps for histology. Verification of patient                            identification for the specimen was done. Estimated                            blood loss was minimal.                           The exam was otherwise without abnormality.                           The cardia and gastric fundus were normal on                            retroflexion. Complications:            No immediate complications. Impression:               - Multiple gastric polyps. Biopsied.                           - The examination was otherwise normal. Recommendation:           - Patient has a contact number available for  emergencies. The signs and symptoms of potential                            delayed complications were discussed with the                            patient. Return to normal activities tomorrow.                            Written discharge instructions were provided to the                            patient.                           - Resume previous diet.                           - Continue present medications.                           - Await pathology results.                           - Follow an antireflux regimen.                           - He has responded quickly to PPi so may be able to                            reduce dose, change to famoticine or perhaps no                            medication with lifestyle changes.                           will advise more after pathology review.                           - Resume Eliquis  (apixaban) at prior dose tomorrow. Gatha Mayer, MD 02/05/2018 9:54:08 AM This report has been signed electronically.

## 2018-02-05 NOTE — Progress Notes (Signed)
Called to room to assist during endoscopic procedure.  Patient ID and intended procedure confirmed with present staff. Received instructions for my participation in the procedure from the performing physician.  

## 2018-02-05 NOTE — Progress Notes (Signed)
Report given to PACU, vss 

## 2018-02-06 ENCOUNTER — Telehealth: Payer: Self-pay | Admitting: *Deleted

## 2018-02-06 NOTE — Telephone Encounter (Signed)
  Follow up Call-  Call back number 02/05/2018  Post procedure Call Back phone  # [phone]  Permission to leave phone message Yes  Some recent data might be hidden     Patient questions:  Do you have a fever, pain , or abdominal swelling? No. Pain Score  0 *  Have you tolerated food without any problems? Yes.    Have you been able to return to your normal activities? Yes.    Do you have any questions about your discharge instructions: Diet   No. Medications  No. Follow up visit  No.  Do you have questions or concerns about your Care? No.  Actions: * If pain score is 4 or above: No action needed, pain <4.

## 2018-02-16 DIAGNOSIS — R69 Illness, unspecified: Secondary | ICD-10-CM | POA: Diagnosis not present

## 2018-02-20 ENCOUNTER — Encounter: Payer: Self-pay | Admitting: Internal Medicine

## 2018-02-20 DIAGNOSIS — D131 Benign neoplasm of stomach: Secondary | ICD-10-CM

## 2018-02-20 DIAGNOSIS — K317 Polyp of stomach and duodenum: Secondary | ICD-10-CM

## 2018-02-20 HISTORY — DX: Polyp of stomach and duodenum: K31.7

## 2018-02-20 HISTORY — DX: Benign neoplasm of stomach: D13.1

## 2018-02-20 NOTE — Progress Notes (Signed)
Hyperplastic gastric polyps All diminutive Will recheck with 01/2020 colonoscopy recall Also have him stay on PPI and schedule a f/u me for April

## 2018-03-07 ENCOUNTER — Other Ambulatory Visit: Payer: Self-pay | Admitting: Internal Medicine

## 2018-03-09 NOTE — Telephone Encounter (Signed)
Pt last saw Dr Lovena Le 03/21/17, has upcoming appt scheduled for 04/08/18.  Last labs 01/19/18 Creat 0.8 at [facility], age 73, weight 88kg, based on specified criteria pt is on appropriate dosage of Eliquis 5mg  BID.  Will refill rx.

## 2018-03-11 DIAGNOSIS — M546 Pain in thoracic spine: Secondary | ICD-10-CM | POA: Diagnosis not present

## 2018-03-11 DIAGNOSIS — Z6824 Body mass index (BMI) 24.0-24.9, adult: Secondary | ICD-10-CM | POA: Diagnosis not present

## 2018-03-11 DIAGNOSIS — M545 Low back pain: Secondary | ICD-10-CM | POA: Diagnosis not present

## 2018-03-17 DIAGNOSIS — M545 Low back pain: Secondary | ICD-10-CM | POA: Diagnosis not present

## 2018-03-17 DIAGNOSIS — M546 Pain in thoracic spine: Secondary | ICD-10-CM | POA: Diagnosis not present

## 2018-03-17 DIAGNOSIS — M25511 Pain in right shoulder: Secondary | ICD-10-CM | POA: Diagnosis not present

## 2018-03-18 ENCOUNTER — Encounter: Payer: Medicare HMO | Admitting: Internal Medicine

## 2018-03-20 DIAGNOSIS — Z7901 Long term (current) use of anticoagulants: Secondary | ICD-10-CM | POA: Diagnosis not present

## 2018-03-20 DIAGNOSIS — I480 Paroxysmal atrial fibrillation: Secondary | ICD-10-CM | POA: Diagnosis not present

## 2018-03-20 DIAGNOSIS — Z6823 Body mass index (BMI) 23.0-23.9, adult: Secondary | ICD-10-CM | POA: Diagnosis not present

## 2018-03-20 DIAGNOSIS — K6289 Other specified diseases of anus and rectum: Secondary | ICD-10-CM | POA: Diagnosis not present

## 2018-03-20 DIAGNOSIS — K610 Anal abscess: Secondary | ICD-10-CM | POA: Diagnosis not present

## 2018-03-23 ENCOUNTER — Other Ambulatory Visit: Payer: Self-pay

## 2018-03-23 ENCOUNTER — Inpatient Hospital Stay (HOSPITAL_COMMUNITY)
Admission: EM | Admit: 2018-03-23 | Discharge: 2018-03-25 | DRG: 395 | Disposition: A | Discharge status: Home / Self Care | Payer: Medicare HMO | Attending: Internal Medicine | Admitting: Internal Medicine

## 2018-03-23 ENCOUNTER — Encounter (HOSPITAL_COMMUNITY): Payer: Self-pay

## 2018-03-23 ENCOUNTER — Emergency Department (HOSPITAL_COMMUNITY): Payer: Medicare HMO

## 2018-03-23 DIAGNOSIS — Z8489 Family history of other specified conditions: Secondary | ICD-10-CM | POA: Diagnosis not present

## 2018-03-23 DIAGNOSIS — R69 Illness, unspecified: Secondary | ICD-10-CM | POA: Diagnosis not present

## 2018-03-23 DIAGNOSIS — K611 Rectal abscess: Secondary | ICD-10-CM | POA: Diagnosis not present

## 2018-03-23 DIAGNOSIS — M1990 Unspecified osteoarthritis, unspecified site: Secondary | ICD-10-CM | POA: Diagnosis not present

## 2018-03-23 DIAGNOSIS — Z800 Family history of malignant neoplasm of digestive organs: Secondary | ICD-10-CM

## 2018-03-23 DIAGNOSIS — Z79899 Other long term (current) drug therapy: Secondary | ICD-10-CM | POA: Diagnosis not present

## 2018-03-23 DIAGNOSIS — Z881 Allergy status to other antibiotic agents status: Secondary | ICD-10-CM | POA: Diagnosis not present

## 2018-03-23 DIAGNOSIS — K219 Gastro-esophageal reflux disease without esophagitis: Secondary | ICD-10-CM | POA: Diagnosis not present

## 2018-03-23 DIAGNOSIS — Z9079 Acquired absence of other genital organ(s): Secondary | ICD-10-CM

## 2018-03-23 DIAGNOSIS — F419 Anxiety disorder, unspecified: Secondary | ICD-10-CM | POA: Diagnosis present

## 2018-03-23 DIAGNOSIS — Z880 Allergy status to penicillin: Secondary | ICD-10-CM

## 2018-03-23 DIAGNOSIS — E7800 Pure hypercholesterolemia, unspecified: Secondary | ICD-10-CM | POA: Diagnosis not present

## 2018-03-23 DIAGNOSIS — Z7901 Long term (current) use of anticoagulants: Secondary | ICD-10-CM | POA: Diagnosis not present

## 2018-03-23 DIAGNOSIS — N400 Enlarged prostate without lower urinary tract symptoms: Secondary | ICD-10-CM | POA: Diagnosis not present

## 2018-03-23 DIAGNOSIS — Z86010 Personal history of colonic polyps: Secondary | ICD-10-CM | POA: Diagnosis not present

## 2018-03-23 DIAGNOSIS — Z823 Family history of stroke: Secondary | ICD-10-CM

## 2018-03-23 DIAGNOSIS — I4891 Unspecified atrial fibrillation: Secondary | ICD-10-CM | POA: Diagnosis not present

## 2018-03-23 DIAGNOSIS — I480 Paroxysmal atrial fibrillation: Secondary | ICD-10-CM | POA: Diagnosis present

## 2018-03-23 DIAGNOSIS — Z87891 Personal history of nicotine dependence: Secondary | ICD-10-CM

## 2018-03-23 DIAGNOSIS — Z8349 Family history of other endocrine, nutritional and metabolic diseases: Secondary | ICD-10-CM | POA: Diagnosis not present

## 2018-03-23 DIAGNOSIS — Z8249 Family history of ischemic heart disease and other diseases of the circulatory system: Secondary | ICD-10-CM | POA: Diagnosis not present

## 2018-03-23 DIAGNOSIS — K610 Anal abscess: Secondary | ICD-10-CM | POA: Diagnosis not present

## 2018-03-23 LAB — CBC WITH DIFFERENTIAL/PLATELET
Abs Immature Granulocytes: 0.02 10*3/uL (ref 0.00–0.07)
Basophils Absolute: 0 10*3/uL (ref 0.0–0.1)
Basophils Relative: 0 %
Eosinophils Absolute: 0.1 10*3/uL (ref 0.0–0.5)
Eosinophils Relative: 1 %
HCT: 42.2 % (ref 39.0–52.0)
HEMOGLOBIN: 14.2 g/dL (ref 13.0–17.0)
Immature Granulocytes: 0 %
Lymphocytes Relative: 27 %
Lymphs Abs: 3.1 10*3/uL (ref 0.7–4.0)
MCH: 32.2 pg (ref 26.0–34.0)
MCHC: 33.6 g/dL (ref 30.0–36.0)
MCV: 95.7 fL (ref 80.0–100.0)
MONO ABS: 1.1 10*3/uL — AB (ref 0.1–1.0)
Monocytes Relative: 10 %
Neutro Abs: 6.9 10*3/uL (ref 1.7–7.7)
Neutrophils Relative %: 62 %
Platelets: 237 10*3/uL (ref 150–400)
RBC: 4.41 MIL/uL (ref 4.22–5.81)
RDW: 12 % (ref 11.5–15.5)
WBC: 11.2 10*3/uL — ABNORMAL HIGH (ref 4.0–10.5)
nRBC: 0 % (ref 0.0–0.2)

## 2018-03-23 LAB — COMPREHENSIVE METABOLIC PANEL
ALK PHOS: 55 U/L (ref 38–126)
ALT: 17 U/L (ref 0–44)
AST: 22 U/L (ref 15–41)
Albumin: 3.3 g/dL — ABNORMAL LOW (ref 3.5–5.0)
Anion gap: 10 (ref 5–15)
BUN: 10 mg/dL (ref 8–23)
CO2: 22 mmol/L (ref 22–32)
Calcium: 8.7 mg/dL — ABNORMAL LOW (ref 8.9–10.3)
Chloride: 103 mmol/L (ref 98–111)
Creatinine, Ser: 0.89 mg/dL (ref 0.61–1.24)
GFR calc Af Amer: >60 mL/min (ref >60)
GFR calc non Af Amer: >60 mL/min (ref >60)
Glucose, Bld: 96 mg/dL (ref 70–99)
Potassium: 4.3 mmol/L (ref 3.5–5.1)
SODIUM: 135 mmol/L (ref 135–145)
Total Bilirubin: 0.7 mg/dL (ref 0.3–1.2)
Total Protein: 6.7 g/dL (ref 6.5–8.1)

## 2018-03-23 MED ORDER — MULTIVITAMINS PO CAPS: 1.0000 | ORAL_CAPSULE | Freq: Every day | ORAL | Status: DC

## 2018-03-23 MED ORDER — METOPROLOL SUCCINATE ER 25 MG PO TB24
12.5000 mg | ORAL_TABLET | Freq: Every day | ORAL | Status: DC
  Administered 2018-03-23 – 2018-03-24 (×2): 12.5 mg via ORAL
  Filled 2018-03-23 (×2): qty 1

## 2018-03-23 MED ORDER — METRONIDAZOLE IN NACL 5-0.79 MG/ML-% IV SOLN
500.0000 mg | Freq: Three times a day (TID) | INTRAVENOUS | Status: DC
  Administered 2018-03-24 – 2018-03-25 (×5): 500 mg via INTRAVENOUS
  Filled 2018-03-23 (×5): qty 100

## 2018-03-23 MED ORDER — IOHEXOL 300 MG/ML  SOLN
100.0000 mL | Freq: Once | INTRAMUSCULAR | Status: AC | PRN
  Administered 2018-03-23: 100 mL via INTRAVENOUS

## 2018-03-23 MED ORDER — ACETAMINOPHEN 650 MG RE SUPP: 650.0000 mg | Freq: Four times a day (QID) | RECTAL | Status: DC | PRN

## 2018-03-23 MED ORDER — ACETAMINOPHEN 325 MG PO TABS
650.0000 mg | ORAL_TABLET | Freq: Four times a day (QID) | ORAL | Status: DC | PRN
  Administered 2018-03-25: 650 mg via ORAL
  Filled 2018-03-23: qty 2

## 2018-03-23 MED ORDER — PANTOPRAZOLE SODIUM 40 MG PO TBEC
40.0000 mg | DELAYED_RELEASE_TABLET | Freq: Every day | ORAL | Status: DC
  Administered 2018-03-24: 40 mg via ORAL
  Filled 2018-03-23: qty 1

## 2018-03-23 MED ORDER — SODIUM CHLORIDE 0.9 % IV SOLN
INTRAVENOUS | Status: DC
  Administered 2018-03-24 (×2): via INTRAVENOUS

## 2018-03-23 MED ORDER — ATORVASTATIN CALCIUM 10 MG PO TABS
10.0000 mg | ORAL_TABLET | Freq: Every day | ORAL | Status: DC
  Administered 2018-03-23 – 2018-03-24 (×2): 10 mg via ORAL
  Filled 2018-03-23 (×2): qty 1

## 2018-03-23 MED ORDER — FLECAINIDE ACETATE 100 MG PO TABS
100.0000 mg | ORAL_TABLET | Freq: Two times a day (BID) | ORAL | Status: DC
  Administered 2018-03-24 – 2018-03-25 (×3): 100 mg via ORAL
  Filled 2018-03-23 (×5): qty 1

## 2018-03-23 MED ORDER — ONDANSETRON HCL 4 MG/2ML IJ SOLN: 4.0000 mg | Freq: Four times a day (QID) | INTRAMUSCULAR | Status: DC | PRN

## 2018-03-23 MED ORDER — METRONIDAZOLE IN NACL 5-0.79 MG/ML-% IV SOLN
500.0000 mg | Freq: Once | INTRAVENOUS | Status: AC
  Administered 2018-03-23: 500 mg via INTRAVENOUS
  Filled 2018-03-23: qty 100

## 2018-03-23 MED ORDER — ADULT MULTIVITAMIN W/MINERALS CH
1.0000 | ORAL_TABLET | Freq: Every day | ORAL | Status: DC
  Administered 2018-03-24 – 2018-03-25 (×2): 1 via ORAL
  Filled 2018-03-23 (×2): qty 1

## 2018-03-23 MED ORDER — ONDANSETRON HCL 4 MG PO TABS: 4.0000 mg | ORAL_TABLET | Freq: Four times a day (QID) | ORAL | Status: DC | PRN

## 2018-03-23 MED ORDER — CLINDAMYCIN PHOSPHATE 600 MG/50ML IV SOLN
600.0000 mg | Freq: Once | INTRAVENOUS | Status: AC
  Administered 2018-03-23: 600 mg via INTRAVENOUS
  Filled 2018-03-23: qty 50

## 2018-03-23 MED ORDER — HYDROCODONE-ACETAMINOPHEN 5-325 MG PO TABS: 1.0000 | ORAL_TABLET | Freq: Four times a day (QID) | ORAL | Status: DC | PRN

## 2018-03-23 MED ORDER — CLINDAMYCIN PHOSPHATE 600 MG/50ML IV SOLN
600.0000 mg | Freq: Four times a day (QID) | INTRAVENOUS | Status: DC
  Administered 2018-03-24 – 2018-03-25 (×6): 600 mg via INTRAVENOUS
  Filled 2018-03-23 (×7): qty 50

## 2018-03-23 NOTE — ED Provider Notes (Signed)
[facility] EMERGENCY DEPARTMENT Provider Note   CSN: [id number] Arrival date & time: 03/23/18  1526    History   Chief Complaint Chief Complaint  Patient presents with  . Abscess    HPI Joel [name] is a 73 y.o. male.     HPI Patient with history of recurrent perirectal abscesses and atrial fibrillation on Eliquis.  States he developed swelling and pain in the rectal region 4 days ago.  Was evaluated by surgery in the office 3 days ago.  Had attempted aspiration of the abscess which was unsuccessful.  Was started on Flagyl and doxycycline at that time.  States he has had persistent, worsening perirectal pain.  He has had fevers up to 101.5, chills and diaphoresis.  Called his surgeon today and was instructed to come to the emergency department for CT scan.  Patient did take his Eliquis this morning.  He has had no perirectal drainage or bleeding. Past Medical History:  Diagnosis Date  . Allergy   . Anxiety   . Arthritis   . Atrial fibrillation ([place])   . Benign prostatic hypertrophy   . Family history of anesthesia complication    "MOTHER DEVELOPED DEMENTIA AFTER SURGERY"  . Gastric polyps - hyperplastic 02/20/2018  . GERD (gastroesophageal reflux disease)    INTERMITANT -NO MEDS  . Hx of adenomatous polyp of colon 05/23/2009  . Hypercholesterolemia   . Memory loss   . Nocturia     Patient Active Problem List   Diagnosis Date Noted  . Gastric polyps - hyperplastic 02/20/2018  . Nocturia   . Hypercholesterolemia   . GERD (gastroesophageal reflux disease)   . Family history of anesthesia complication   . Arthritis   . Anxiety   . Family history of colon cancer - sister 01/26/2015  . Sinus node dysfunction ([place]) 02/23/2014  . TIA (transient ischemic attack) 02/23/2014  . HYPERCHOLESTEROLEMIA 10/02/2009  . ATRIAL FIBRILLATION 10/02/2009  . MEMORY LOSS 10/02/2009  . ELEVATED BLOOD PRESSURE 10/02/2009  . BENIGN PROSTATIC HYPERTROPHY, HX OF  10/02/2009  . Hx of adenomatous polyp of colon 05/23/2009    Past Surgical History:  Procedure Laterality Date  . APPENDECTOMY    . COLONOSCOPY W/ POLYPECTOMY    . COLOSTOMY    . PLATE NECK  0350   TITANIUM PLATE IN NECK  . TRANSURETHRAL RESECTION OF PROSTATE N/A 08/06/2013   Procedure: TRANSURETHRAL RESECTION OF THE PROSTATE (TURP) WITH GYRUS, irrigation of bladder stone, cystoscope;  Surgeon: Ailene Rud, MD;  Location: [facility] ORS;  Service: Urology;  Laterality: N/A;        Home Medications    Prior to Admission medications   Medication Sig Start Date End Date Taking? Authorizing Provider  atorvastatin (LIPITOR) 10 MG tablet Take 1 tablet (10 mg total) by mouth daily. 03/07/15   Evans Lance, MD  cetirizine (ZYRTEC) 10 MG tablet Take 10 mg by mouth daily as needed.     [provider]  Coenzyme Q10 (CO Q 10 PO) Take 1 capsule by mouth daily.    [provider]  ELIQUIS 5 MG TABS tablet TAKE 1 TABLET BY MOUTH TWICE A DAY 03/09/18   Evans Lance, MD  flecainide (TAMBOCOR) 100 MG tablet TAKE ONE TAB BY MOUTH TWICE DAILY. TAKE 1/2 TAB BY MOUTH DAILY AS NEEDED FOR AFIB 04/22/17   Evans Lance, MD  fluticasone [facility]) 50 MCG/ACT nasal spray Place 1 spray into both nostrils daily as needed for allergies or  rhinitis.     [provider]  metoprolol succinate (TOPROL-XL) 25 MG 24 hr tablet Take 1/2 tablet by mouth daily. 04/22/17   Evans Lance, MD  Multiple Vitamin (MULTIVITAMIN) capsule Take 1 capsule by mouth daily.    [provider]  pantoprazole (PROTONIX) 40 MG tablet Take 1 tablet (40 mg total) by mouth daily before breakfast. 01/29/18   Esterwood, Amy S, PA-C    Family History Family History  Problem Relation Age of Onset  . Hypertension Mother   . Hyperlipidemia Mother   . GER disease Mother   . Arrhythmia Father   . Stroke Father        35s  . Hypertension Sister   . GER disease Sister   . Other Sister        tumor  removed from her colon, possible pre-cancerous, foot of her bowel removed  . Colon cancer Sister   . Esophageal cancer Other   . Stomach cancer Other   . Rectal cancer Other     Social History Social History   Tobacco Use  . Smoking status: Former Smoker    Packs/day: 1.00    Types: Cigarettes    Last attempt to quit: 01/21/1990    Years since quitting: 28.1  . Smokeless tobacco: Never Used  Substance Use Topics  . Alcohol use: Yes    Alcohol/week: 8.0 standard drinks    Types: 8 Glasses of wine per week    Comment: 2 DRINKS 4-5 TIMES PER WK  . Drug use: No     Allergies   Penicillins   Review of Systems Review of Systems  Constitutional: Positive for chills, diaphoresis and fever.  Gastrointestinal: Positive for rectal pain. Negative for abdominal pain.  Neurological: Negative for weakness and numbness.  All other systems reviewed and are negative.    Physical Exam Updated Vital Signs BP 127/85   Pulse 66   Temp 98.3 F (36.8 C) (Oral)   Resp 16   SpO2 97%   Physical Exam Vitals signs and nursing note reviewed.  Constitutional:      General: He is not in acute distress.    Appearance: Normal appearance. He is well-developed. He is not ill-appearing.  HENT:     Head: Normocephalic and atraumatic.  Eyes:     Pupils: Pupils are equal, round, and reactive to light.  Neck:     Musculoskeletal: Normal range of motion and neck supple.  Cardiovascular:     Rate and Rhythm: Normal rate and regular rhythm.  Pulmonary:     Effort: Pulmonary effort is normal.     Breath sounds: Normal breath sounds.  Abdominal:     General: Bowel sounds are normal.     Palpations: Abdomen is soft.     Tenderness: There is no abdominal tenderness. There is no guarding or rebound.  Genitourinary:   Musculoskeletal: Normal range of motion.        General: No tenderness.  Skin:    General: Skin is warm and dry.     Capillary Refill: Capillary refill takes less than 2 seconds.      Findings: No erythema or rash.  Neurological:     General: No focal deficit present.     Mental Status: He is alert and oriented to person, place, and time.  Psychiatric:        Mood and Affect: Mood normal.        Behavior: Behavior normal.      ED Treatments /  Results  Labs (all labs ordered are listed, but only abnormal results are displayed) Labs Reviewed  CBC WITH DIFFERENTIAL/PLATELET - Abnormal; Notable for the following components:      Result Value   WBC 11.2 (*)    Monocytes Absolute 1.1 (*)    All other components within normal limits  COMPREHENSIVE METABOLIC PANEL - Abnormal; Notable for the following components:   Calcium 8.7 (*)    Albumin 3.3 (*)    All other components within normal limits    EKG None  Radiology Ct Pelvis W Contrast  Result Date: 03/23/2018 CLINICAL DATA:  Perianal abscess.  Pain. EXAM: CT PELVIS WITH CONTRAST TECHNIQUE: Multidetector CT imaging of the pelvis was performed using the standard protocol following the bolus administration of intravenous contrast. CONTRAST:  166mL OMNIPAQUE IOHEXOL 300 MG/ML  SOLN COMPARISON:  None. FINDINGS: Urinary Tract: Distal ureters and urinary bladder are within normal limits. Bowel: Visualized small bowel is normal. Visualized ascending colon is normal. Appendix is not visualized and may be surgically absent. Descending and sigmoid colon are within normal limits. Rectum is unremarkable. A left perianal fluid collection measures 2.1 x 2.8 x 2.3 cm. There are some associated inflammatory changes. Vascular/Lymphatic: No pathologically enlarged lymph nodes. No significant vascular abnormality seen. Reproductive:  Prostate is enlarged, measuring 5.7 cm. Musculoskeletal: Osseous pelvis is within normal limits. Hips are located and within normal limits bilaterally. IMPRESSION: 1. Left perianal fluid collection/abscess measures 2.1 x 2.8 x 2.3 cm without further extension into the pelvis. 2. Enlarged prostate gland.  Electronically Signed   By: San Morelle M.D.   On: 03/23/2018 18:17    Procedures Procedures (including critical care time)  Medications Ordered in ED Medications  clindamycin (CLEOCIN) IVPB 600 mg (600 mg Intravenous New Bag/Given 03/23/18 1917)  metroNIDAZOLE (FLAGYL) IVPB 500 mg (has no administration in time range)  iohexol (OMNIPAQUE) 300 MG/ML solution 100 mL (100 mLs Intravenous Contrast Given 03/23/18 1759)     Initial Impression / Assessment and Plan / ED Course  I have reviewed the triage vital signs and the nursing notes.  Pertinent labs & imaging results that were available during my care of the patient were reviewed by me and considered in my medical decision making (see chart for details).       Discussed with general surgery.  Advised n.p.o. after midnight and medicine admit.  Will hold Eliquis.  Plan for I&D tomorrow. Initiated IV clindamycin and Flagyl.  Discussed with hospitalist who will see patient in the emergency department and admit.   Final Clinical Impressions(s) / ED Diagnoses   Final diagnoses:  Perirectal abscess    ED Discharge Orders    None       Julianne Rice, MD 03/23/18 1942

## 2018-03-23 NOTE — ED Notes (Signed)
ED TO INPATIENT HANDOFF REPORT  ED Nurse Name and Phone #: Gwenlyn Perking [phone]  S Name/Age/Gender Joel [name] 73 y.o. male Room/Bed: 040C/040C  Code Status   Code Status: Full Code  Home/SNF/Other Patient oriented to: self, place, time and situation Is this baseline? Yes   Triage Complete: Triage complete  Chief Complaint Perianal Abscess (on blood thinners)  Triage Note Pt here with perianal pain due to an abscess there.  Pt see at surgical center and PA attempted to aspirate fluid but hesitant to lance due to pt being on blood thinners.  Pt woke up with increased pain and unable to get in a comfortable position.     Allergies Allergies  Allergen Reactions  . Ciprofloxacin Other (See Comments)    Is currently taking Tambocor.Marland KitchenMarland KitchenINTERACTION??  . Penicillins Hives and Rash    Did it involve swelling of the face/tongue/throat, SOB, or low BP? Yes Did it involve sudden or severe rash/hives, skin peeling, or any reaction on the inside of your mouth or nose? Unk Did you need to seek medical attention at a hospital or doctor's office? Yes When did it last happen?1970's If all above answers are "NO", may proceed with cephalosporin use.     Level of Care/Admitting Diagnosis ED Disposition    ED Disposition Condition Comment   Admit  Hospital Area: [facility] [[id number]]  Level of Care: Med-Surg [16]  I expect the patient will be discharged within 24 hours: Yes  LOW acuity---Tx typically complete <24 hrs---ACUTE conditions typically can be evaluated <24 hours---LABS likely to return to acceptable levels <24 hours---IS near functional baseline---EXPECTED to return to current living arrangement---NOT newly hypoxic: Meets criteria for 5C-Observation unit  Diagnosis: Perianal abscess [035465]  Admitting Physician: Etta Quill [[id number]]  Attending Physician: Etta Quill [[id number]]  PT Class (Do Not Modify): Observation [104]  PT Acc Code (Do Not Modify):  Observation [10022]       B Medical/Surgery History Past Medical History:  Diagnosis Date  . Allergy   . Anxiety   . Arthritis   . Atrial fibrillation ([place])   . Benign prostatic hypertrophy   . Family history of anesthesia complication    "MOTHER DEVELOPED DEMENTIA AFTER SURGERY"  . Gastric polyps - hyperplastic 02/20/2018  . GERD (gastroesophageal reflux disease)    INTERMITANT -NO MEDS  . Hx of adenomatous polyp of colon 05/23/2009  . Hypercholesterolemia   . Memory loss   . Nocturia    Past Surgical History:  Procedure Laterality Date  . APPENDECTOMY    . COLONOSCOPY W/ POLYPECTOMY    . COLOSTOMY    . PLATE NECK  6812   TITANIUM PLATE IN NECK  . TRANSURETHRAL RESECTION OF PROSTATE N/A 08/06/2013   Procedure: TRANSURETHRAL RESECTION OF THE PROSTATE (TURP) WITH GYRUS, irrigation of bladder stone, cystoscope;  Surgeon: Ailene Rud, MD;  Location: [facility];  Service: Urology;  Laterality: N/A;     A IV Location/Drains/Wounds Patient Lines/Drains/Airways Status   Active Line/Drains/Airways    Name:   Placement date:   Placement time:   Site:   Days:   Peripheral IV 03/23/18 Right Wrist   03/23/18    1639    Wrist   less than 1   Urethral Catheter Dr Gaynelle Arabian   08/06/13    0955    -   1690          Intake/Output Last 24 hours No intake or output data in the 24 hours ending  03/23/18 2000  Labs/Imaging Results for orders placed or performed during the hospital encounter of 03/23/18 (from the past 48 hour(s))  CBC with Differential/Platelet     Status: Abnormal   Collection Time: 03/23/18  4:35 PM  Result Value Ref Range   WBC 11.2 (H) 4.0 - 10.5 K/uL   RBC 4.41 4.22 - 5.81 MIL/uL   Hemoglobin 14.2 13.0 - 17.0 g/dL   HCT 42.2 39.0 - 52.0 %   MCV 95.7 80.0 - 100.0 fL   MCH 32.2 26.0 - 34.0 pg   MCHC 33.6 30.0 - 36.0 g/dL   RDW 12.0 11.5 - 15.5 %   Platelets 237 150 - 400 K/uL   nRBC 0.0 0.0 - 0.2 %   Neutrophils Relative % 62 %   Neutro Abs 6.9 1.7 -  7.7 K/uL   Lymphocytes Relative 27 %   Lymphs Abs 3.1 0.7 - 4.0 K/uL   Monocytes Relative 10 %   Monocytes Absolute 1.1 (H) 0.1 - 1.0 K/uL   Eosinophils Relative 1 %   Eosinophils Absolute 0.1 0.0 - 0.5 K/uL   Basophils Relative 0 %   Basophils Absolute 0.0 0.0 - 0.1 K/uL   Immature Granulocytes 0 %   Abs Immature Granulocytes 0.02 0.00 - 0.07 K/uL    Comment: Performed at [facility], [address], [place], [place] [address]  Comprehensive metabolic panel     Status: Abnormal   Collection Time: 03/23/18  4:35 PM  Result Value Ref Range   Sodium 135 135 - 145 mmol/L   Potassium 4.3 3.5 - 5.1 mmol/L   Chloride 103 98 - 111 mmol/L   CO2 22 22 - 32 mmol/L   Glucose, Bld 96 70 - 99 mg/dL   BUN 10 8 - 23 mg/dL   Creatinine, Ser 0.89 0.61 - 1.24 mg/dL   Calcium 8.7 (L) 8.9 - 10.3 mg/dL   Total Protein 6.7 6.5 - 8.1 g/dL   Albumin 3.3 (L) 3.5 - 5.0 g/dL   AST 22 15 - 41 U/L   ALT 17 0 - 44 U/L   Alkaline Phosphatase 55 38 - 126 U/L   Total Bilirubin 0.7 0.3 - 1.2 mg/dL   GFR calc non Af Amer >60 >60 mL/min   GFR calc Af Amer >60 >60 mL/min   Anion gap 10 5 - 15    Comment: Performed at [facility] [address] [place], [place] [address]   Ct Pelvis W Contrast  Result Date: 03/23/2018 CLINICAL DATA:  Perianal abscess.  Pain. EXAM: CT PELVIS WITH CONTRAST TECHNIQUE: Multidetector CT imaging of the pelvis was performed using the standard protocol following the bolus administration of intravenous contrast. CONTRAST:  17mL OMNIPAQUE IOHEXOL 300 MG/ML  SOLN COMPARISON:  None. FINDINGS: Urinary Tract: Distal ureters and urinary bladder are within normal limits. Bowel: Visualized small bowel is normal. Visualized ascending colon is normal. Appendix is not visualized and may be surgically absent. Descending and sigmoid colon are within normal limits. Rectum is unremarkable. A left perianal fluid collection measures 2.1 x 2.8 x 2.3 cm. There are some associated inflammatory  changes. Vascular/Lymphatic: No pathologically enlarged lymph nodes. No significant vascular abnormality seen. Reproductive:  Prostate is enlarged, measuring 5.7 cm. Musculoskeletal: Osseous pelvis is within normal limits. Hips are located and within normal limits bilaterally. IMPRESSION: 1. Left perianal fluid collection/abscess measures 2.1 x 2.8 x 2.3 cm without further extension into the pelvis. 2. Enlarged prostate gland. Electronically Signed   By:  San Morelle M.D.   On: 03/23/2018 18:17    Pending Labs Unresulted Labs (From admission, onward)    Start     Ordered   03/23/18 1948  HIV antibody (Routine Testing)  Once,   R     03/23/18 1948          Vitals/Pain Today's Vitals   03/23/18 1630 03/23/18 1640 03/23/18 1730 03/23/18 1913  BP: 117/71  125/73 127/85  Pulse: 60  62 66  Resp: 16  16 16   Temp:      TempSrc:      SpO2: 98%  97% 97%  PainSc:  0-No pain      Isolation Precautions No active isolations  Medications Medications  metroNIDAZOLE (FLAGYL) IVPB 500 mg (has no administration in time range)  metroNIDAZOLE (FLAGYL) IVPB 500 mg (has no administration in time range)  acetaminophen (TYLENOL) tablet 650 mg (has no administration in time range)    Or  acetaminophen (TYLENOL) suppository 650 mg (has no administration in time range)  ondansetron (ZOFRAN) tablet 4 mg (has no administration in time range)    Or  ondansetron (ZOFRAN) injection 4 mg (has no administration in time range)  0.9 %  sodium chloride infusion (has no administration in time range)  clindamycin (CLEOCIN) IVPB 600 mg (has no administration in time range)  iohexol (OMNIPAQUE) 300 MG/ML solution 100 mL (100 mLs Intravenous Contrast Given 03/23/18 1759)  clindamycin (CLEOCIN) IVPB 600 mg (600 mg Intravenous New Bag/Given 03/23/18 1917)    Mobility walks with person assist Low fall risk   Focused Assessments    R Recommendations: See Admitting Provider Note  Report given to:    Additional Notes:

## 2018-03-23 NOTE — H&P (Signed)
History and Physical    Joel [name] ZCH:[id number] DOB: 19-May-1945 DOA: 03/23/2018  PCP: Crist Infante, MD  Patient coming from: Home  I have personally briefly reviewed patient's old medical records in [place]  Chief Complaint: Peri-anal abscess  HPI: Joel [name] is a 73 y.o. male with medical history significant of recurrent perirectal abscesses and atrial fibrillation on Eliquis.  States he developed swelling and pain in the rectal region 4 days ago.  Was evaluated by surgery in the office 3 days ago.  Had attempted aspiration of the abscess which was unsuccessful.  They didn't want to attempt I+D in office due to him being on eliquis.  Put on Flagyl and doxycycline at that time.  States he has had persistent, worsening perirectal pain.  He has had fevers up to 101.5, chills and diaphoresis.  Called his surgeon today and was instructed to come to the emergency department for CT scan.  Patient did take his Eliquis this morning.  He has had no perirectal drainage or bleeding.   ED Course: CT reveals peri-anal abscess.  Patient is allergic to PCN, unknown if allergic to cephalosporins, and on flecanide so cant take cipro.  Started on clinda/flagyl and hospitalist asked to admit.   Review of Systems: As per HPI otherwise 10 point review of systems negative.   Past Medical History:  Diagnosis Date  . Allergy   . Anxiety   . Arthritis   . Atrial fibrillation ([place])   . Benign prostatic hypertrophy   . Family history of anesthesia complication    "MOTHER DEVELOPED DEMENTIA AFTER SURGERY"  . Gastric polyps - hyperplastic 02/20/2018  . GERD (gastroesophageal reflux disease)    INTERMITANT -NO MEDS  . Hx of adenomatous polyp of colon 05/23/2009  . Hypercholesterolemia   . Memory loss   . Nocturia     Past Surgical History:  Procedure Laterality Date  . APPENDECTOMY    . COLONOSCOPY W/ POLYPECTOMY    . COLOSTOMY    . PLATE NECK  2878   TITANIUM PLATE IN NECK   . TRANSURETHRAL RESECTION OF PROSTATE N/A 08/06/2013   Procedure: TRANSURETHRAL RESECTION OF THE PROSTATE (TURP) WITH GYRUS, irrigation of bladder stone, cystoscope;  Surgeon: Ailene Rud, MD;  Location: [facility];  Service: Urology;  Laterality: N/A;     reports that he quit smoking about 28 years ago. His smoking use included cigarettes. He smoked 1.00 pack per day. He has never used smokeless tobacco. He reports current alcohol use of about 8.0 standard drinks of alcohol per week. He reports that he does not use drugs.  Allergies  Allergen Reactions  . Ciprofloxacin Other (See Comments)    Is currently taking Tambocor.Marland KitchenMarland KitchenINTERACTION??  . Penicillins Hives and Rash    Did it involve swelling of the face/tongue/throat, SOB, or low BP? Yes Did it involve sudden or severe rash/hives, skin peeling, or any reaction on the inside of your mouth or nose? Unk Did you need to seek medical attention at a hospital or doctor's office? Yes When did it last happen?1970's If all above answers are "NO", may proceed with cephalosporin use.     Family History  Problem Relation Age of Onset  . Hypertension Mother   . Hyperlipidemia Mother   . GER disease Mother   . Arrhythmia Father   . Stroke Father        82s  . Hypertension Sister   . GER disease Sister   . Other Sister  tumor removed from her colon, possible pre-cancerous, foot of her bowel removed  . Colon cancer Sister   . Esophageal cancer Other   . Stomach cancer Other   . Rectal cancer Other      Prior to Admission medications   Medication Sig Start Date End Date Taking? Authorizing Provider  atorvastatin (LIPITOR) 10 MG tablet Take 1 tablet (10 mg total) by mouth daily. 03/07/15  Yes Evans Lance, MD  cetirizine (ZYRTEC) 10 MG tablet Take 5 mg by mouth daily as needed (for seasonal allergies).    Yes [provider]  Coenzyme Q10 (CO Q 10 PO) Take 1 capsule by mouth at bedtime.    Yes [provider]   doxycycline (VIBRA-TABS) 100 MG tablet Take 100 mg by mouth 2 (two) times daily. FOR 7 DAYS 03/20/18 03/26/18 Yes [provider]  ELIQUIS 5 MG TABS tablet TAKE 1 TABLET BY MOUTH TWICE A DAY Patient taking differently: Take 5 mg by mouth 2 (two) times daily.  03/09/18  Yes Evans Lance, MD  flecainide (TAMBOCOR) 100 MG tablet TAKE ONE TAB BY MOUTH TWICE DAILY. TAKE 1/2 TAB BY MOUTH DAILY AS NEEDED FOR AFIB 04/22/17  Yes Evans Lance, MD  fluticasone [facility]) 50 MCG/ACT nasal spray Place 1 spray into both nostrils daily as needed for allergies or rhinitis.    Yes [provider]  metoprolol succinate (TOPROL-XL) 25 MG 24 hr tablet Take 1/2 tablet by mouth daily. Patient taking differently: Take 12.5 mg by mouth at bedtime.  04/22/17  Yes Evans Lance, MD  metroNIDAZOLE (FLAGYL) 500 MG tablet Take 500 mg by mouth 3 (three) times daily. FOR 7 DAYS 03/20/18 03/26/18 Yes [provider]  pantoprazole (PROTONIX) 40 MG tablet Take 1 tablet (40 mg total) by mouth daily before breakfast. 01/29/18  Yes Esterwood, Amy S, PA-C  Multiple Vitamin (MULTIVITAMIN) capsule Take 1 capsule by mouth daily.    [provider]    Physical Exam: Vitals:   03/23/18 1913 03/23/18 1930 03/23/18 1945 03/23/18 2000  BP: 127/85 112/78 114/78 124/82  Pulse: 66 67 75 70  Resp: 16   16  Temp:      TempSrc:      SpO2: 97% 95% 93% 97%    Constitutional: NAD, calm, comfortable Eyes: PERRL, lids and conjunctivae normal ENMT: Mucous membranes are moist. Posterior pharynx clear of any exudate or lesions.Normal dentition.  Neck: normal, supple, no masses, no thyromegaly Respiratory: clear to auscultation bilaterally, no wheezing, no crackles. Normal respiratory effort. No accessory muscle use.  Cardiovascular: Regular rate and rhythm, no murmurs / rubs / gallops. No extremity edema. 2+ pedal pulses. No carotid bruits.  Abdomen: no tenderness, no masses palpated. No hepatosplenomegaly. Bowel  sounds positive.  Musculoskeletal: no clubbing / cyanosis. No joint deformity upper and lower extremities. Good ROM, no contractures. Normal muscle tone.  Skin: no rashes, lesions, ulcers. No induration Neurologic: CN 2-12 grossly intact. Sensation intact, DTR normal. Strength 5/5 in all 4.  Psychiatric: Normal judgment and insight. Alert and oriented x 3. Normal mood.    Labs on Admission: I have personally reviewed following labs and imaging studies  CBC: Recent Labs  Lab 03/23/18 1635  WBC 11.2*  NEUTROABS 6.9  HGB 14.2  HCT 42.2  MCV 95.7  PLT 732   Basic Metabolic Panel: Recent Labs  Lab 03/23/18 1635  NA 135  K 4.3  CL 103  CO2 22  GLUCOSE 96  BUN 10  CREATININE  0.89  CALCIUM 8.7*   GFR: CrCl cannot be calculated (Unknown ideal weight.). Liver Function Tests: Recent Labs  Lab 03/23/18 1635  AST 22  ALT 17  ALKPHOS 55  BILITOT 0.7  PROT 6.7  ALBUMIN 3.3*   No results for input(s): LIPASE, AMYLASE in the last 168 hours. No results for input(s): AMMONIA in the last 168 hours. Coagulation Profile: No results for input(s): INR, PROTIME in the last 168 hours. Cardiac Enzymes: No results for input(s): CKTOTAL, CKMB, CKMBINDEX, TROPONINI in the last 168 hours. BNP (last 3 results) No results for input(s): PROBNP in the last 8760 hours. HbA1C: No results for input(s): HGBA1C in the last 72 hours. CBG: No results for input(s): GLUCAP in the last 168 hours. Lipid Profile: No results for input(s): CHOL, HDL, LDLCALC, TRIG, CHOLHDL, LDLDIRECT in the last 72 hours. Thyroid Function Tests: No results for input(s): TSH, T4TOTAL, FREET4, T3FREE, THYROIDAB in the last 72 hours. Anemia Panel: No results for input(s): VITAMINB12, FOLATE, FERRITIN, TIBC, IRON, RETICCTPCT in the last 72 hours. Urine analysis:    Component Value Date/Time   COLORURINE YELLOW 10/12/2009 1927   APPEARANCEUR CLEAR 10/12/2009 1927   LABSPEC 1.020 10/12/2009 1927   PHURINE 6.0  10/12/2009 1927   GLUCOSEU NEGATIVE 10/12/2009 1927   HGBUR NEGATIVE 10/12/2009 1927   BILIRUBINUR NEGATIVE 10/12/2009 1927   KETONESUR NEGATIVE 10/12/2009 1927   PROTEINUR NEGATIVE 10/12/2009 1927   UROBILINOGEN 0.2 10/12/2009 1927   NITRITE NEGATIVE 10/12/2009 1927   LEUKOCYTESUR  10/12/2009 1927    NEGATIVE MICROSCOPIC NOT DONE ON URINES WITH NEGATIVE PROTEIN, BLOOD, LEUKOCYTES, NITRITE, OR GLUCOSE <1000 mg/dL.    Radiological Exams on Admission: Ct Pelvis W Contrast  Result Date: 03/23/2018 CLINICAL DATA:  Perianal abscess.  Pain. EXAM: CT PELVIS WITH CONTRAST TECHNIQUE: Multidetector CT imaging of the pelvis was performed using the standard protocol following the bolus administration of intravenous contrast. CONTRAST:  122mL OMNIPAQUE IOHEXOL 300 MG/ML  SOLN COMPARISON:  None. FINDINGS: Urinary Tract: Distal ureters and urinary bladder are within normal limits. Bowel: Visualized small bowel is normal. Visualized ascending colon is normal. Appendix is not visualized and may be surgically absent. Descending and sigmoid colon are within normal limits. Rectum is unremarkable. A left perianal fluid collection measures 2.1 x 2.8 x 2.3 cm. There are some associated inflammatory changes. Vascular/Lymphatic: No pathologically enlarged lymph nodes. No significant vascular abnormality seen. Reproductive:  Prostate is enlarged, measuring 5.7 cm. Musculoskeletal: Osseous pelvis is within normal limits. Hips are located and within normal limits bilaterally. IMPRESSION: 1. Left perianal fluid collection/abscess measures 2.1 x 2.8 x 2.3 cm without further extension into the pelvis. 2. Enlarged prostate gland. Electronically Signed   By: San Morelle M.D.   On: 03/23/2018 18:17    EKG: Independently reviewed.  Assessment/Plan Principal Problem:   Perianal abscess Active Problems:   ATRIAL FIBRILLATION   Family history of anesthesia complication    1. Perianal abscess - 1. NPO after  MN 2. Plan for surgical I+D tomorrow 3. Allergy to PCN, unkown if he has ever had cephalosporins, cant take cipro due to flecanide. 4. Will continue clinda / flagyl for the moment. 5. IVF: 100 cc/hr NS after MN 6. Norco if needed for pain 2. A.fib - 1. Hold Eliquis 2. Continue flecainide 3. Continue metoprolol  DVT prophylaxis: SCDs Code Status: Full Family Communication: Wife at bedside Disposition Plan: Home after admit Consults called: Gen surg Admission status: Place in 17   [place], [place] Hospitalists  How to contact the [facility] Attending or Consulting provider [place] or covering provider during after hours [place], for this patient?  1. Check the care team in [facility] and look for a) attending/consulting [facility] provider listed and b) the [facility] team listed 2. Log into [email or web address]  Amion Physician Scheduling and messaging for groups and whole hospitals  On call and physician scheduling software for group practices, residents, hospitalists and other medical providers for call, clinic, rotation and shift schedules. On[employer] is a hospital-wide system for scheduling doctors and paging doctors on call. EasyPlot is for scientific plotting and data analysis.  [email or web address]  and use [facility]'s universal password to access. If you do not have the password, please contact the hospital operator.  3. Locate the [facility] provider you are looking for under [facility] and page to a number that you can be directly reached. 4. If you still have difficulty reaching the provider, please page the [facility] (Director on Call) for the Hospitalists listed on amion for assistance.  03/23/2018, 8:05 PM

## 2018-03-23 NOTE — ED Notes (Signed)
RN informed Pt can receive visitor  

## 2018-03-23 NOTE — Consult Note (Signed)
Surgical Consultation Requesting provider: Dr. Lita Mains  CC: perirectal abscess  HPI: Very nice man who last took his Eliquis this morning, who presents with worsening perirectal abscess. He was seen in our office last week for the same and aspiration was attempted without much success. He was placed on antibiotics in hopes that this would subside, but unfortunately developed worsening pain, chills and fevers up to 101.5 over the weekend.  While in the ER this evening the abscess has spontaneously started to drain through a punctate opening. He has been admitted for IV clinda/ flagyl and we are asked to assess for I&D.   He had one prior perirectal abscess in the early 90s which required drainage, recurred and then self-drained, has had no further issues in the interim.   Allergies  Allergen Reactions  . Ciprofloxacin Other (See Comments)    Is currently taking Tambocor.Marland KitchenMarland KitchenINTERACTION??  . Penicillins Hives and Rash    Did it involve swelling of the face/tongue/throat, SOB, or low BP? Yes Did it involve sudden or severe rash/hives, skin peeling, or any reaction on the inside of your mouth or nose? Unk Did you need to seek medical attention at a hospital or doctor's office? Yes When did it last happen?1970's If all above answers are "NO", may proceed with cephalosporin use.     Past Medical History:  Diagnosis Date  . Allergy   . Anxiety   . Arthritis   . Atrial fibrillation ([place])   . Benign prostatic hypertrophy   . Family history of anesthesia complication    "MOTHER DEVELOPED DEMENTIA AFTER SURGERY"  . Gastric polyps - hyperplastic 02/20/2018  . GERD (gastroesophageal reflux disease)    INTERMITANT -NO MEDS  . Hx of adenomatous polyp of colon 05/23/2009  . Hypercholesterolemia   . Memory loss   . Nocturia     Past Surgical History:  Procedure Laterality Date  . APPENDECTOMY    . COLONOSCOPY W/ POLYPECTOMY    . COLOSTOMY    . PLATE NECK  4010   TITANIUM PLATE IN NECK  .  TRANSURETHRAL RESECTION OF PROSTATE N/A 08/06/2013   Procedure: TRANSURETHRAL RESECTION OF THE PROSTATE (TURP) WITH GYRUS, irrigation of bladder stone, cystoscope;  Surgeon: Ailene Rud, MD;  Location: [facility];  Service: Urology;  Laterality: N/A;    Family History  Problem Relation Age of Onset  . Hypertension Mother   . Hyperlipidemia Mother   . GER disease Mother   . Arrhythmia Father   . Stroke Father        17s  . Hypertension Sister   . GER disease Sister   . Other Sister        tumor removed from her colon, possible pre-cancerous, foot of her bowel removed  . Colon cancer Sister   . Esophageal cancer Other   . Stomach cancer Other   . Rectal cancer Other     Social History   Socioeconomic History  . Marital status: Married    Spouse name: Bethena Roys  . Number of children: 0  . Years of education: Not on file  . Highest education level: Not on file  Occupational History  . Occupation: psychologist  Social Needs  . Financial resource strain: Not on file  . Food insecurity:    Worry: Not on file    Inability: Not on file  . Transportation needs:    Medical: Not on file    Non-medical: Not on file  Tobacco Use  . Smoking status: Former Smoker  Packs/day: 1.00    Types: Cigarettes    Last attempt to quit: 01/21/1990    Years since quitting: 28.1  . Smokeless tobacco: Never Used  Substance and Sexual Activity  . Alcohol use: Yes    Alcohol/week: 8.0 standard drinks    Types: 8 Glasses of wine per week    Comment: 2 DRINKS 4-5 TIMES PER WK  . Drug use: No  . Sexual activity: Not on file  Lifestyle  . Physical activity:    Days per week: Not on file    Minutes per session: Not on file  . Stress: Not on file  Relationships  . Social connections:    Talks on phone: Not on file    Gets together: Not on file    Attends religious service: Not on file    Active member of club or organization: Not on file    Attends meetings of clubs or organizations: Not on  file    Relationship status: Not on file  Other Topics Concern  . Not on file  Social History Narrative   Step Son is mentally handicapped;   Mother has moved into the home    No current facility-administered medications on file prior to encounter.    Current Outpatient Medications on File Prior to Encounter  Medication Sig Dispense Refill  . acetaminophen (TYLENOL) 325 MG tablet Take 325 mg by mouth every 6 (six) hours as needed for fever (or for pain or headaches).    Marland Kitchen atorvastatin (LIPITOR) 10 MG tablet Take 1 tablet (10 mg total) by mouth daily. (Patient taking differently: Take 10 mg by mouth at bedtime. ) 90 tablet 3  . cetirizine (ZYRTEC) 10 MG tablet Take 5 mg by mouth daily as needed (for seasonal allergies).     . Coenzyme Q10 (CO Q 10 PO) Take 1 capsule by mouth at bedtime.     Marland Kitchen doxycycline (VIBRA-TABS) 100 MG tablet Take 100 mg by mouth 2 (two) times daily. FOR 7 DAYS    . ELIQUIS 5 MG TABS tablet TAKE 1 TABLET BY MOUTH TWICE A DAY (Patient taking differently: Take 5 mg by mouth 2 (two) times daily. ) 60 tablet 5  . flecainide (TAMBOCOR) 100 MG tablet TAKE ONE TAB BY MOUTH TWICE DAILY. TAKE 1/2 TAB BY MOUTH DAILY AS NEEDED FOR AFIB (Patient taking differently: Take 100 mg by mouth See admin instructions. Take 100 mg by mouth two times a day and an additional 50 mg once a day as needed for A-Fib (as directed)) 225 tablet 3  . fluticasone (FLONASE) 50 MCG/ACT nasal spray Place 1 spray into both nostrils daily as needed for allergies or rhinitis.     . Glucosamine HCl (GLUCOSAMINE PO) Take 1 tablet by mouth daily.    . metoprolol succinate (TOPROL-XL) 25 MG 24 hr tablet Take 1/2 tablet by mouth daily. (Patient taking differently: Take 12.5 mg by mouth at bedtime. ) 45 tablet 3  . metroNIDAZOLE (FLAGYL) 500 MG tablet Take 500 mg by mouth 3 (three) times daily. FOR 7 DAYS    . pantoprazole (PROTONIX) 40 MG tablet Take 1 tablet (40 mg total) by mouth daily before breakfast. 90 tablet  3  . Multiple Vitamin (MULTIVITAMIN) capsule Take 1 capsule by mouth daily.      Review of Systems: a complete, 10pt review of systems was completed with pertinent positives and negatives as documented in the HPI  Physical Exam: Vitals:   03/23/18 2000 03/23/18 2054  BP: 124/82 132/73  Pulse: 70 62  Resp: 16 18  Temp:  99.1 F (37.3 C)  SpO2: 97% 98%   Gen: A&Ox3, no distress  Head: normocephalic, atraumatic Eyes: extraocular motions intact, anicteric.  Neck: supple without mass or thyromegaly Chest: unlabored respirations, symmetrical air entry, clear bilaterally   Cardiovascular: RRR with palpable distal pulses, no pedal edema Abdomen: soft, nondistended, nontender. No mass or organomegaly.  Extremities: warm, without edema, no deformities  Neuro: grossly intact Psych: appropriate mood and affect, normal insight  Skin: warm and dry Rectal: the left lateral perianal region is edematous, tender and erythematous with purulent drainage expressed with pressure.    CBC Latest Ref Rng & Units 03/23/2018 08/02/2013 10/12/2009  WBC 4.0 - 10.5 K/uL 11.2(H) 9.7 11.2(H)  Hemoglobin 13.0 - 17.0 g/dL 14.2 14.9 16.0  Hematocrit 39.0 - 52.0 % 42.2 43.4 47.0  Platelets 150 - 400 K/uL 237 224 209    CMP Latest Ref Rng & Units 03/23/2018 08/02/2013 10/14/2009  Glucose 70 - 99 mg/dL 96 82 86  BUN 8 - 23 mg/dL 10 13 12   Creatinine 0.61 - 1.24 mg/dL 0.89 0.83 1.02  Sodium 135 - 145 mmol/L 135 141 137  Potassium 3.5 - 5.1 mmol/L 4.3 4.5 4.4  Chloride 98 - 111 mmol/L 103 104 103  CO2 22 - 32 mmol/L 22 27 29   Calcium 8.9 - 10.3 mg/dL 8.7(L) 9.5 8.7  Total Protein 6.5 - 8.1 g/dL 6.7 - -  Total Bilirubin 0.3 - 1.2 mg/dL 0.7 - -  Alkaline Phos 38 - 126 U/L 55 - -  AST 15 - 41 U/L 22 - -  ALT 0 - 44 U/L 17 - -    Lab Results  Component Value Date   INR 1.31 10/14/2009   INR 1.26 10/13/2009   INR 1.24 10/12/2009    Imaging: Ct Pelvis W Contrast  Result Date: 03/23/2018 CLINICAL DATA:   Perianal abscess.  Pain. EXAM: CT PELVIS WITH CONTRAST TECHNIQUE: Multidetector CT imaging of the pelvis was performed using the standard protocol following the bolus administration of intravenous contrast. CONTRAST:  140mL OMNIPAQUE IOHEXOL 300 MG/ML  SOLN COMPARISON:  None. FINDINGS: Urinary Tract: Distal ureters and urinary bladder are within normal limits. Bowel: Visualized small bowel is normal. Visualized ascending colon is normal. Appendix is not visualized and may be surgically absent. Descending and sigmoid colon are within normal limits. Rectum is unremarkable. A left perianal fluid collection measures 2.1 x 2.8 x 2.3 cm. There are some associated inflammatory changes. Vascular/Lymphatic: No pathologically enlarged lymph nodes. No significant vascular abnormality seen. Reproductive:  Prostate is enlarged, measuring 5.7 cm. Musculoskeletal: Osseous pelvis is within normal limits. Hips are located and within normal limits bilaterally. IMPRESSION: 1. Left perianal fluid collection/abscess measures 2.1 x 2.8 x 2.3 cm without further extension into the pelvis. 2. Enlarged prostate gland. Electronically Signed   By: San Morelle M.D.   On: 03/23/2018 18:17     A/P: 73yo man with 2.8x2.1x2.3cm perianal abscess, now starting to self drain -Continue IV abx, sitz baths -please hold anticoagulation and keep him NPO after midnight -Will reassess in the morning, likely would benefit from formal I&D    Romana Juniper, MD [facility], [place] Pager [phone]

## 2018-03-23 NOTE — ED Notes (Signed)
Phlebotomy at bedside.

## 2018-03-23 NOTE — ED Triage Notes (Signed)
Pt here with perianal pain due to an abscess there.  Pt see at surgical center and PA attempted to aspirate fluid but hesitant to lance due to pt being on blood thinners.  Pt woke up with increased pain and unable to get in a comfortable position.

## 2018-03-24 ENCOUNTER — Encounter: Payer: Self-pay | Admitting: Internal Medicine

## 2018-03-24 LAB — MRSA PCR SCREENING: MRSA by PCR: NEGATIVE

## 2018-03-24 LAB — HIV ANTIBODY (ROUTINE TESTING W REFLEX): HIV Screen 4th Generation wRfx: NONREACTIVE

## 2018-03-24 NOTE — Anesthesia Preprocedure Evaluation (Addendum)
Anesthesia Evaluation  Patient identified by MRN, date of birth, ID band Patient awake    Reviewed: Allergy & Precautions, NPO status , Patient's Chart, lab work & pertinent test results  Airway Mallampati: II  TM Distance: >3 FB     Dental   Pulmonary former smoker,    breath sounds clear to auscultation       Cardiovascular  Rhythm:Regular Rate:Normal     Neuro/Psych    GI/Hepatic Neg liver ROS, GERD  ,  Endo/Other  negative endocrine ROS  Renal/GU negative Renal ROS     Musculoskeletal   Abdominal   Peds  Hematology   Anesthesia Other Findings   Reproductive/Obstetrics                            Anesthesia Physical Anesthesia Plan  ASA: III  Anesthesia Plan: General   Post-op Pain Management:    Induction: Intravenous  PONV Risk Score and Plan: 2 and Ondansetron, Dexamethasone and Midazolam  Airway Management Planned: Oral ETT  Additional Equipment:   Intra-op Plan:   Post-operative Plan: Extubation in OR  Informed Consent: I have reviewed the patients History and Physical, chart, labs and discussed the procedure including the risks, benefits and alternatives for the proposed anesthesia with the patient or authorized representative who has indicated his/her understanding and acceptance.     Dental advisory given  Plan Discussed with: Anesthesiologist and CRNA  Anesthesia Plan Comments:        Anesthesia Quick Evaluation

## 2018-03-24 NOTE — Progress Notes (Signed)
Arrived to 6n27 from [facility] at this time

## 2018-03-24 NOTE — Progress Notes (Signed)
[facility]/Trauma Progress Note      Assessment/Plan Principal Problem:   Perianal abscess Active Problems:   ATRIAL FIBRILLATION   Family history of anesthesia complication  4.1P3.1x2.3cm perianal abscess - was seen in our office last week, s/p aspiration without much success but worsened over the weekend - started to self drain overnight and pain improved - Continue IV abx, sitz baths  FEN: reg diet, NPO at midnight VTE: SCD's, hold eliquis but okay for heparin ID: Clindamycin & Flagyl 03/02>>  Follow up: TBD  DISPO: will likely take to OR tomorrow for I&D    LOS: 0 days    Subjective: CC: rectal pain  Pt had a BM this am without too much pain. His pain is improved. He had this before and it required OR drainage. No fevers overnight.   Objective: Vital signs in last 24 hours: Temp:  [97.6 F (36.4 C)-99.1 F (37.3 C)] 97.9 F (36.6 C) (03/03 0510) Pulse Rate:  [54-94] 54 (03/03 0510) Resp:  [16-18] 18 (03/03 0510) BP: (103-136)/(67-88) 103/67 (03/03 0510) SpO2:  [93 %-100 %] 96 % (03/03 0510) Weight:  [88 kg] 88 kg (03/03 0157) Last BM Date: 03/23/18  Intake/Output from previous day: 03/02 0701 - 03/03 0700 In: 100 [IV Piggyback:100] Out: -  Intake/Output this shift: No intake/output data recorded.  PE: Gen:  Alert, NAD, pleasant, cooperative Pulm:  Rate and effort normal GU: chaperone present, left sided perirectal abscess with small opening and with brown purulent drainage, mild induration, mild TTP Skin: no rashes noted, warm and dry   Anti-infectives: Anti-infectives (From admission, onward)   Start     Dose/Rate Route Frequency Ordered Stop   03/24/18 0400  metroNIDAZOLE (FLAGYL) IVPB 500 mg     500 mg 100 mL/hr over 60 Minutes Intravenous Every 8 hours 03/23/18 1943     03/24/18 0200  clindamycin (CLEOCIN) IVPB 600 mg     600 mg 100 mL/hr over 30 Minutes Intravenous Every 6 hours 03/23/18 1956     03/23/18 1915  clindamycin  (CLEOCIN) IVPB 600 mg     600 mg 100 mL/hr over 30 Minutes Intravenous  Once 03/23/18 1907 03/23/18 2008   03/23/18 1915  metroNIDAZOLE (FLAGYL) IVPB 500 mg     500 mg 100 mL/hr over 60 Minutes Intravenous  Once 03/23/18 1907 03/23/18 2108      Lab Results:  Recent Labs    03/23/18 1635  WBC 11.2*  HGB 14.2  HCT 42.2  PLT 237   BMET Recent Labs    03/23/18 1635  NA 135  K 4.3  CL 103  CO2 22  GLUCOSE 96  BUN 10  CREATININE 0.89  CALCIUM 8.7*   PT/INR No results for input(s): LABPROT, INR in the last 72 hours. CMP     Component Value Date/Time   NA 135 03/23/2018 1635   K 4.3 03/23/2018 1635   CL 103 03/23/2018 1635   CO2 22 03/23/2018 1635   GLUCOSE 96 03/23/2018 1635   BUN 10 03/23/2018 1635   CREATININE 0.89 03/23/2018 1635   CALCIUM 8.7 (L) 03/23/2018 1635   PROT 6.7 03/23/2018 1635   ALBUMIN 3.3 (L) 03/23/2018 1635   AST 22 03/23/2018 1635   ALT 17 03/23/2018 1635   ALKPHOS 55 03/23/2018 1635   BILITOT 0.7 03/23/2018 1635   GFRNONAA >60 03/23/2018 1635   GFRAA >60 03/23/2018 1635   Lipase  No results found for: LIPASE  Studies/Results: Ct Pelvis W Contrast  Result  Date: 03/23/2018 CLINICAL DATA:  Perianal abscess.  Pain. EXAM: CT PELVIS WITH CONTRAST TECHNIQUE: Multidetector CT imaging of the pelvis was performed using the standard protocol following the bolus administration of intravenous contrast. CONTRAST:  174mL OMNIPAQUE IOHEXOL 300 MG/ML  SOLN COMPARISON:  None. FINDINGS: Urinary Tract: Distal ureters and urinary bladder are within normal limits. Bowel: Visualized small bowel is normal. Visualized ascending colon is normal. Appendix is not visualized and may be surgically absent. Descending and sigmoid colon are within normal limits. Rectum is unremarkable. A left perianal fluid collection measures 2.1 x 2.8 x 2.3 cm. There are some associated inflammatory changes. Vascular/Lymphatic: No pathologically enlarged lymph nodes. No significant vascular  abnormality seen. Reproductive:  Prostate is enlarged, measuring 5.7 cm. Musculoskeletal: Osseous pelvis is within normal limits. Hips are located and within normal limits bilaterally. IMPRESSION: 1. Left perianal fluid collection/abscess measures 2.1 x 2.8 x 2.3 cm without further extension into the pelvis. 2. Enlarged prostate gland. Electronically Signed   By: San Morelle M.D.   On: 03/23/2018 18:17      Kalman Drape , [facility] 03/24/2018, 10:37 AM  Pager: [phone] Mon-Wed, Friday 7:00am-4:30pm Thurs 7am-11:30am  Consults: [phone]

## 2018-03-24 NOTE — Progress Notes (Signed)
Progress Note    Joel [name]  ERX:[id number] DOB: 1945/09/09  DOA: 03/23/2018 PCP: Crist Infante, MD    Brief Narrative:     Medical records reviewed and are as summarized below:  Joel [name] is an 73 y.o. male with medical history significant of recurrent perirectal abscesses and atrial fibrillation on Eliquis. States he developed swelling and pain in the rectal region 4 days ago. Was evaluated by surgery in the office 3 days ago. Had attempted aspiration of the abscess which was unsuccessful.  They didn't want to attempt I+D in office due to him being on eliquis.  Put on Flagyl and doxycycline at that time. States he has had persistent, worsening perirectal pain. He has had fevers up to 101.5,chills and diaphoresis.   Assessment/Plan:   Principal Problem:   Perianal abscess Active Problems:   ATRIAL FIBRILLATION   Family history of anesthesia complication  Perianal abscess - -NPO after MN -Plan for surgical I+D tomorrow -continue clinda / flagyl IV -failed outpatient abx  Paroxysmal A.fib - ([place] VASC2: 1) -Hold Eliquis -Continue flecainide -Continue metoprolol-- may need holding parameters    Family Communication/Anticipated D/C date and plan/Code Status   DVT prophylaxis: eliquis (on own) Code Status: Full Code.  Family Communication:  Disposition Plan:    Medical Consultants:    General surgery  Subjective:   Pain improved but still draining and per surgery still needs I/D  Objective:    Vitals:   03/23/18 2054 03/23/18 2357 03/24/18 0157 03/24/18 0510  BP: 132/73 119/77  103/67  Pulse: 62 62  (!) 54  Resp: 18 18  18   Temp: 99.1 F (37.3 C) 97.6 F (36.4 C)  97.9 F (36.6 C)  TempSrc: Oral Oral  Oral  SpO2: 98% 96%  96%  Weight:   88 kg   Height:   6\' 4"  (1.93 m)     Intake/Output Summary (Last 24 hours) at 03/24/2018 1153 Last data filed at 03/23/2018 2008 Gross per 24 hour  Intake 100 ml  Output -  Net 100 ml    Filed Weights   03/24/18 0157  Weight: 88 kg    Exam: In bed, NAD rrr Clear, no wheezing +BS, soft, NT No LE edema  Data Reviewed:   I have personally reviewed following labs and imaging studies:  Labs: Labs show the following:   Basic Metabolic Panel: Recent Labs  Lab 03/23/18 1635  NA 135  K 4.3  CL 103  CO2 22  GLUCOSE 96  BUN 10  CREATININE 0.89  CALCIUM 8.7*   GFR Estimated Creatinine Clearance: 94.8 mL/min (by C-G formula based on SCr of 0.89 mg/dL). Liver Function Tests: Recent Labs  Lab 03/23/18 1635  AST 22  ALT 17  ALKPHOS 55  BILITOT 0.7  PROT 6.7  ALBUMIN 3.3*   No results for input(s): LIPASE, AMYLASE in the last 168 hours. No results for input(s): AMMONIA in the last 168 hours. Coagulation profile No results for input(s): INR, PROTIME in the last 168 hours.  CBC: Recent Labs  Lab 03/23/18 1635  WBC 11.2*  NEUTROABS 6.9  HGB 14.2  HCT 42.2  MCV 95.7  PLT 237   Cardiac Enzymes: No results for input(s): CKTOTAL, CKMB, CKMBINDEX, TROPONINI in the last 168 hours. BNP (last 3 results) No results for input(s): PROBNP in the last 8760 hours. CBG: No results for input(s): GLUCAP in the last 168 hours. D-Dimer: No results for input(s): DDIMER in the last 72 hours.  Hgb A1c: No results for input(s): HGBA1C in the last 72 hours. Lipid Profile: No results for input(s): CHOL, HDL, LDLCALC, TRIG, CHOLHDL, LDLDIRECT in the last 72 hours. Thyroid function studies: No results for input(s): TSH, T4TOTAL, T3FREE, THYROIDAB in the last 72 hours.  Invalid input(s): FREET3 Anemia work up: No results for input(s): VITAMINB12, FOLATE, FERRITIN, TIBC, IRON, RETICCTPCT in the last 72 hours. Sepsis Labs: Recent Labs  Lab 03/23/18 1635  WBC 11.2*    Microbiology Recent Results (from the past 240 hour(s))  MRSA PCR Screening     Status: None   Collection Time: 03/24/18  4:12 AM  Result Value Ref Range Status   MRSA by PCR NEGATIVE NEGATIVE  Final    Comment:        The GeneXpert MRSA Assay (FDA approved for NASAL specimens only), is one component of a comprehensive MRSA colonization surveillance program. It is not intended to diagnose MRSA infection nor to guide or monitor treatment for MRSA infections. Performed at [facility], [place] [address], [place], [place] [address]     Procedures and diagnostic studies:  Ct Pelvis W Contrast  Result Date: 03/23/2018 CLINICAL DATA:  Perianal abscess.  Pain. EXAM: CT PELVIS WITH CONTRAST TECHNIQUE: Multidetector CT imaging of the pelvis was performed using the standard protocol following the bolus administration of intravenous contrast. CONTRAST:  134mL OMNIPAQUE IOHEXOL 300 MG/ML  SOLN COMPARISON:  None. FINDINGS: Urinary Tract: Distal ureters and urinary bladder are within normal limits. Bowel: Visualized small bowel is normal. Visualized ascending colon is normal. Appendix is not visualized and may be surgically absent. Descending and sigmoid colon are within normal limits. Rectum is unremarkable. A left perianal fluid collection measures 2.1 x 2.8 x 2.3 cm. There are some associated inflammatory changes. Vascular/Lymphatic: No pathologically enlarged lymph nodes. No significant vascular abnormality seen. Reproductive:  Prostate is enlarged, measuring 5.7 cm. Musculoskeletal: Osseous pelvis is within normal limits. Hips are located and within normal limits bilaterally. IMPRESSION: 1. Left perianal fluid collection/abscess measures 2.1 x 2.8 x 2.3 cm without further extension into the pelvis. 2. Enlarged prostate gland. Electronically Signed   By: San Morelle M.D.   On: 03/23/2018 18:17    Medications:   . atorvastatin  10 mg Oral QHS  . flecainide  100 mg Oral BID  . metoprolol succinate  12.5 mg Oral QHS  . multivitamin with minerals  1 tablet Oral Daily  . pantoprazole  40 mg Oral QAC breakfast   Continuous Infusions: . sodium chloride 100 mL/hr at 03/24/18 0128    . clindamycin (CLEOCIN) IV Stopped (03/24/18 0231)  . metronidazole 500 mg (03/24/18 0412)     LOS: 0 days   Geradine Girt  [facility]   How to contact the [facility] Attending or Consulting provider [place] or covering provider during after hours [place], for this patient?  1. Check the care team in [facility] and look for a) attending/consulting [facility] provider listed and b) the [facility] team listed 2. Log into [email or web address] and use [place]'s universal password to access. If you do not have the password, please contact the hospital operator. 3. Locate the [facility] provider you are looking for under [facility] and page to a number that you can be directly reached. 4. If you still have difficulty reaching the provider, please page the [facility] (Director on Call) for the Hospitalists listed on amion for assistance.  03/24/2018, 11:53 AM

## 2018-03-25 ENCOUNTER — Encounter (HOSPITAL_COMMUNITY)
Admission: EM | Disposition: A | Discharge status: Home / Self Care | Payer: Self-pay | Source: Home / Self Care | Attending: Internal Medicine

## 2018-03-25 ENCOUNTER — Inpatient Hospital Stay (HOSPITAL_COMMUNITY): Payer: Medicare HMO | Admitting: Registered Nurse

## 2018-03-25 DIAGNOSIS — Z8489 Family history of other specified conditions: Secondary | ICD-10-CM

## 2018-03-25 HISTORY — PX: INCISION AND DRAINAGE ABSCESS: SHX5864

## 2018-03-25 LAB — BASIC METABOLIC PANEL
Anion gap: 9 (ref 5–15)
BUN: 13 mg/dL (ref 8–23)
CO2: 21 mmol/L — ABNORMAL LOW (ref 22–32)
Calcium: 8.7 mg/dL — ABNORMAL LOW (ref 8.9–10.3)
Chloride: 106 mmol/L (ref 98–111)
Creatinine, Ser: 0.83 mg/dL (ref 0.61–1.24)
GFR calc non Af Amer: >60 mL/min (ref >60)
Glucose, Bld: 92 mg/dL (ref 70–99)
Potassium: 4.2 mmol/L (ref 3.5–5.1)
Sodium: 136 mmol/L (ref 135–145)

## 2018-03-25 LAB — CBC
HCT: 38.4 % — ABNORMAL LOW (ref 39.0–52.0)
Hemoglobin: 13.2 g/dL (ref 13.0–17.0)
MCH: 33 pg (ref 26.0–34.0)
MCHC: 34.4 g/dL (ref 30.0–36.0)
MCV: 96 fL (ref 80.0–100.0)
Platelets: 244 10*3/uL (ref 150–400)
RBC: 4 MIL/uL — ABNORMAL LOW (ref 4.22–5.81)
RDW: 12 % (ref 11.5–15.5)
WBC: 8.9 10*3/uL (ref 4.0–10.5)
nRBC: 0 % (ref 0.0–0.2)

## 2018-03-25 SURGERY — INCISION AND DRAINAGE, ABSCESS
Anesthesia: General | Site: Rectum

## 2018-03-25 MED ORDER — MENTHOL 3 MG MT LOZG
1.0000 | LOZENGE | OROMUCOSAL | Status: DC | PRN
  Administered 2018-03-25: 3 mg via ORAL
  Filled 2018-03-25: qty 9

## 2018-03-25 MED ORDER — LACTATED RINGERS IV SOLN
INTRAVENOUS | Status: DC | PRN
  Administered 2018-03-25: 08:00:00 via INTRAVENOUS

## 2018-03-25 MED ORDER — DEXAMETHASONE SODIUM PHOSPHATE 10 MG/ML IJ SOLN
INTRAMUSCULAR | Status: DC | PRN
  Administered 2018-03-25: 5 mg via INTRAVENOUS

## 2018-03-25 MED ORDER — DEXAMETHASONE SODIUM PHOSPHATE 10 MG/ML IJ SOLN
INTRAMUSCULAR | Status: AC
  Filled 2018-03-25: qty 1

## 2018-03-25 MED ORDER — FENTANYL CITRATE (PF) 100 MCG/2ML IJ SOLN
25.0000 ug | INTRAMUSCULAR | Status: DC | PRN
  Administered 2018-03-25: 25 ug via INTRAVENOUS

## 2018-03-25 MED ORDER — 0.9 % SODIUM CHLORIDE (POUR BTL) OPTIME
TOPICAL | Status: DC | PRN
  Administered 2018-03-25: 1000 mL

## 2018-03-25 MED ORDER — FENTANYL CITRATE (PF) 250 MCG/5ML IJ SOLN
INTRAMUSCULAR | Status: AC
  Filled 2018-03-25: qty 5

## 2018-03-25 MED ORDER — ONDANSETRON HCL 4 MG/2ML IJ SOLN
INTRAMUSCULAR | Status: AC
  Filled 2018-03-25: qty 2

## 2018-03-25 MED ORDER — BUPIVACAINE HCL (PF) 0.25 % IJ SOLN
INTRAMUSCULAR | Status: AC
  Filled 2018-03-25: qty 30

## 2018-03-25 MED ORDER — FENTANYL CITRATE (PF) 100 MCG/2ML IJ SOLN: 25.0000 ug | INTRAMUSCULAR | Status: DC | PRN

## 2018-03-25 MED ORDER — PHENOL 1.4 % MT LIQD
1.0000 | OROMUCOSAL | Status: DC | PRN
  Administered 2018-03-25: 1 via OROMUCOSAL
  Filled 2018-03-25: qty 177

## 2018-03-25 MED ORDER — PROPOFOL 10 MG/ML IV BOLUS
INTRAVENOUS | Status: DC | PRN
  Administered 2018-03-25: 180 mg via INTRAVENOUS

## 2018-03-25 MED ORDER — FENTANYL CITRATE (PF) 100 MCG/2ML IJ SOLN
INTRAMUSCULAR | Status: AC
  Filled 2018-03-25: qty 2

## 2018-03-25 MED ORDER — LIDOCAINE 2% (20 MG/ML) 5 ML SYRINGE
INTRAMUSCULAR | Status: AC
  Filled 2018-03-25: qty 5

## 2018-03-25 MED ORDER — BUPIVACAINE HCL 0.25 % IJ SOLN
INTRAMUSCULAR | Status: DC | PRN
  Administered 2018-03-25: 10 mL

## 2018-03-25 MED ORDER — PROPOFOL 10 MG/ML IV BOLUS
INTRAVENOUS | Status: AC
  Filled 2018-03-25: qty 20

## 2018-03-25 MED ORDER — HEMOSTATIC AGENTS (NO CHARGE) OPTIME
TOPICAL | Status: DC | PRN
  Administered 2018-03-25: 1 via TOPICAL

## 2018-03-25 MED ORDER — FENTANYL CITRATE (PF) 100 MCG/2ML IJ SOLN
INTRAMUSCULAR | Status: DC | PRN
  Administered 2018-03-25: 100 ug via INTRAVENOUS

## 2018-03-25 MED ORDER — LIDOCAINE 2% (20 MG/ML) 5 ML SYRINGE
INTRAMUSCULAR | Status: DC | PRN
  Administered 2018-03-25: 60 mg via INTRAVENOUS

## 2018-03-25 MED ORDER — ONDANSETRON HCL 4 MG/2ML IJ SOLN
INTRAMUSCULAR | Status: DC | PRN
  Administered 2018-03-25: 4 mg via INTRAVENOUS

## 2018-03-25 SURGICAL SUPPLY — 39 items
BLADE CLIPPER SURG (BLADE) IMPLANT
BNDG GAUZE ELAST 4 BULKY (GAUZE/BANDAGES/DRESSINGS) IMPLANT
BRIEF STRETCH FOR OB PAD LRG (UNDERPADS AND DIAPERS) ×1 IMPLANT
CANISTER SUCT 3000ML PPV (MISCELLANEOUS) ×2 IMPLANT
CHLORAPREP W/TINT 26ML (MISCELLANEOUS) IMPLANT
CONT SPEC 4OZ CLIKSEAL STRL BL (MISCELLANEOUS) ×1 IMPLANT
COVER SURGICAL LIGHT HANDLE (MISCELLANEOUS) ×2 IMPLANT
COVER WAND RF STERILE (DRAPES) ×2 IMPLANT
DECANTER SPIKE VIAL GLASS SM (MISCELLANEOUS) ×1 IMPLANT
DRAIN PENROSE 1/4X12 LTX STRL (WOUND CARE) ×1 IMPLANT
DRAPE LAPAROSCOPIC ABDOMINAL (DRAPES) IMPLANT
DRAPE LAPAROTOMY 100X72 PEDS (DRAPES) IMPLANT
DRSG PAD ABDOMINAL 8X10 ST (GAUZE/BANDAGES/DRESSINGS) ×1 IMPLANT
ELECT CAUTERY BLADE 6.4 (BLADE) ×1 IMPLANT
ELECT REM PT RETURN 9FT ADLT (ELECTROSURGICAL) ×2
ELECTRODE REM PT RTRN 9FT ADLT (ELECTROSURGICAL) ×1 IMPLANT
GAUZE SPONGE 4X4 12PLY STRL (GAUZE/BANDAGES/DRESSINGS) ×1 IMPLANT
GLOVE BIO SURGEON STRL SZ7 (GLOVE) ×3 IMPLANT
GLOVE BIOGEL PI IND STRL 7.5 (GLOVE) ×1 IMPLANT
GLOVE BIOGEL PI INDICATOR 7.5 (GLOVE) ×1
GOWN STRL REUS W/ TWL LRG LVL3 (GOWN DISPOSABLE) ×2 IMPLANT
GOWN STRL REUS W/TWL LRG LVL3 (GOWN DISPOSABLE) ×4
HEMOSTAT SURGICEL 2X14 (HEMOSTASIS) ×1 IMPLANT
KIT BASIN OR (CUSTOM PROCEDURE TRAY) ×2 IMPLANT
KIT TURNOVER KIT B (KITS) ×2 IMPLANT
NDL HYPO 25GX1X1/2 BEV (NEEDLE) IMPLANT
NEEDLE HYPO 25GX1X1/2 BEV (NEEDLE) ×2 IMPLANT
NS IRRIG 1000ML POUR BTL (IV SOLUTION) ×2 IMPLANT
PACK GENERAL/GYN (CUSTOM PROCEDURE TRAY) ×1 IMPLANT
PACK LITHOTOMY IV (CUSTOM PROCEDURE TRAY) ×1 IMPLANT
PAD ARMBOARD 7.5X6 YLW CONV (MISCELLANEOUS) ×2 IMPLANT
PENCIL SMOKE EVACUATOR (MISCELLANEOUS) ×2 IMPLANT
SUT ETHILON 2 0 FS 18 (SUTURE) ×1 IMPLANT
SWAB COLLECTION DEVICE MRSA (MISCELLANEOUS) IMPLANT
SWAB CULTURE ESWAB REG 1ML (MISCELLANEOUS) IMPLANT
SYR BULB IRRIGATION 50ML (SYRINGE) ×1 IMPLANT
SYR CONTROL 10ML LL (SYRINGE) ×1 IMPLANT
TOWEL OR 17X24 6PK STRL BLUE (TOWEL DISPOSABLE) ×1 IMPLANT
TOWEL OR 17X26 10 PK STRL BLUE (TOWEL DISPOSABLE) ×2 IMPLANT

## 2018-03-25 NOTE — Progress Notes (Signed)
Pt received back from PACU and voided in the BR 200 cc, instructed on IS use.

## 2018-03-25 NOTE — Discharge Instructions (Signed)
How to Take a Sitz Bath  A sitz bath is a warm water bath that may be used to care for your rectum, genital area, or the area between your rectum and genitals (perineum). For a sitz bath, the water only comes up to your hips and covers your buttocks. A sitz bath may done at home in a bathtub or with a portable sitz bath that fits over the toilet.  Your health care provider may recommend a sitz bath to help:   Relieve pain and discomfort after delivering a baby.   Relieve pain and itching from hemorrhoids or anal fissures.   Relieve pain after certain surgeries.   Relax muscles that are sore or tight.  How to take a sitz bath  Take 3-4 sitz baths a day, or as many as told by your health care provider.  Bathtub sitz bath  To take a sitz bath in a bathtub:  1. Partially fill a bathtub with warm water. The water should be deep enough to cover your hips and buttocks when you are sitting in the tub.  2. If your health care provider told you to put medicine in the water, follow his or her instructions.  3. Sit in the water.  4. Open the tub drain a little, and leave it open during your bath.  5. Turn on the warm water again, enough to replace the water that is draining out. Keep the water running throughout your bath. This helps keep the water at the right level and the right temperature.  6. Soak in the water for 15-20 minutes, or as long as told by your health care provider.  7. When you are done, be careful when you stand up. You may feel dizzy.  8. After the sitz bath, pat yourself dry. Do not rub your skin to dry it.    Over-the-toilet sitz bath  To take a sitz bath with an over-the-toilet basin:  1. Follow the manufacturer's instructions.  2. Fill the basin with warm water.  3. If your health care provider told you to put medicine in the water, follow his or her instructions.  4. Sit on the seat. Make sure the water covers your buttocks and perineum.  5. Soak in the water for 15-20 minutes, or as long as told by  your health care provider.  6. After the sitz bath, pat yourself dry. Do not rub your skin to dry it.  7. Clean and dry the basin between uses.  8. Discard the basin if it cracks, or according to the manufacturer's instructions.  Contact a health care provider if:   Your symptoms get worse. Do not continue with sitz baths if your symptoms get worse.   You have new symptoms. If this happens, do not continue with sitz baths until you talk with your health care provider.  Summary   A sitz bath is a warm water bath in which the water only comes up to your hips and covers your buttocks.   A sitz bath may help relieve itching, relieve pain, and relax muscles that are sore or tight in the lower part of your body, including your genital area.   Take 3-4 sitz baths a day, or as many as told by your health care provider. Soak in the water for 15-20 minutes.   Do not continue with sitz baths if your symptoms get worse.  This information is not intended to replace advice given to you by your health care provider. Make 

## 2018-03-25 NOTE — Op Note (Signed)
Preop diagnosis: Recurrent left perianal abscess Postop diagnosis: Same Procedure performed: Incision and drainage of left perianal abscess Surgeon:Charna Neeb K Jarone Ostergaard Anesthesia: General Indications: This is a 73 year old male who presents with several days of worsening left perianal pain and swelling.  He is anticoagulated on Eliquis.  Attempts at conservative management were unsuccessful.  He presented to the emergency department 2 days ago.  The abscess spontaneously burst through a small punctate opening.  The Eliquis has been held.  He presents now for formal incision and drainage.  CT scan shows approximately 3 cm abscess.  Description of procedure: The patient is brought to the operating room and placed in the supine position on the operating room table.  After an adequate level of general anesthesia was obtained, he was placed in the lithotomy position in yellowfin stirrups.  His perineum was prepped with Betadine and draped in sterile fashion.  A timeout was taken to ensure the proper patient and proper procedure.  The area around the abscess was infiltrated with 0.25% Marcaine.  I made a 1.5 cm round incision in the center of the abscess.  We encountered a small pocket of purulent fluid.  The abscess cavity tunnels about 3 cm superiorly.  There is no tunneling into the rectum.  There is no tunneling into the ischio rectal space.  This all seems fairly superficial.  We irrigated the wound thoroughly.  A 4 cm piece of Penrose drain was inserted into the abscess cavity and secured with 2-0 nylon sutures.  The remainder of the wound was packed with Surgicel.  A dry dressing was applied.  The patient was then extubated and brought to the recovery room in stable condition.  All sponge, instrument, and needle counts are correct.  Imogene Burn. Georgette Dover, MD, [facility] Surgery  General/ Trauma Surgery Beeper [phone]  03/25/2018 9:08 AM

## 2018-03-25 NOTE — Discharge Summary (Signed)
Physician Discharge Summary  Joel [name] UQJ:[id number] DOB: [birth year] DOA: 03/23/2018  PCP: Crist Infante, MD  Admit date: 03/23/2018 Discharge date: 03/25/2018  Admitted From: Inpatient Disposition: home  Recommendations for Outpatient Follow-up:  1. Follow up with PCP in 1-2 weeks 2. Please follow up on the following pending results: none  Home Health:No Equipment/Devices:none  Discharge Condition:Good CODE STATUS:Full code Diet recommendation: Regular healthy diet  Brief/Interim Summary: Per admitting provider: [name] is a 73 y.o. male with medical history significant of recurrent perirectal abscesses and atrial fibrillation on Eliquis. States he developed swelling and pain in the rectal region 4 days ago. Was evaluated by surgery in the office 3 days ago. Had attempted aspiration of the abscess which was unsuccessful.  They didn't want to attempt I+D in office due to him being on eliquis.  Put on Flagyl and doxycycline at that time. States he has had persistent, worsening perirectal pain. He has had fevers up to 101.5,chills and diaphoresis. Called his surgeon today and was instructed to come to the emergency department for CT scan. Patient did take his Eliquis this morning. He has had no perirectal drainage or bleeding.  ED Course: CT reveals peri-anal abscess.  Hospital course: Patient was seen by general surgery in consultation.  As a further evaluation review the CT patient was taken to the operating room underwent incision and debridement under general anesthesia.  Discussed the case with general surgery postoperatively recommended no antibiotics and just sits baths and follow-up with him as an outpatient.  Patient did well postoperatively had no complications should be stable for discharge home today.  While in the hospital we will continue patient's nodal blocking agents for his atrial fibrillation.  His Eliquis is held but he can restart that this  evening per general surgery.  He will use acetaminophen for postoperative pain.  Patient tolerated diet no further complications discharge home today.  Discharge Diagnoses:  Principal Problem:   Perianal abscess Active Problems:   ATRIAL FIBRILLATION   Family history of anesthesia complication    Discharge Instructions  Discharge Instructions    Call MD for:   Complete by:  As directed    Any acute change in medical condition   Diet - low sodium heart healthy   Complete by:  As directed    Increase activity slowly   Complete by:  As directed      Allergies as of 03/25/2018      Reactions   Ciprofloxacin Other (See Comments)   Is currently taking Tambocor.Marland KitchenMarland KitchenINTERACTION??   Penicillins Hives, Rash   Did it involve swelling of the face/tongue/throat, SOB, or low BP? Yes Did it involve sudden or severe rash/hives, skin peeling, or any reaction on the inside of your mouth or nose? Unk Did you need to seek medical attention at a hospital or doctor's office? Yes When did it last happen?1970's If all above answers are "NO", may proceed with cephalosporin use.      Medication List    STOP taking these medications   doxycycline 100 MG tablet Commonly known as:  VIBRA-TABS   metroNIDAZOLE 500 MG tablet Commonly known as:  FLAGYL     TAKE these medications   acetaminophen 325 MG tablet Commonly known as:  TYLENOL Take 325 mg by mouth every 6 (six) hours as needed for fever (or for pain or headaches).   atorvastatin 10 MG tablet Commonly known as:  LIPITOR Take 1 tablet (10 mg total) by mouth daily. What changed:  when to take this   cetirizine 10 MG tablet Commonly known as:  ZYRTEC Take 5 mg by mouth daily as needed (for seasonal allergies).   CO Q 10 PO Take 1 capsule by mouth at bedtime.   ELIQUIS 5 MG Tabs tablet Generic drug:  apixaban TAKE 1 TABLET BY MOUTH TWICE A DAY What changed:  how much to take   flecainide 100 MG tablet Commonly known as:   TAMBOCOR TAKE ONE TAB BY MOUTH TWICE DAILY. TAKE 1/2 TAB BY MOUTH DAILY AS NEEDED FOR AFIB What changed:    how much to take  how to take this  when to take this  additional instructions   fluticasone 50 MCG/ACT nasal spray Commonly known as:  FLONASE Place 1 spray into both nostrils daily as needed for allergies or rhinitis.   GLUCOSAMINE PO Take 1 tablet by mouth daily.   metoprolol succinate 25 MG 24 hr tablet Commonly known as:  TOPROL-XL Take 1/2 tablet by mouth daily. What changed:    how much to take  how to take this  when to take this  additional instructions   multivitamin capsule Take 1 capsule by mouth daily.   pantoprazole 40 MG tablet Commonly known as:  PROTONIX Take 1 tablet (40 mg total) by mouth daily before breakfast.      Follow-up Information    Donnie Mesa, MD Follow up on 04/06/2018.   Specialty:  General Surgery Why:  You have an appointment at 8: 50AM.  Be at the office 30 minutes early for check in.  Bring photo ID and insurance information.   Contact information: [address] [place] [address] [phone]          Allergies  Allergen Reactions  . Ciprofloxacin Other (See Comments)    Is currently taking Tambocor.Marland KitchenMarland KitchenINTERACTION??  . Penicillins Hives and Rash    Did it involve swelling of the face/tongue/throat, SOB, or low BP? Yes Did it involve sudden or severe rash/hives, skin peeling, or any reaction on the inside of your mouth or nose? Unk Did you need to seek medical attention at a hospital or doctor's office? Yes When did it last happen?1970's If all above answers are "NO", may proceed with cephalosporin use.     Consultations:  gen surg   Procedures/Studies: Ct Pelvis W Contrast  Result Date: 03/23/2018 CLINICAL DATA:  Perianal abscess.  Pain. EXAM: CT PELVIS WITH CONTRAST TECHNIQUE: Multidetector CT imaging of the pelvis was performed using the standard protocol following the bolus  administration of intravenous contrast. CONTRAST:  112mL OMNIPAQUE IOHEXOL 300 MG/ML  SOLN COMPARISON:  None. FINDINGS: Urinary Tract: Distal ureters and urinary bladder are within normal limits. Bowel: Visualized small bowel is normal. Visualized ascending colon is normal. Appendix is not visualized and may be surgically absent. Descending and sigmoid colon are within normal limits. Rectum is unremarkable. A left perianal fluid collection measures 2.1 x 2.8 x 2.3 cm. There are some associated inflammatory changes. Vascular/Lymphatic: No pathologically enlarged lymph nodes. No significant vascular abnormality seen. Reproductive:  Prostate is enlarged, measuring 5.7 cm. Musculoskeletal: Osseous pelvis is within normal limits. Hips are located and within normal limits bilaterally. IMPRESSION: 1. Left perianal fluid collection/abscess measures 2.1 x 2.8 x 2.3 cm without further extension into the pelvis. 2. Enlarged prostate gland. Electronically Signed   By: San Morelle M.D.   On: 03/23/2018 18:17       Subjective:   Discharge Exam: Vitals:   03/25/18 1019 03/25/18  1426  BP: 133/77 127/80  Pulse: (!) 55 65  Resp: 15 16  Temp: 97.9 F (36.6 C) 98.2 F (36.8 C)  SpO2: 98% 96%   Vitals:   03/25/18 0943 03/25/18 0958 03/25/18 1019 03/25/18 1426  BP: 124/84 133/86 133/77 127/80  Pulse: (!) 55 60 (!) 55 65  Resp: 13 13 15 16   Temp:  98.1 F (36.7 C) 97.9 F (36.6 C) 98.2 F (36.8 C)  TempSrc:    Oral  SpO2: 97% 97% 98% 96%  Weight:      Height:        General: Pt is alert, awake, not in acute distress Cardiovascular: RRR, S1/S2 +, no rubs, no gallops Respiratory: CTA bilaterally, no wheezing, no rhonchi Abdominal: Soft, NT, ND, bowel sounds + Extremities: no edema, no cyanosis    The results of significant diagnostics from this hospitalization (including imaging, microbiology, ancillary and laboratory) are listed below for reference.     Microbiology: Recent Results  (from the past 240 hour(s))  MRSA PCR Screening     Status: None   Collection Time: 03/24/18  4:12 AM  Result Value Ref Range Status   MRSA by PCR NEGATIVE NEGATIVE Final    Comment:        The GeneXpert MRSA Assay (FDA approved for NASAL specimens only), is one component of a comprehensive MRSA colonization surveillance program. It is not intended to diagnose MRSA infection nor to guide or monitor treatment for MRSA infections. Performed at [facility], [place] [address], [place], [place] [address]      Labs: BNP (last 3 results) No results for input(s): BNP in the last 8760 hours. Basic Metabolic Panel: Recent Labs  Lab 03/23/18 1635 03/25/18 0154  NA 135 136  K 4.3 4.2  CL 103 106  CO2 22 21*  GLUCOSE 96 92  BUN 10 13  CREATININE 0.89 0.83  CALCIUM 8.7* 8.7*   Liver Function Tests: Recent Labs  Lab 03/23/18 1635  AST 22  ALT 17  ALKPHOS 55  BILITOT 0.7  PROT 6.7  ALBUMIN 3.3*   No results for input(s): LIPASE, AMYLASE in the last 168 hours. No results for input(s): AMMONIA in the last 168 hours. CBC: Recent Labs  Lab 03/23/18 1635 03/25/18 0154  WBC 11.2* 8.9  NEUTROABS 6.9  --   HGB 14.2 13.2  HCT 42.2 38.4*  MCV 95.7 96.0  PLT 237 244   Cardiac Enzymes: No results for input(s): CKTOTAL, CKMB, CKMBINDEX, TROPONINI in the last 168 hours. BNP: Invalid input(s): POCBNP CBG: No results for input(s): GLUCAP in the last 168 hours. D-Dimer No results for input(s): DDIMER in the last 72 hours. Hgb A1c No results for input(s): HGBA1C in the last 72 hours. Lipid Profile No results for input(s): CHOL, HDL, LDLCALC, TRIG, CHOLHDL, LDLDIRECT in the last 72 hours. Thyroid function studies No results for input(s): TSH, T4TOTAL, T3FREE, THYROIDAB in the last 72 hours.  Invalid input(s): FREET3 Anemia work up No results for input(s): VITAMINB12, FOLATE, FERRITIN, TIBC, IRON, RETICCTPCT in the last 72 hours. Urinalysis    Component Value  Date/Time   COLORURINE YELLOW 10/12/2009 1927   APPEARANCEUR CLEAR 10/12/2009 1927   LABSPEC 1.020 10/12/2009 1927   PHURINE 6.0 10/12/2009 1927   GLUCOSEU NEGATIVE 10/12/2009 1927   HGBUR NEGATIVE 10/12/2009 1927   BILIRUBINUR NEGATIVE 10/12/2009 [place] NEGATIVE 10/12/2009 1927   PROTEINUR NEGATIVE 10/12/2009 1927   UROBILINOGEN 0.2 10/12/2009 1927   NITRITE NEGATIVE 10/12/2009  1927   LEUKOCYTESUR  10/12/2009 1927    NEGATIVE MICROSCOPIC NOT DONE ON URINES WITH NEGATIVE PROTEIN, BLOOD, LEUKOCYTES, NITRITE, OR GLUCOSE <1000 mg/dL.   Sepsis Labs Invalid input(s): PROCALCITONIN,  WBC,  LACTICIDVEN Microbiology Recent Results (from the past 240 hour(s))  MRSA PCR Screening     Status: None   Collection Time: 03/24/18  4:12 AM  Result Value Ref Range Status   MRSA by PCR NEGATIVE NEGATIVE Final    Comment:        The GeneXpert MRSA Assay (FDA approved for NASAL specimens only), is one component of a comprehensive MRSA colonization surveillance program. It is not intended to diagnose MRSA infection nor to guide or monitor treatment for MRSA infections. Performed at [facility], [place] [address] [place], [place] [address]      Time coordinating discharge: Over 30 minutes  SIGNED:   Nicolette Bang, MD  [facility] 03/25/2018, 2:37 PM Pager   If 7PM-7AM, please contact night-coverage [email or web address] Password [email or web address]

## 2018-03-25 NOTE — Anesthesia Postprocedure Evaluation (Signed)
Anesthesia Post Note  Patient: Joel [name]  Procedure(s) Performed: INCISION AND DRAINAGE PERI-RECTAL ABSCESS (N/A Rectum)     Patient location during evaluation: PACU Anesthesia Type: General Level of consciousness: awake Pain management: pain level controlled Vital Signs Assessment: post-procedure vital signs reviewed and stable Respiratory status: spontaneous breathing Cardiovascular status: stable Postop Assessment: no apparent nausea or vomiting Anesthetic complications: no    Last Vitals:  Vitals:   03/25/18 0958 03/25/18 1019  BP: 133/86 133/77  Pulse: 60 (!) 55  Resp: 13 15  Temp: 36.7 C 36.6 C  SpO2: 97% 98%    Last Pain:  Vitals:   03/25/18 0958  TempSrc:   PainSc: 3                  Zacchary Pompei

## 2018-03-25 NOTE — Progress Notes (Signed)
Patient ID: Joel [name], male   DOB: 04/15/1945, 73 y.o.   MRN: [id number]  Patient seen in pre-op.  Still with some left perianal pain.  Will proceed with formal incision and drainage of left perianal abscess with examination under anesthesia.  The surgical procedure has been discussed with the patient.  Potential risks, benefits, alternative treatments, and expected outcomes have been explained.  All of the patient's questions at this time have been answered.  The likelihood of reaching the patient's treatment goal is good.  The patient understand the proposed surgical procedure and wishes to proceed.   Imogene Burn. Georgette Dover, MD, [facility] Surgery  General/ Trauma Surgery Beeper [phone]  03/25/2018 8:05 AM

## 2018-03-25 NOTE — Transfer of Care (Signed)
Immediate Anesthesia Transfer of Care Note  Patient: Joel [name]  Procedure(s) Performed: INCISION AND DRAINAGE PERI-RECTAL ABSCESS (N/A Rectum)  Patient Location: PACU  Anesthesia Type:General  Level of Consciousness: awake, alert  and oriented  Airway & Oxygen Therapy: Patient Spontanous Breathing and Patient connected to nasal cannula oxygen  Post-op Assessment: Report given to RN and Post -op Vital signs reviewed and stable  Post vital signs: Reviewed and stable  Last Vitals:  Vitals Value Taken Time  BP    Temp    Pulse    Resp 43 03/25/2018  9:12 AM  SpO2    Vitals shown include unvalidated device data.  Last Pain:  Vitals:   03/25/18 0502  TempSrc: Oral  PainSc:       Patients Stated Pain Goal: 2 (27/61/84 8592)  Complications: No apparent anesthesia complications

## 2018-03-25 NOTE — Anesthesia Procedure Notes (Signed)
Procedure Name: LMA Insertion Date/Time: 03/25/2018 8:40 AM Performed by: Trinna Post., CRNA Pre-anesthesia Checklist: Patient identified, Emergency Drugs available, Suction available, Patient being monitored and Timeout performed Patient Re-evaluated:Patient Re-evaluated prior to induction Oxygen Delivery Method: Circle system utilized Preoxygenation: Pre-oxygenation with 100% oxygen Induction Type: IV induction Ventilation: Mask ventilation without difficulty LMA: LMA inserted LMA Size: 4.0 Number of attempts: 1 Placement Confirmation: positive ETCO2 and breath sounds checked- equal and bilateral Tube secured with: Tape Dental Injury: Teeth and Oropharynx as per pre-operative assessment

## 2018-03-25 NOTE — Progress Notes (Signed)
Pt feeling great and wants to go home.  DC instructions reviewed and copy given.

## 2018-03-26 ENCOUNTER — Encounter (HOSPITAL_COMMUNITY): Payer: Self-pay | Admitting: Surgery

## 2018-04-02 DIAGNOSIS — L821 Other seborrheic keratosis: Secondary | ICD-10-CM | POA: Diagnosis not present

## 2018-04-07 ENCOUNTER — Telehealth: Payer: Self-pay

## 2018-04-07 NOTE — Telephone Encounter (Signed)
Call placed to Pt.  Advised at this time-routine follow up appointments are being cancelled to reduce possible exposure for our patients.  Per Pt-feels fine.  Has occasional flutters in chest but nothing abnormal.  Takes an addl flecainide as needed.  Advised appointment would be cancelled and Pt would be contacted to reschedule.  Advised to call office if any needs.

## 2018-04-08 ENCOUNTER — Ambulatory Visit: Payer: Medicare HMO | Admitting: Internal Medicine

## 2018-04-09 ENCOUNTER — Telehealth: Payer: Self-pay | Admitting: Internal Medicine

## 2018-04-09 NOTE — Telephone Encounter (Signed)
Hi Joel [name], I just spoke with pt to cancel his appt on 3/27. He would like to use telehealth option or speak with you, he stated that he only needs to know if he needs to continue taking medication for GERD, he said that he is feeling fine.

## 2018-04-09 NOTE — Telephone Encounter (Signed)
Patient reports his symptoms are dramically improved on pantoprazole.  Your endo note indicated you were going to consider at OV lifestyle changes if famotidine.  He is asking if he should try the famotidine or stay on the pantoprazole until he comes in to see you after Covid crisis is over

## 2018-04-10 NOTE — Telephone Encounter (Signed)
I suggest he take the pantoprazole for 3 months and then stop and can be seen if needed after that

## 2018-04-10 NOTE — Telephone Encounter (Signed)
The patient has been notified of this information and all questions answered.

## 2018-04-13 ENCOUNTER — Other Ambulatory Visit: Payer: Self-pay | Admitting: Internal Medicine

## 2018-04-17 ENCOUNTER — Ambulatory Visit: Payer: Medicare HMO | Admitting: Internal Medicine

## 2018-04-29 DIAGNOSIS — Z125 Encounter for screening for malignant neoplasm of prostate: Secondary | ICD-10-CM | POA: Diagnosis not present

## 2018-04-29 DIAGNOSIS — E7849 Other hyperlipidemia: Secondary | ICD-10-CM | POA: Diagnosis not present

## 2018-04-29 DIAGNOSIS — Z79899 Other long term (current) drug therapy: Secondary | ICD-10-CM | POA: Diagnosis not present

## 2018-05-08 ENCOUNTER — Other Ambulatory Visit: Payer: Self-pay | Admitting: Internal Medicine

## 2018-06-02 DIAGNOSIS — R82998 Other abnormal findings in urine: Secondary | ICD-10-CM | POA: Diagnosis not present

## 2018-06-04 ENCOUNTER — Ambulatory Visit: Payer: Self-pay | Admitting: General Surgery

## 2018-06-04 DIAGNOSIS — K603 Anal fistula: Secondary | ICD-10-CM | POA: Diagnosis not present

## 2018-06-05 DIAGNOSIS — E785 Hyperlipidemia, unspecified: Secondary | ICD-10-CM | POA: Diagnosis not present

## 2018-06-05 DIAGNOSIS — Z0000 Encounter for general adult medical examination without abnormal findings: Secondary | ICD-10-CM | POA: Diagnosis not present

## 2018-06-05 DIAGNOSIS — B351 Tinea unguium: Secondary | ICD-10-CM | POA: Diagnosis not present

## 2018-06-05 DIAGNOSIS — N401 Enlarged prostate with lower urinary tract symptoms: Secondary | ICD-10-CM | POA: Diagnosis not present

## 2018-06-05 DIAGNOSIS — I480 Paroxysmal atrial fibrillation: Secondary | ICD-10-CM | POA: Diagnosis not present

## 2018-06-05 DIAGNOSIS — Z7901 Long term (current) use of anticoagulants: Secondary | ICD-10-CM | POA: Diagnosis not present

## 2018-06-05 DIAGNOSIS — K6289 Other specified diseases of anus and rectum: Secondary | ICD-10-CM | POA: Diagnosis not present

## 2018-06-05 DIAGNOSIS — D7289 Other specified disorders of white blood cells: Secondary | ICD-10-CM | POA: Diagnosis not present

## 2018-06-05 DIAGNOSIS — K610 Anal abscess: Secondary | ICD-10-CM | POA: Diagnosis not present

## 2018-06-13 ENCOUNTER — Other Ambulatory Visit: Payer: Self-pay | Admitting: Internal Medicine

## 2018-06-13 DIAGNOSIS — I480 Paroxysmal atrial fibrillation: Secondary | ICD-10-CM

## 2018-06-24 DIAGNOSIS — H52202 Unspecified astigmatism, left eye: Secondary | ICD-10-CM | POA: Diagnosis not present

## 2018-06-24 DIAGNOSIS — H5201 Hypermetropia, right eye: Secondary | ICD-10-CM | POA: Diagnosis not present

## 2018-06-25 ENCOUNTER — Ambulatory Visit: Payer: Medicare HMO | Admitting: Podiatry

## 2018-06-25 ENCOUNTER — Ambulatory Visit (INDEPENDENT_AMBULATORY_CARE_PROVIDER_SITE_OTHER): Payer: Medicare HMO

## 2018-06-25 ENCOUNTER — Other Ambulatory Visit: Payer: Self-pay | Admitting: Podiatry

## 2018-06-25 ENCOUNTER — Encounter: Payer: Self-pay | Admitting: Podiatry

## 2018-06-25 ENCOUNTER — Other Ambulatory Visit: Payer: Self-pay

## 2018-06-25 VITALS — BP 104/61 | HR 48 | Temp 98.4°F | Resp 16

## 2018-06-25 DIAGNOSIS — M779 Enthesopathy, unspecified: Secondary | ICD-10-CM

## 2018-06-25 DIAGNOSIS — M7751 Other enthesopathy of right foot: Secondary | ICD-10-CM | POA: Diagnosis not present

## 2018-06-25 DIAGNOSIS — M205X1 Other deformities of toe(s) (acquired), right foot: Secondary | ICD-10-CM

## 2018-06-25 DIAGNOSIS — D689 Coagulation defect, unspecified: Secondary | ICD-10-CM

## 2018-06-25 DIAGNOSIS — M79671 Pain in right foot: Secondary | ICD-10-CM

## 2018-06-25 DIAGNOSIS — Q828 Other specified congenital malformations of skin: Secondary | ICD-10-CM

## 2018-06-25 MED ORDER — TRIAMCINOLONE ACETONIDE 10 MG/ML IJ SUSP
10.0000 mg | Freq: Once | INTRAMUSCULAR | Status: AC
  Administered 2018-06-25: 10 mg

## 2018-06-25 NOTE — Progress Notes (Signed)
   Subjective:    Patient ID: Joel [name], male    DOB: 27-Sep-1945, 73 y.o.   MRN: [id number]  HPI    Review of Systems  All other systems reviewed and are negative.      Objective:   Physical Exam        Assessment & Plan:

## 2018-06-25 NOTE — Progress Notes (Signed)
Subjective:   Patient ID: Joel [name], male   DOB: 73 y.o.   MRN: [id number]   HPI Patient presents stating that not been here for years and the orthotics were beneficial but I have lesions that are very painful and fluid that is developed in my forefoot right.  Also concerned about nail discoloration dry skin and loss of motion of my big toe joint right.  Patient does not smoke and likes to be active   Review of Systems  All other systems reviewed and are negative.       Objective:  Physical Exam Vitals signs and nursing note reviewed.  Constitutional:      Appearance: He is well-developed.  Pulmonary:     Effort: Pulmonary effort is normal.  Musculoskeletal: Normal range of motion.  Skin:    General: Skin is warm.  Neurological:     Mental Status: He is alert.     Neurovascular status found to be intact muscle strength is adequate range of motion within normal limits with patient found to have moderate structural bunion deformity right elevated second toe inflammation around the second MPJ plantar keratotic lesions x3 and dry skin formation.  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

## 2018-06-29 ENCOUNTER — Other Ambulatory Visit: Payer: Self-pay | Admitting: Internal Medicine

## 2018-06-29 NOTE — Telephone Encounter (Signed)
Age 73, weight 88kg, SCr 0.83 on 03/25/18, afib indication. Last seen March 2019, is overdue however will still send in refill since office visits are limited currently due to [place].

## 2018-08-08 ENCOUNTER — Other Ambulatory Visit (HOSPITAL_COMMUNITY)
Admission: RE | Admit: 2018-08-08 | Discharge: 2018-08-08 | Disposition: A | Discharge status: Home / Self Care | Payer: Medicare HMO | Source: Ambulatory Visit | Attending: General Surgery | Admitting: General Surgery

## 2018-08-08 DIAGNOSIS — Z1159 Encounter for screening for other viral diseases: Secondary | ICD-10-CM | POA: Insufficient documentation

## 2018-08-08 LAB — SARS CORONAVIRUS 2 (TAT 6-24 HRS): SARS Coronavirus 2: NEGATIVE

## 2018-08-11 ENCOUNTER — Encounter (HOSPITAL_BASED_OUTPATIENT_CLINIC_OR_DEPARTMENT_OTHER): Payer: Self-pay | Admitting: *Deleted

## 2018-08-11 ENCOUNTER — Other Ambulatory Visit: Payer: Self-pay

## 2018-08-11 NOTE — Progress Notes (Signed)
Spoke with patient via telephone for pre op interview. NPO after MN. Patient to take Tambocor morning of surgery with a sip of water. Arrival time 1115.

## 2018-08-12 ENCOUNTER — Ambulatory Visit (HOSPITAL_BASED_OUTPATIENT_CLINIC_OR_DEPARTMENT_OTHER): Payer: Medicare HMO | Admitting: Anesthesiology

## 2018-08-12 ENCOUNTER — Encounter (HOSPITAL_BASED_OUTPATIENT_CLINIC_OR_DEPARTMENT_OTHER)
Admission: RE | Disposition: A | Discharge status: Home / Self Care | Payer: Self-pay | Source: Home / Self Care | Attending: General Surgery

## 2018-08-12 ENCOUNTER — Encounter (HOSPITAL_BASED_OUTPATIENT_CLINIC_OR_DEPARTMENT_OTHER): Payer: Self-pay

## 2018-08-12 ENCOUNTER — Ambulatory Visit (HOSPITAL_BASED_OUTPATIENT_CLINIC_OR_DEPARTMENT_OTHER)
Admission: RE | Admit: 2018-08-12 | Discharge: 2018-08-12 | Disposition: A | Discharge status: Home / Self Care | Payer: Medicare HMO | Attending: General Surgery | Admitting: General Surgery

## 2018-08-12 DIAGNOSIS — K603 Anal fistula: Secondary | ICD-10-CM | POA: Insufficient documentation

## 2018-08-12 DIAGNOSIS — I4891 Unspecified atrial fibrillation: Secondary | ICD-10-CM | POA: Insufficient documentation

## 2018-08-12 DIAGNOSIS — I10 Essential (primary) hypertension: Secondary | ICD-10-CM | POA: Insufficient documentation

## 2018-08-12 DIAGNOSIS — Z8673 Personal history of transient ischemic attack (TIA), and cerebral infarction without residual deficits: Secondary | ICD-10-CM | POA: Diagnosis not present

## 2018-08-12 DIAGNOSIS — Z87891 Personal history of nicotine dependence: Secondary | ICD-10-CM | POA: Insufficient documentation

## 2018-08-12 DIAGNOSIS — G459 Transient cerebral ischemic attack, unspecified: Secondary | ICD-10-CM | POA: Diagnosis not present

## 2018-08-12 HISTORY — PX: RECTAL EXAM UNDER ANESTHESIA: SHX6399

## 2018-08-12 HISTORY — DX: Cardiac arrhythmia, unspecified: I49.9

## 2018-08-12 HISTORY — PX: FISTULOTOMY: SHX6413

## 2018-08-12 SURGERY — EXAM UNDER ANESTHESIA, RECTUM
Anesthesia: Monitor Anesthesia Care | Site: Buttocks

## 2018-08-12 MED ORDER — PROMETHAZINE HCL 25 MG/ML IJ SOLN
6.2500 mg | INTRAMUSCULAR | Status: DC | PRN
  Filled 2018-08-12: qty 1

## 2018-08-12 MED ORDER — ACETAMINOPHEN 500 MG PO TABS
1000.0000 mg | ORAL_TABLET | Freq: Once | ORAL | Status: DC
  Filled 2018-08-12: qty 2

## 2018-08-12 MED ORDER — LACTATED RINGERS IV SOLN
INTRAVENOUS | Status: DC
  Administered 2018-08-12 (×2): via INTRAVENOUS
  Filled 2018-08-12: qty 1000

## 2018-08-12 MED ORDER — HYDROCODONE-ACETAMINOPHEN 5-325 MG PO TABS: 1.0000 | ORAL_TABLET | Freq: Four times a day (QID) | ORAL | 0 refills | Status: DC | PRN

## 2018-08-12 MED ORDER — ONDANSETRON HCL 4 MG/2ML IJ SOLN
INTRAMUSCULAR | Status: AC
  Filled 2018-08-12: qty 2

## 2018-08-12 MED ORDER — SODIUM CHLORIDE 0.9% FLUSH
3.0000 mL | Freq: Two times a day (BID) | INTRAVENOUS | Status: DC
  Filled 2018-08-12: qty 3

## 2018-08-12 MED ORDER — ACETAMINOPHEN 500 MG PO TABS
ORAL_TABLET | ORAL | Status: AC
  Filled 2018-08-12: qty 2

## 2018-08-12 MED ORDER — LIDOCAINE 2% (20 MG/ML) 5 ML SYRINGE
INTRAMUSCULAR | Status: DC | PRN
  Administered 2018-08-12: 25 mg via INTRAVENOUS

## 2018-08-12 MED ORDER — LIDOCAINE 2% (20 MG/ML) 5 ML SYRINGE
INTRAMUSCULAR | Status: AC
  Filled 2018-08-12: qty 5

## 2018-08-12 MED ORDER — FENTANYL CITRATE (PF) 100 MCG/2ML IJ SOLN
25.0000 ug | INTRAMUSCULAR | Status: DC | PRN
  Filled 2018-08-12: qty 1

## 2018-08-12 MED ORDER — ACETAMINOPHEN 650 MG RE SUPP
650.0000 mg | RECTAL | Status: DC | PRN
  Filled 2018-08-12: qty 1

## 2018-08-12 MED ORDER — ACETAMINOPHEN 500 MG PO TABS
1000.0000 mg | ORAL_TABLET | ORAL | Status: AC
  Administered 2018-08-12: 1000 mg via ORAL
  Filled 2018-08-12: qty 2

## 2018-08-12 MED ORDER — KETAMINE HCL 10 MG/ML IJ SOLN
INTRAMUSCULAR | Status: DC | PRN
  Administered 2018-08-12: 10 mg via INTRAVENOUS
  Administered 2018-08-12: 20 mg via INTRAVENOUS

## 2018-08-12 MED ORDER — SODIUM CHLORIDE 0.9 % IV SOLN
250.0000 mL | INTRAVENOUS | Status: DC | PRN
  Filled 2018-08-12: qty 250

## 2018-08-12 MED ORDER — LIDOCAINE 5 % EX OINT
TOPICAL_OINTMENT | CUTANEOUS | Status: DC | PRN
  Administered 2018-08-12: 1

## 2018-08-12 MED ORDER — OXYCODONE HCL 5 MG PO TABS
5.0000 mg | ORAL_TABLET | ORAL | Status: DC | PRN
  Filled 2018-08-12: qty 2

## 2018-08-12 MED ORDER — ONDANSETRON HCL 4 MG/2ML IJ SOLN
INTRAMUSCULAR | Status: DC | PRN
  Administered 2018-08-12: 4 mg via INTRAVENOUS

## 2018-08-12 MED ORDER — FENTANYL CITRATE (PF) 100 MCG/2ML IJ SOLN
INTRAMUSCULAR | Status: AC
  Filled 2018-08-12: qty 2

## 2018-08-12 MED ORDER — BUPIVACAINE-EPINEPHRINE 0.5% -1:200000 IJ SOLN
INTRAMUSCULAR | Status: DC | PRN
  Administered 2018-08-12: 30 mL

## 2018-08-12 MED ORDER — PROPOFOL 500 MG/50ML IV EMUL
INTRAVENOUS | Status: DC | PRN
  Administered 2018-08-12: 200 ug/kg/min via INTRAVENOUS

## 2018-08-12 MED ORDER — PHENYLEPHRINE 40 MCG/ML (10ML) SYRINGE FOR IV PUSH (FOR BLOOD PRESSURE SUPPORT)
PREFILLED_SYRINGE | INTRAVENOUS | Status: AC
  Filled 2018-08-12: qty 10

## 2018-08-12 MED ORDER — SODIUM CHLORIDE 0.9% FLUSH
3.0000 mL | INTRAVENOUS | Status: DC | PRN
  Filled 2018-08-12: qty 3

## 2018-08-12 MED ORDER — PROPOFOL 500 MG/50ML IV EMUL
INTRAVENOUS | Status: AC
  Filled 2018-08-12: qty 100

## 2018-08-12 MED ORDER — ACETAMINOPHEN 325 MG PO TABS
650.0000 mg | ORAL_TABLET | ORAL | Status: DC | PRN
  Filled 2018-08-12: qty 2

## 2018-08-12 MED ORDER — GABAPENTIN 300 MG PO CAPS
ORAL_CAPSULE | ORAL | Status: AC
  Filled 2018-08-12: qty 1

## 2018-08-12 MED ORDER — GABAPENTIN 300 MG PO CAPS
300.0000 mg | ORAL_CAPSULE | ORAL | Status: AC
  Administered 2018-08-12: 11:00:00 300 mg via ORAL
  Filled 2018-08-12: qty 1

## 2018-08-12 SURGICAL SUPPLY — 50 items
APL SKNCLS STERI-STRIP NONHPOA (GAUZE/BANDAGES/DRESSINGS) ×2
BENZOIN TINCTURE PRP APPL 2/3 (GAUZE/BANDAGES/DRESSINGS) ×4 IMPLANT
BLADE EXTENDED COATED 6.5IN (ELECTRODE) IMPLANT
BLADE HEX COATED 2.75 (ELECTRODE) ×2 IMPLANT
BLADE SURG 10 STRL SS (BLADE) IMPLANT
BRIEF STRETCH FOR OB PAD LRG (UNDERPADS AND DIAPERS) ×2 IMPLANT
CANISTER SUCT 3000ML PPV (MISCELLANEOUS) ×2 IMPLANT
COVER BACK TABLE 60X90IN (DRAPES) ×2 IMPLANT
COVER MAYO STAND STRL (DRAPES) ×2 IMPLANT
COVER WAND RF STERILE (DRAPES) ×2 IMPLANT
DECANTER SPIKE VIAL GLASS SM (MISCELLANEOUS) ×2 IMPLANT
DRAPE LAPAROTOMY 100X72 PEDS (DRAPES) ×2 IMPLANT
DRAPE UTILITY XL STRL (DRAPES) ×2 IMPLANT
ELECT REM PT RETURN 9FT ADLT (ELECTROSURGICAL) ×2
ELECTRODE REM PT RTRN 9FT ADLT (ELECTROSURGICAL) ×1 IMPLANT
GAUZE SPONGE 4X4 12PLY STRL (GAUZE/BANDAGES/DRESSINGS) ×2 IMPLANT
GAUZE SPONGE 4X4 12PLY STRL LF (GAUZE/BANDAGES/DRESSINGS) ×1 IMPLANT
GLOVE BIO SURGEON STRL SZ 6.5 (GLOVE) ×2 IMPLANT
GLOVE BIOGEL PI IND STRL 7.0 (GLOVE) ×1 IMPLANT
GLOVE BIOGEL PI INDICATOR 7.0 (GLOVE) ×1
GOWN SPEC L3 XXLG W/TWL (GOWN DISPOSABLE) ×2 IMPLANT
HYDROGEN PEROXIDE 16OZ (MISCELLANEOUS) ×2 IMPLANT
IV CATH 14GX2 1/4 (CATHETERS) ×2 IMPLANT
IV CATH 18G SAFETY (IV SOLUTION) ×2 IMPLANT
KIT SIGMOIDOSCOPE (SET/KITS/TRAYS/PACK) IMPLANT
KIT TURNOVER CYSTO (KITS) ×2 IMPLANT
LOOP VESSEL MAXI BLUE (MISCELLANEOUS) IMPLANT
NEEDLE HYPO 22GX1.5 SAFETY (NEEDLE) ×2 IMPLANT
NS IRRIG 500ML POUR BTL (IV SOLUTION) ×2 IMPLANT
PACK BASIN DAY SURGERY FS (CUSTOM PROCEDURE TRAY) ×2 IMPLANT
PAD ABD 8X10 STRL (GAUZE/BANDAGES/DRESSINGS) ×2 IMPLANT
PAD ARMBOARD 7.5X6 YLW CONV (MISCELLANEOUS) IMPLANT
PENCIL BUTTON HOLSTER BLD 10FT (ELECTRODE) ×2 IMPLANT
SPONGE HEMORRHOID 8X3CM (HEMOSTASIS) IMPLANT
SPONGE SURGIFOAM ABS GEL 12-7 (HEMOSTASIS) IMPLANT
SUCTION FRAZIER HANDLE 10FR (MISCELLANEOUS)
SUCTION TUBE FRAZIER 10FR DISP (MISCELLANEOUS) IMPLANT
SUT CHROMIC 2 0 SH (SUTURE) ×1 IMPLANT
SUT CHROMIC 3 0 SH 27 (SUTURE) IMPLANT
SUT ETHIBOND 0 (SUTURE) IMPLANT
SUT VIC AB 2-0 SH 27 (SUTURE)
SUT VIC AB 2-0 SH 27XBRD (SUTURE) IMPLANT
SUT VIC AB 3-0 SH 18 (SUTURE) IMPLANT
SUT VIC AB 3-0 SH 27 (SUTURE)
SUT VIC AB 3-0 SH 27XBRD (SUTURE) IMPLANT
SYR CONTROL 10ML LL (SYRINGE) ×2 IMPLANT
TOWEL OR 17X26 10 PK STRL BLUE (TOWEL DISPOSABLE) ×2 IMPLANT
TRAY DSU PREP LF (CUSTOM PROCEDURE TRAY) ×2 IMPLANT
TUBE CONNECTING 12X1/4 (SUCTIONS) ×2 IMPLANT
YANKAUER SUCT BULB TIP NO VENT (SUCTIONS) ×2 IMPLANT

## 2018-08-12 NOTE — Op Note (Signed)
08/12/2018  12:52 PM  PATIENT:  Joel [name]  73 y.o. male  Patient Care Team: Crist Infante, MD as PCP - General (Internal Medicine) Evans Lance, MD as PCP - Cardiology (Cardiology)  PRE-OPERATIVE DIAGNOSIS:  ANAL FISTULA  POST-OPERATIVE DIAGNOSIS:  ANAL FISTULA  PROCEDURE:   ANAL EXAM UNDER ANESTHESIA FISTULOTOMY    Surgeon(s): Leighton Ruff, MD  ASSISTANT: none   ANESTHESIA:   local and MAC  SPECIMEN:  No Specimen  DISPOSITION OF SPECIMEN:  N/A  COUNTS:  YES  PLAN OF CARE: Discharge to home after PACU  PATIENT DISPOSITION:  PACU - hemodynamically stable.  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  MAC anesthesia was induced without difficulty. The patient was then positioned in prone jackknife position with buttocks gently taped apart.  The patient was then prepped and draped in usual sterile fashion.  SCDs were noted to be in place prior to the initiation of anesthesia. A surgical timeout was performed indicating the correct patient, procedure, positioning and need for preoperative antibiotics.  A rectal block was performed using Marcaine with epinephrine.    I began with a digital rectal exam.  There was sphincter hypertension noted.  I dilated his anal canal gently and then placed a Hill-Ferguson anoscope into the anal canal and evaluated this completely.  The patient had an internal opening in the distal posterior midline anal canal.  I was able to place an S-shaped fistula probe into the external opening.  This exited at the internal opening.  There was only a small amount (approximately 5 mm) of internal sphincter involved.  I decided to perform a fistulotomy using electrocautery.  The patient was also noted  to have an external opening just distal to the previous external opening.  I placed an S-shaped fistula probe into this and it traversed underneath of the superficial portion of the external sphincter and then entered into the previously incised fistula cavity.  I divided the remaining overlying tissue using electrocautery.  This appeared to be mainly scar.  I then marsupialized the edges using a 2-0 chromic suture.  Hemostasis was good.  A dressing was applied.  The patient was awakened from anesthesia and sent to the postanesthesia care unit in stable condition.  All counts were correct per operating room staff.

## 2018-08-12 NOTE — Anesthesia Preprocedure Evaluation (Addendum)
Anesthesia Evaluation  Patient identified by MRN, date of birth, ID band Patient awake    Reviewed: Allergy & Precautions, NPO status , Patient's Chart, lab work & pertinent test results  History of Anesthesia Complications Negative for: history of anesthetic complications  Airway Mallampati: II  TM Distance: >3 FB Neck ROM: Full    Dental no notable dental hx. (+) Dental Advisory Given, Teeth Intact   Pulmonary former smoker,    Pulmonary exam normal        Cardiovascular hypertension, Pt. on home beta blockers Normal cardiovascular exam+ dysrhythmias Atrial Fibrillation      Neuro/Psych PSYCHIATRIC DISORDERS Anxiety TIA   GI/Hepatic Neg liver ROS, GERD  ,  Endo/Other  negative endocrine ROS  Renal/GU negative Renal ROS     Musculoskeletal   Abdominal   Peds  Hematology   Anesthesia Other Findings   Reproductive/Obstetrics                           Anesthesia Physical  Anesthesia Plan  ASA: III  Anesthesia Plan: MAC   Post-op Pain Management:    Induction:   PONV Risk Score and Plan: 2 and Ondansetron and Propofol infusion  Airway Management Planned: Natural Airway  Additional Equipment:   Intra-op Plan:   Post-operative Plan:   Informed Consent: I have reviewed the patients History and Physical, chart, labs and discussed the procedure including the risks, benefits and alternatives for the proposed anesthesia with the patient or authorized representative who has indicated his/her understanding and acceptance.       Plan Discussed with: Anesthesiologist and CRNA  Anesthesia Plan Comments:        Anesthesia Quick Evaluation

## 2018-08-12 NOTE — Transfer of Care (Signed)
Immediate Anesthesia Transfer of Care Note  Patient: Ramir Malerba Stroud  Procedure(s) Performed: ANAL EXAM UNDER ANESTHESIA (N/A Buttocks) FISTULOTOMY (N/A Buttocks)  Patient Location: PACU  Anesthesia Type:MAC  Level of Consciousness: awake, alert , oriented and patient cooperative  Airway & Oxygen Therapy: Patient Spontanous Breathing and Patient connected to face mask oxygen  Post-op Assessment: Report given to RN and Post -op Vital signs reviewed and stable  Post vital signs: Reviewed  Last Vitals:  Vitals Value Taken Time  BP    Temp    Pulse 67 08/12/18 1301  Resp 11 08/12/18 1301  SpO2 100 % 08/12/18 1301  Vitals shown include unvalidated device data.  Last Pain:  Vitals:   08/12/18 1104  TempSrc: Oral  PainSc: 0-No pain      Patients Stated Pain Goal: 4 (20/03/79 4446)  Complications: No apparent anesthesia complications

## 2018-08-12 NOTE — H&P (Signed)
History of Present Illness  The patient is a 73 year old male who presents with anal fistula.Status post incision and drainage of a small perirectal abscess on the left side. This was performed on 03/25/18. He did have a small Penrose drain placed, which was subsequently removed. He has no pain. Bowel movements are normal.  He was last seen on 04/20/18. At that time the left perianal incision seemed to be completely healed. However the patient reports that over the last month he has had persistent drainage every couple of days. The drainage is often tan or brown. He continues to wear a pad in his underwear.   Problem List/Past Medical Leighton Ruff, MD; 0/62/3762 11:15 AM) PERIANAL PAIN (K62.89)  PERIRECTAL ABSCESS (K61.1)  ANAL FISTULA (K60.3)   Past Surgical History Leighton Ruff, MD; 09/21/5174 11:15 AM) Anal Fissure Repair  Appendectomy  Colon Polyp Removal - Colonoscopy  Spinal Surgery - Neck  TURP  Vasectomy   Allergies (Sabrina Canty, CMA; 06/04/2018 11:01 AM) Penicillins  Allergies Reconciled   Medication History (Sabrina Canty, CMA; 06/04/2018 11:01 AM) Atorvastatin Calcium (10MG  Tablet, Oral) Active. Eliquis (5MG  Tablet, Oral) Active. Flecainide Acetate (100MG  Tablet, Oral) Active. Metoprolol Succinate ER (25MG  Tablet ER 24HR, Oral) Active. Pantoprazole Sodium (40MG  Tablet DR, Oral) Active. valACYclovir HCl (500MG  Tablet, Oral) Active. Doxycycline Hyclate (100MG  Tablet, Oral) Active. metroNIDAZOLE (500MG  Tablet, Oral) Active. Medications Reconciled  Social History Leighton Ruff, MD; 1/60/7371 11:15 AM) Alcohol use  Moderate alcohol use. No caffeine use  No drug use  Tobacco use  Former smoker.  Family History Leighton Ruff, MD; 0/62/6948 11:15 AM) Arthritis  Mother. Breast Cancer  Mother. Cerebrovascular Accident  Father. Depression  Mother. Heart disease in male family member before age 13     Review of Systems   Skin Not Present- Change in Wart/Mole, Dryness, Hives, Jaundice, New Lesions, Non-Healing Wounds, Rash and Ulcer. HEENT Present- Seasonal Allergies. Not Present- Earache, Hearing Loss, Hoarseness, Nose Bleed, Oral Ulcers, Ringing in the Ears, Sinus Pain, Sore Throat, Visual Disturbances, Wears glasses/contact lenses and Yellow Eyes. Respiratory Not Present- Bloody sputum, Chronic Cough, Difficulty Breathing, Snoring and Wheezing. Cardiovascular Present- Palpitations. Not Present- Chest Pain, Difficulty Breathing Lying Down, Leg Cramps, Rapid Heart Rate, Shortness of Breath and Swelling of Extremities. Male Genitourinary Present- Nocturia. Not Present- Blood in Urine, Change in Urinary Stream, Frequency, Impotence, Painful Urination, Urgency and Urine Leakage. Neurological Not Present- Decreased Memory, Fainting, Headaches, Numbness, Seizures, Tingling, Tremor, Trouble walking and Weakness. Psychiatric Not Present- Anxiety, Bipolar, Change in Sleep Pattern, Depression, Fearful and Frequent crying. Endocrine Not Present- Cold Intolerance, Excessive Hunger, Hair Changes, Heat Intolerance and New Diabetes. Hematology Present- Blood Thinners. Not Present- Easy Bruising, Excessive bleeding, Gland problems, HIV and Persistent Infections.  BP 138/79   Pulse (!) 56   Temp 98.4 F (36.9 C) (Oral)   Resp 16   Ht 6\' 4"  (1.93 m)   Wt 81.5 kg   SpO2 100%   BMI 21.87 kg/m      Physical Exam  General Mental Status-Alert. General Appearance-Not in acute distress. Build & Nutrition-Well nourished. Posture-Normal posture. Gait-Normal.  Head and Neck Head-normocephalic, atraumatic with no lesions or palpable masses. Trachea-midline.  Chest and Lung Exam Chest and lung exam reveals -on auscultation, normal breath sounds, no adventitious sounds and normal vocal resonance.  Cardiovascular Cardiovascular examination reveals -normal heart sounds, regular rate and rhythm with no  murmurs and no digital clubbing, cyanosis, edema, increased warmth or tenderness.  Abdomen Inspection Inspection of  the abdomen reveals - No Hernias. Palpation/Percussion Palpation and Percussion of the abdomen reveal - Soft, Non Tender, No Rigidity (guarding), No hepatosplenomegaly and No Palpable abdominal masses.  Rectal Note: Left lateral anal fistula site with stool present   Neurologic Neurologic evaluation reveals -alert and oriented x 3 with no impairment of recent or remote memory, normal attention span and ability to concentrate, normal sensation and normal coordination.  Musculoskeletal Normal Exam - Bilateral-Upper Extremity Strength Normal and Lower Extremity Strength Normal.    Assessment & Plan   ANAL FISTULA (K60.3) Impression: 73 year old male who presents to the office with a left lateral perianal fistula after drainage of perirectal abscess. We have discussed typical surgical management in detail. We will perform a fistulotomy if minimal muscle complex is involved. We'll plan on a seton placement if more than 20% of sphincter complex is involved. We discussed the risk of fistulotomy which mainly include bleeding pain and a small chance of incontinence. We also discussed the need for additional surgery if a seton was placed.

## 2018-08-12 NOTE — Discharge Instructions (Addendum)

## 2018-08-12 NOTE — Anesthesia Postprocedure Evaluation (Signed)
Anesthesia Post Note  Patient: Andrez Lieurance Steedley  Procedure(s) Performed: ANAL EXAM UNDER ANESTHESIA (N/A Buttocks) FISTULOTOMY (N/A Buttocks)     Patient location during evaluation: PACU Anesthesia Type: MAC Level of consciousness: awake and alert Pain management: pain level controlled Vital Signs Assessment: post-procedure vital signs reviewed and stable Respiratory status: spontaneous breathing and respiratory function stable Cardiovascular status: stable Postop Assessment: no apparent nausea or vomiting Anesthetic complications: no    Last Vitals:  Vitals:   08/12/18 1315 08/12/18 1330  BP: 125/80 124/77  Pulse: (!) 59 (!) 57  Resp: (!) 9 13  Temp:    SpO2: 100% 100%    Last Pain:  Vitals:   08/12/18 1400  TempSrc:   PainSc: 0-No pain                 Rishab Stoudt DANIEL

## 2018-08-13 ENCOUNTER — Encounter (HOSPITAL_BASED_OUTPATIENT_CLINIC_OR_DEPARTMENT_OTHER): Payer: Self-pay | Admitting: General Surgery

## 2018-08-18 DIAGNOSIS — R69 Illness, unspecified: Secondary | ICD-10-CM | POA: Diagnosis not present

## 2018-09-07 ENCOUNTER — Telehealth: Payer: Self-pay | Admitting: *Deleted

## 2018-09-07 NOTE — Telephone Encounter (Signed)
.      COVID-19 Pre-Screening Questions:  . In the past 7 to 10 days have you had a cough,  shortness of breath, headache, congestion, fever (100 or greater) body aches, chills, sore throat, or sudden loss of taste or sense of smell?NO . Have you been around anyone with known Covid 19?NO . Have you been around anyone who is awaiting Covid 19 test results in the past 7 to 10 days?NO . Have you been around anyone who has been exposed to Covid 19, or has mentioned symptoms of Covid 19 within the past 7 to 10 days?NO  Patient answered NO to all questions. I made him aware of our visitor and mask policy and ask that he arrive no sooner that 15 minutes prior to his appt time. He verbalized his understanding and agrees with this plan.  If you have any concerns/questions about symptoms patients report during screening (either on the phone or at threshold). Contact the provider seeing the patient or DOD for further guidance.  If neither are available contact a member of the leadership team.

## 2018-09-08 ENCOUNTER — Other Ambulatory Visit: Payer: Self-pay

## 2018-09-08 ENCOUNTER — Ambulatory Visit: Payer: Medicare HMO | Admitting: Student

## 2018-09-08 VITALS — BP 122/66 | HR 58 | Ht 76.0 in | Wt 181.0 lb

## 2018-09-08 DIAGNOSIS — I480 Paroxysmal atrial fibrillation: Secondary | ICD-10-CM | POA: Diagnosis not present

## 2018-09-08 NOTE — Progress Notes (Signed)
PCP:  Crist Infante, MD Primary Cardiologist: Cristopher Peru, MD Electrophysiologist: None   [place] Joel [name] is a 73 y.o. male who presents today for routine electrophysiology followup. They are seen for Dr Dr. Lovena Le.   Since last being seen in our clinic, the patient reports doing very well. He feels breakthrough Afib 3-4 times a year (about every 3-4 months), that usually resolves within an hour or two with an extra 1/2 tab of flecainide. He denies symptoms of chest pain, shortness of breath, orthopnea, PND, lower extremity edema, claudication, dizziness, presyncope, syncope, bleeding, or neurologic sequela. The patient is tolerating medications without difficulties.    The patient feels that he is tolerating medications without difficulties and is otherwise without complaint today.   Past Medical History:  Diagnosis Date   Allergy    Anxiety    Arthritis    Atrial fibrillation ([place])    Benign prostatic hypertrophy    Dysrhythmia    Family history of anesthesia complication    "MOTHER DEVELOPED DEMENTIA AFTER SURGERY"   Gastric polyps - hyperplastic 02/20/2018   GERD (gastroesophageal reflux disease)    INTERMITANT -NO MEDS   Hx of adenomatous polyp of colon 05/23/2009   Hypercholesterolemia    Memory loss    Nocturia    Past Surgical History:  Procedure Laterality Date   APPENDECTOMY     COLONOSCOPY W/ POLYPECTOMY     COLOSTOMY     FISTULOTOMY N/A 08/12/2018   Procedure: FISTULOTOMY;  Surgeon: Leighton Ruff, MD;  Location: [facility];  Service: General;  Laterality: N/A;   INCISION AND DRAINAGE ABSCESS N/A 03/25/2018   Procedure: INCISION AND DRAINAGE PERI-RECTAL ABSCESS;  Surgeon: Donnie Mesa, MD;  Location: [facility];  Service: General;  Laterality: N/A;   PLATE NECK  2836   TITANIUM PLATE IN NECK   RECTAL EXAM UNDER ANESTHESIA N/A 08/12/2018   Procedure: ANAL EXAM UNDER ANESTHESIA;  Surgeon: Leighton Ruff, MD;  Location: [facility];  Service: General;  Laterality: N/A;   TRANSURETHRAL RESECTION OF PROSTATE N/A 08/06/2013   Procedure: TRANSURETHRAL RESECTION OF THE PROSTATE (TURP) WITH GYRUS, irrigation of bladder stone, cystoscope;  Surgeon: Ailene Rud, MD;  Location: [facility];  Service: Urology;  Laterality: N/A;    Current Outpatient Medications  Medication Sig Dispense Refill   acetaminophen (TYLENOL) 325 MG tablet Take 325 mg by mouth every 6 (six) hours as needed for fever (or for pain or headaches).     atorvastatin (LIPITOR) 10 MG tablet Take 1 tablet (10 mg total) by mouth daily. (Patient taking differently: Take 10 mg by mouth at bedtime. ) 90 tablet 3   cetirizine (ZYRTEC) 10 MG tablet Take 5 mg by mouth daily as needed (for seasonal allergies).      Coenzyme Q10 (CO Q 10 PO) Take 1 capsule by mouth at bedtime.      ELIQUIS 5 MG TABS tablet TAKE 1 TABLET BY MOUTH TWICE A DAY 180 tablet 1   flecainide (TAMBOCOR) 100 MG tablet TAKE ONE TAB BY MOUTH TWICE DAILY. TAKE 1/2 TAB BY MOUTH DAILY AS NEEDED FOR AFIB, please schedule an appt 225 tablet 0   fluticasone (FLONASE) 50 MCG/ACT nasal spray Place 1 spray into both nostrils daily as needed for allergies or rhinitis.      Glucosamine HCl (GLUCOSAMINE PO) Take 1 tablet by mouth daily.     metoprolol succinate (TOPROL-XL) 25 MG 24 hr tablet TAKE 1/2 TABLET BY MOUTH EVERY DAY 90 tablet  0   Multiple Vitamin (MULTIVITAMIN) capsule Take 1 capsule by mouth daily.     No current facility-administered medications for this visit.     Allergies  Allergen Reactions   Ciprofloxacin Other (See Comments)    Is currently taking Tambocor.Marland KitchenMarland KitchenINTERACTION??   Penicillins Hives and Rash    Did it involve swelling of the face/tongue/throat, SOB, or low BP? Yes Did it involve sudden or severe rash/hives, skin peeling, or any reaction on the inside of your mouth or nose? Unk Did you need to seek medical attention at a hospital or doctor's office?  Yes When did it last happen?1970's If all above answers are NO, may proceed with cephalosporin use.     Social History   Socioeconomic History   Marital status: Married    Spouse name: Bethena Roys   Number of children: 0   Years of education: Not on file   Highest education level: Not on file  Occupational History   Occupation: psychologist  Social Needs   Financial resource strain: Not on file   Food insecurity    Worry: Not on file    Inability: Not on file   Transportation needs    Medical: Not on file    Non-medical: Not on file  Tobacco Use   Smoking status: Former Smoker    Packs/day: 1.00    Types: Cigarettes    Quit date: 01/21/1990    Years since quitting: 28.6   Smokeless tobacco: Never Used  Substance and Sexual Activity   Alcohol use: Yes    Alcohol/week: 8.0 standard drinks    Types: 8 Glasses of wine per week    Comment: 2 DRINKS 4-5 TIMES PER WK   Drug use: No   Sexual activity: Not on file  Lifestyle   Physical activity    Days per week: Not on file    Minutes per session: Not on file   Stress: Not on file  Relationships   Social connections    Talks on phone: Not on file    Gets together: Not on file    Attends religious service: Not on file    Active member of club or organization: Not on file    Attends meetings of clubs or organizations: Not on file    Relationship status: Not on file   Intimate partner violence    Fear of current or ex partner: Not on file    Emotionally abused: Not on file    Physically abused: Not on file    Forced sexual activity: Not on file  Other Topics Concern   Not on file  Social History Narrative   Step Son is mentally handicapped;   Mother has moved into the home     Review of Systems: General: No chills, fever, night sweats or weight changes  Cardiovascular:  No chest pain, dyspnea on exertion, edema, orthopnea, palpitations, paroxysmal nocturnal dyspnea Dermatological: No rash, lesions or  masses Respiratory: No cough, dyspnea Urologic: No hematuria, dysuria Abdominal: No nausea, vomiting, diarrhea, bright red blood per rectum, melena, or hematemesis Neurologic: No visual changes, weakness, changes in mental status All other systems reviewed and are otherwise negative except as noted above.  Physical Exam: Vitals:   09/08/18 1114  BP: 122/66  Pulse: (!) 58  Weight: 181 lb (82.1 kg)  Height: 6\' 4"  (1.93 m)    GEN- The patient is well appearing, alert and oriented x 3 today.   HEENT: normocephalic, atraumatic; sclera clear, conjunctiva pink; hearing intact; oropharynx  clear; neck supple, no JVP Lymph- no cervical lymphadenopathy Lungs- Clear to ausculation bilaterally, normal work of breathing.  No wheezes, rales, rhonchi Heart- Regular rate and rhythm, no murmurs, rubs or gallops, PMI not laterally displaced GI- soft, non-tender, non-distended, bowel sounds present, no hepatosplenomegaly Extremities- no clubbing, cyanosis, or edema; DP/PT/radial pulses 2+ bilaterally MS- no significant deformity or atrophy Skin- warm and dry, no rash or lesion Psych- euthymic mood, full affect Neuro- strength and sensation are intact  EKG is ordered today. Personal review shows Sinus bradycardia at 58 bpm, PR interval 200 bpm  Assessment and Plan:  1. Paroxysmal Atrial fibrillation Maintaining NSR on flecainide and Toprol. He can take an extra as "pill in pocket" as needed Continue Eliquis 5 mg BID. Denies bleeding.  2. SND Previously noted. Asymptomatic. No current indications for PPM. Pt knows to call 911 for syncope or the office for near syncope.   Disposition: Return to clinic to see Dr. Lovena Le in 1 year.   Shirley Friar, PA-C  09/08/18 11:32 AM   Greater than 50% of the 25 minute visit was spent in counseling/coordination of care regarding disease state education, risk factor modification, and medication compliance.

## 2018-09-08 NOTE — Patient Instructions (Signed)
Medication Instructions:  Your physician recommends that you continue on your current medications as directed. Please refer to the Current Medication list given to you today.  If you need a refill on your cardiac medications before your next appointment, please call your pharmacy.   Lab work: NONE ORDERED  TODAY   If you have labs (blood work) drawn today and your tests are completely normal, you will receive your results only by: Marland Kitchen MyChart Message (if you have MyChart) OR . A paper copy in the mail If you have any lab test that is abnormal or we need to change your treatment, we will call you to review the results.  Testing/Procedures: NONE ORDERED  TODAY    Follow-Up: At [facility], you and your health needs are our priority.  As part of our continuing mission to provide you with exceptional heart care, we have created designated Provider Care Teams.  These Care Teams include your primary Cardiologist (physician) and Advanced Practice Providers (APPs -  Physician Assistants and Nurse Practitioners) who all work together to provide you with the care you need, when you need it. You will need a follow up appointment in 1 years.  Please call our office 2 months in advance to schedule this appointment.  You may see Dr. Lovena Le or one of the following Advanced Practice Providers on your designated Care Team:   Chanetta Marshall, NP . Tommye Standard, PA-C . Donavan Burnet PA-C   Any Other Special Instructions Will Be Listed Below (If Applicable).

## 2018-09-13 ENCOUNTER — Other Ambulatory Visit: Payer: Self-pay | Admitting: Internal Medicine

## 2018-09-13 DIAGNOSIS — I480 Paroxysmal atrial fibrillation: Secondary | ICD-10-CM

## 2018-10-24 DIAGNOSIS — Z20828 Contact with and (suspected) exposure to other viral communicable diseases: Secondary | ICD-10-CM | POA: Diagnosis not present

## 2018-10-30 DIAGNOSIS — R69 Illness, unspecified: Secondary | ICD-10-CM | POA: Diagnosis not present

## 2018-11-04 ENCOUNTER — Other Ambulatory Visit: Payer: Self-pay | Admitting: Internal Medicine

## 2018-12-23 ENCOUNTER — Other Ambulatory Visit: Payer: Self-pay | Admitting: Internal Medicine

## 2018-12-23 NOTE — Telephone Encounter (Signed)
Pt last saw Barrington Ellison, [place] on 09/08/18, last labs 03/25/18 Creat 0.83, age 73, weight 82.1kg, based on specified criteria pt is on appropriate dosage of Eliquis 5mg  BID.  Will refill rx.

## 2019-02-09 ENCOUNTER — Telehealth: Payer: Self-pay | Admitting: Internal Medicine

## 2019-02-09 NOTE — Telephone Encounter (Signed)
Call returned to Pt.  Advised to stop Toprol XL.  Will schedule f/u with GT in 2 weeks.

## 2019-02-09 NOTE — Telephone Encounter (Signed)
New Message   STAT if patient feels like he/she is going to faint   1) Are you dizzy now? yes  2) Do you feel faint or have you passed out? no  3) Do you have any other symptoms? NO  4) Have you checked your HR and BP (record if available)? BP 101/60 Pulse of 32 (This morning)          135/76 Pulse of 50 (this morning after exercise.   Patient states that it has been intermittent for the past few weeks. He described as more annoying.

## 2019-02-22 DIAGNOSIS — R69 Illness, unspecified: Secondary | ICD-10-CM | POA: Diagnosis not present

## 2019-02-25 ENCOUNTER — Encounter: Payer: Self-pay | Admitting: Internal Medicine

## 2019-02-25 ENCOUNTER — Other Ambulatory Visit: Payer: Self-pay

## 2019-02-25 ENCOUNTER — Ambulatory Visit: Payer: Medicare HMO | Admitting: Internal Medicine

## 2019-02-25 VITALS — BP 144/82 | HR 66 | Ht 76.0 in | Wt 187.8 lb

## 2019-02-25 DIAGNOSIS — I495 Sick sinus syndrome: Secondary | ICD-10-CM | POA: Diagnosis not present

## 2019-02-25 DIAGNOSIS — I480 Paroxysmal atrial fibrillation: Secondary | ICD-10-CM | POA: Diagnosis not present

## 2019-02-25 NOTE — Patient Instructions (Addendum)

## 2019-02-25 NOTE — Progress Notes (Signed)
HPI Joel [name] returns today for ongoing followup of sinus noded dysfunction, PAF, and dizziness. He has noted some bradycardia and dizziness. He notes that after stopping the toprol, his HR has improved but his dizziness did not change. He is able to walk with his wife and not sob or with any limitation. No edema. His ballroom dancing has been curtailed by Covid.  Allergies  Allergen Reactions  . Ciprofloxacin Other (See Comments)    Is currently taking Tambocor.Marland KitchenMarland KitchenINTERACTION??  . Penicillins Hives and Rash    Did it involve swelling of the face/tongue/throat, SOB, or low BP? Yes Did it involve sudden or severe rash/hives, skin peeling, or any reaction on the inside of your mouth or nose? Unk Did you need to seek medical attention at a hospital or doctor's office? Yes When did it last happen?1970's If all above answers are "NO", may proceed with cephalosporin use.      Current Outpatient Medications  Medication Sig Dispense Refill  . acetaminophen (TYLENOL) 325 MG tablet Take 325 mg by mouth every 6 (six) hours as needed for fever (or for pain or headaches).    Marland Kitchen atorvastatin (LIPITOR) 10 MG tablet Take 1 tablet (10 mg total) by mouth daily. 90 tablet 3  . cetirizine (ZYRTEC) 10 MG tablet Take 5 mg by mouth daily as needed (for seasonal allergies).     . Coenzyme Q10 (CO Q 10 PO) Take 1 capsule by mouth at bedtime.     Marland Kitchen ELIQUIS 5 MG TABS tablet TAKE 1 TABLET BY MOUTH TWICE A DAY 180 tablet 1  . flecainide (TAMBOCOR) 100 MG tablet TAKE 1 TABLET BY MOUTH TWICE DAILY. ALSO TAKE 1/2 TABLET BY MOUTH AS NEEDED FOR AFIB. 225 tablet 3  . Glucosamine HCl (GLUCOSAMINE PO) Take 1 tablet by mouth daily.    . metoprolol succinate (TOPROL-XL) 25 MG 24 hr tablet TAKE 1/2 TABLET BY MOUTH EVERY DAY 45 tablet 2  . Multiple Vitamin (MULTIVITAMIN) capsule Take 1 capsule by mouth daily.    . valACYclovir (VALTREX) 500 MG tablet Take 500 mg by mouth as needed for other.     No current  facility-administered medications for this visit.     Past Medical History:  Diagnosis Date  . Allergy   . Anxiety   . Arthritis   . Atrial fibrillation ([place])   . Benign prostatic hypertrophy   . Dysrhythmia   . Family history of anesthesia complication    "MOTHER DEVELOPED DEMENTIA AFTER SURGERY"  . Gastric polyps - hyperplastic 02/20/2018  . GERD (gastroesophageal reflux disease)    INTERMITANT -NO MEDS  . Hx of adenomatous polyp of colon 05/23/2009  . Hypercholesterolemia   . Memory loss   . Nocturia     ROS:   All systems reviewed and negative except as noted in the HPI.   Past Surgical History:  Procedure Laterality Date  . APPENDECTOMY    . COLONOSCOPY W/ POLYPECTOMY    . COLOSTOMY    . FISTULOTOMY N/A 08/12/2018   Procedure: FISTULOTOMY;  Surgeon: Leighton Ruff, MD;  [facility];  Service: General;  Laterality: N/A;  . INCISION AND DRAINAGE ABSCESS N/A 03/25/2018   Procedure: INCISION AND DRAINAGE PERI-RECTAL ABSCESS;  Surgeon: Donnie Mesa, MD;  Location: [facility];  Service: General;  Laterality: N/A;  . PLATE NECK  [id number]   TITANIUM PLATE IN NECK  . RECTAL EXAM UNDER ANESTHESIA N/A 08/12/2018   Procedure: ANAL EXAM UNDER ANESTHESIA;  Surgeon:  Leighton Ruff, MD;  Location: [facility];  Service: General;  Laterality: N/A;  . TRANSURETHRAL RESECTION OF PROSTATE N/A 08/06/2013   Procedure: TRANSURETHRAL RESECTION OF THE PROSTATE (TURP) WITH GYRUS, irrigation of bladder stone, cystoscope;  Surgeon: Ailene Rud, MD;  Location: [facility];  Service: Urology;  Laterality: N/A;     Family History  Problem Relation Age of Onset  . Hypertension Mother   . Hyperlipidemia Mother   . GER disease Mother   . Arrhythmia Father   . Stroke Father        39s  . Hypertension Sister   . GER disease Sister   . Other Sister        tumor removed from her colon, possible pre-cancerous, foot of her bowel removed  . Colon cancer Sister   .  Esophageal cancer Other   . Stomach cancer Other   . Rectal cancer Other      Social History   Socioeconomic History  . Marital status: Married    Spouse name: Bethena Roys  . Number of children: 0  . Years of education: Not on file  . Highest education level: Not on file  Occupational History  . Occupation: psychologist  Tobacco Use  . Smoking status: Former Smoker    Packs/day: 1.00    Types: Cigarettes    Quit date: 01/21/1990    Years since quitting: 29.1  . Smokeless tobacco: Never Used  Substance and Sexual Activity  . Alcohol use: Yes    Alcohol/week: 8.0 standard drinks    Types: 8 Glasses of wine per week    Comment: 2 DRINKS 4-5 TIMES PER WK  . Drug use: No  . Sexual activity: Not on file  Other Topics Concern  . Not on file  Social History Narrative   Step Son is mentally handicapped;   Mother has moved into the home   Social Determinants of Health   Financial Resource Strain:   . Difficulty of Paying Living Expenses: Not on file  Food Insecurity:   . Worried About Charity fundraiser in the Last Year: Not on file  . Ran Out of Food in the Last Year: Not on file  Transportation Needs:   . Lack of Transportation (Medical): Not on file  . Lack of Transportation (Non-Medical): Not on file  Physical Activity:   . Days of Exercise per Week: Not on file  . Minutes of Exercise per Session: Not on file  Stress:   . Feeling of Stress : Not on file  Social Connections:   . Frequency of Communication with Friends and Family: Not on file  . Frequency of Social Gatherings with Friends and Family: Not on file  . Attends Religious Services: Not on file  . Active Member of Clubs or Organizations: Not on file  . Attends Archivist Meetings: Not on file  . Marital Status: Not on file  Intimate Partner Violence:   . Fear of Current or Ex-Partner: Not on file  . Emotionally Abused: Not on file  . Physically Abused: Not on file  . Sexually Abused: Not on file      BP (!) 144/82   Pulse 66   Ht 6\' 4"  (1.93 m)   Wt 187 lb 12.8 oz (85.2 kg)   SpO2 97%   BMI 22.86 kg/m   Physical Exam:  Well appearing NAD HEENT: Unremarkable Neck:  No JVD, no thyromegally Lymphatics:  No adenopathy Back:  No CVA tenderness Lungs:  Clear HEART:  Regular rate rhythm, no murmurs, no rubs, no clicks Abd:  soft, positive bowel sounds, no organomegally, no rebound, no guarding Ext:  2 plus pulses, no edema, no cyanosis, no clubbing Skin:  No rashes no nodules Neuro:  CN II through XII intact, motor grossly intact  EKG - nsr  Assess/Plan: 1. Sinus node dysfunction - stopping his beta blocker has resulted in improvement of his HR. He will continue his other meds. 2. PAF - He is doing very well with no symptomatic atrial fib on flecainide.  3. HTN - his bp is up a bit. If it remains increased then we will consider adding additional anti-hypertensive meds. 4. Coags - he has had no bleeding. We will follow. Continue [place]um.  Mikle Bosworth.D.

## 2019-02-26 ENCOUNTER — Telehealth: Payer: Self-pay | Admitting: Internal Medicine

## 2019-02-26 NOTE — Telephone Encounter (Signed)
Yes

## 2019-02-26 NOTE — Telephone Encounter (Signed)
Please advise Sir, thank you. 

## 2019-02-26 NOTE — Telephone Encounter (Signed)
I left Mr Vanderheyden a detailed message that this would be okay per Dr Carlean Purl.

## 2019-03-14 ENCOUNTER — Ambulatory Visit: Payer: Medicare HMO | Attending: Internal Medicine

## 2019-03-14 DIAGNOSIS — Z23 Encounter for immunization: Secondary | ICD-10-CM | POA: Insufficient documentation

## 2019-03-14 NOTE — Progress Notes (Signed)
   Covid-19 Vaccination Clinic  Name:  Joel [name]    MRN: [id number] DOB: Jul 26, 1945  03/14/2019  [name] was observed post Covid-19 immunization for 15 minutes without incidence. He was provided with Vaccine Information Sheet and instruction to access the V-Safe system.   [name] was instructed to call 911 with any severe reactions post vaccine: Marland Kitchen Difficulty breathing  . Swelling of your face and throat  . A fast heartbeat  . A bad rash all over your body  . Dizziness and weakness    Immunizations Administered    Name Date Dose VIS Date Route   Pfizer COVID-19 Vaccine 03/14/2019  1:17 PM 0.3 mL 01/01/2019 Intramuscular   Manufacturer: [place]   Lot: [id number]   [place]: [id number]

## 2019-04-07 ENCOUNTER — Ambulatory Visit: Payer: Medicare HMO | Attending: Internal Medicine

## 2019-04-07 DIAGNOSIS — Z23 Encounter for immunization: Secondary | ICD-10-CM

## 2019-04-07 NOTE — Progress Notes (Signed)
   Covid-19 Vaccination Clinic  Name:  Joel [name]    MRN: [id number] DOB: 03-19-1945  04/07/2019  [name] was observed post Covid-19 immunization for 15 minutes without incident. He was provided with Vaccine Information Sheet and instruction to access the V-Safe system.   [name] was instructed to call 911 with any severe reactions post vaccine: Marland Kitchen Difficulty breathing  . Swelling of face and throat  . A fast heartbeat  . A bad rash all over body  . Dizziness and weakness   Immunizations Administered    Name Date Dose VIS Date Route   Pfizer COVID-19 Vaccine 04/07/2019  2:43 PM 0.3 mL 01/01/2019 Intramuscular   Manufacturer: [place]   Lot: [id number]   [place]: [id number]

## 2019-06-20 ENCOUNTER — Other Ambulatory Visit: Payer: Self-pay | Admitting: Internal Medicine

## 2019-06-20 DIAGNOSIS — I480 Paroxysmal atrial fibrillation: Secondary | ICD-10-CM

## 2019-06-22 NOTE — Telephone Encounter (Signed)
Age 74, weight 85kg, SCr 0.8 on 04/29/18. Afib indication, saw Dr Lovena Le Feb 2021 but no labs were checked.  Pt is on correct dose of Eliquis based on age and weight but will need CBC and BMET checked for annual monitoring. Will call after 8am.

## 2019-06-22 NOTE — Telephone Encounter (Signed)
Called pt, he will come next Monday for BMET and CBC for Eliquis monitoring.

## 2019-06-28 ENCOUNTER — Other Ambulatory Visit: Payer: Self-pay

## 2019-06-28 ENCOUNTER — Other Ambulatory Visit: Payer: Medicare HMO

## 2019-06-28 DIAGNOSIS — I480 Paroxysmal atrial fibrillation: Secondary | ICD-10-CM | POA: Diagnosis not present

## 2019-06-28 LAB — CBC
Hematocrit: 41.6 % (ref 37.5–51.0)
Hemoglobin: 14.4 g/dL (ref 13.0–17.7)
MCH: 34 pg — ABNORMAL HIGH (ref 26.6–33.0)
MCHC: 34.6 g/dL (ref 31.5–35.7)
MCV: 98 fL — ABNORMAL HIGH (ref 79–97)
Platelets: 224 10*3/uL (ref 150–450)
RBC: 4.23 x10E6/uL (ref 4.14–5.80)
RDW: 13.3 % (ref 11.6–15.4)
WBC: 7.9 10*3/uL (ref 3.4–10.8)

## 2019-06-28 LAB — BASIC METABOLIC PANEL
BUN/Creatinine Ratio: 14 (ref 10–24)
BUN: 12 mg/dL (ref 8–27)
CO2: 26 mmol/L (ref 20–29)
Calcium: 9.3 mg/dL (ref 8.6–10.2)
Chloride: 102 mmol/L (ref 96–106)
Creatinine, Ser: 0.87 mg/dL (ref 0.76–1.27)
GFR calc Af Amer: 100 mL/min/{1.73_m2} (ref >59)
GFR calc non Af Amer: 86 mL/min/{1.73_m2} (ref >59)
Glucose: 69 mg/dL (ref 65–99)
Potassium: 5.2 mmol/L (ref 3.5–5.2)
Sodium: 139 mmol/L (ref 134–144)

## 2019-07-12 DIAGNOSIS — E291 Testicular hypofunction: Secondary | ICD-10-CM | POA: Diagnosis not present

## 2019-07-12 DIAGNOSIS — E7849 Other hyperlipidemia: Secondary | ICD-10-CM | POA: Diagnosis not present

## 2019-07-12 DIAGNOSIS — Z0000 Encounter for general adult medical examination without abnormal findings: Secondary | ICD-10-CM | POA: Diagnosis not present

## 2019-07-19 DIAGNOSIS — M546 Pain in thoracic spine: Secondary | ICD-10-CM | POA: Diagnosis not present

## 2019-07-19 DIAGNOSIS — N401 Enlarged prostate with lower urinary tract symptoms: Secondary | ICD-10-CM | POA: Diagnosis not present

## 2019-07-19 DIAGNOSIS — B351 Tinea unguium: Secondary | ICD-10-CM | POA: Diagnosis not present

## 2019-07-19 DIAGNOSIS — D6869 Other thrombophilia: Secondary | ICD-10-CM | POA: Diagnosis not present

## 2019-07-19 DIAGNOSIS — E785 Hyperlipidemia, unspecified: Secondary | ICD-10-CM | POA: Diagnosis not present

## 2019-07-19 DIAGNOSIS — L84 Corns and callosities: Secondary | ICD-10-CM | POA: Diagnosis not present

## 2019-07-19 DIAGNOSIS — Z0000 Encounter for general adult medical examination without abnormal findings: Secondary | ICD-10-CM | POA: Diagnosis not present

## 2019-07-19 DIAGNOSIS — K219 Gastro-esophageal reflux disease without esophagitis: Secondary | ICD-10-CM | POA: Diagnosis not present

## 2019-07-19 DIAGNOSIS — R82998 Other abnormal findings in urine: Secondary | ICD-10-CM | POA: Diagnosis not present

## 2019-07-19 DIAGNOSIS — Z7901 Long term (current) use of anticoagulants: Secondary | ICD-10-CM | POA: Diagnosis not present

## 2019-07-19 DIAGNOSIS — I480 Paroxysmal atrial fibrillation: Secondary | ICD-10-CM | POA: Diagnosis not present

## 2019-07-20 DIAGNOSIS — H5213 Myopia, bilateral: Secondary | ICD-10-CM | POA: Diagnosis not present

## 2019-07-20 DIAGNOSIS — H35372 Puckering of macula, left eye: Secondary | ICD-10-CM | POA: Diagnosis not present

## 2019-07-20 DIAGNOSIS — H2513 Age-related nuclear cataract, bilateral: Secondary | ICD-10-CM | POA: Diagnosis not present

## 2019-07-21 DIAGNOSIS — Z1212 Encounter for screening for malignant neoplasm of rectum: Secondary | ICD-10-CM | POA: Diagnosis not present

## 2019-07-22 DIAGNOSIS — N401 Enlarged prostate with lower urinary tract symptoms: Secondary | ICD-10-CM | POA: Diagnosis not present

## 2019-07-23 DIAGNOSIS — Z0101 Encounter for examination of eyes and vision with abnormal findings: Secondary | ICD-10-CM | POA: Diagnosis not present

## 2019-07-28 ENCOUNTER — Ambulatory Visit: Payer: Medicare HMO | Admitting: Podiatry

## 2019-07-28 ENCOUNTER — Other Ambulatory Visit: Payer: Self-pay

## 2019-07-28 ENCOUNTER — Encounter: Payer: Self-pay | Admitting: Podiatry

## 2019-07-28 DIAGNOSIS — Q828 Other specified congenital malformations of skin: Secondary | ICD-10-CM

## 2019-07-28 DIAGNOSIS — B351 Tinea unguium: Secondary | ICD-10-CM

## 2019-07-28 DIAGNOSIS — D689 Coagulation defect, unspecified: Secondary | ICD-10-CM

## 2019-07-28 NOTE — Progress Notes (Signed)
Subjective:   Patient ID: Joel [name], male   DOB: 74 y.o.   MRN: [id number]   HPI Patient states he developed severe pain underneath his left foot and states it is very tender and makes it hard to walk.  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  I then discussed nails do not recommend aggressive treatment but did discuss possibility for oral pulse therapy along with trimming and patient will be seen back to recheck

## 2019-08-23 DIAGNOSIS — R69 Illness, unspecified: Secondary | ICD-10-CM | POA: Diagnosis not present

## 2019-09-02 ENCOUNTER — Ambulatory Visit: Payer: Medicare HMO | Admitting: Neurology

## 2019-09-02 ENCOUNTER — Encounter: Payer: Self-pay | Admitting: Neurology

## 2019-09-02 VITALS — BP 129/82 | HR 61 | Ht 76.0 in | Wt 180.0 lb

## 2019-09-02 DIAGNOSIS — G459 Transient cerebral ischemic attack, unspecified: Secondary | ICD-10-CM | POA: Diagnosis not present

## 2019-09-02 DIAGNOSIS — R351 Nocturia: Secondary | ICD-10-CM

## 2019-09-02 DIAGNOSIS — I480 Paroxysmal atrial fibrillation: Secondary | ICD-10-CM | POA: Diagnosis not present

## 2019-09-02 NOTE — Progress Notes (Signed)
SLEEP MEDICINE CLINIC    Provider:  Larey Seat, MD  Primary Care Physician:  Crist Infante, MD [place] [address]     Referring Provider: Crist Infante, [place],  [place] [address]          Chief Complaint according to patient   Patient presents with:    . New Patient (Initial Visit)     at physical his PCP talks about he may have potential OSA and wants him to be evaluated. pt denies having concerns of OSA. he never snores sleeps on side. He mentions he wakes up about 3 times to void, but has had a TURP.      HISTORY OF PRESENT ILLNESS:  Joel [name] is a 74 year old married Caucasian male patient of Dr Perini's and seen here on 09/02/2019 for a screening test for OSA.Marland Kitchen  Chief concern according to patient :  "I may snore when I sleep supine after a glass of wine, and I have atrial fibrillation- father , GF and uncle have had this condition.".    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 He  has a past medical history of Allergy, Anxiety, Arthritis, Atrial fibrillation ([place]), Benign prostatic hypertrophy, Dysrhythmia, Family history of anesthesia complication, Gastric polyps - hyperplastic (02/20/2018), GERD (gastroesophageal reflux disease), adenomatous polyp of colon (05/23/2009), Hypercholesterolemia, Memory loss, and Nocturia. He had a TURP procedure.      Sleep relevant medical history: Nocturia 3 times. Atrial fibrillation dx at age 86. History of GERD.  Family medical /sleep history: No other family member on CPAP with OSA, with insomnia, nor sleep walkers.    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  Family status is married with 1 stepson living with the couple. No biological children.    Pets are not present. Tobacco use: quit 1992, 1/2 ppd.   ETOH use: wine or single malt- 1 glass / PM,  Caffeine intake in form of Coffee( /) Soda( /) Tea ( /) or energy drinks. Regular exercise in form of walkig  Hobbies : writing, ballroom dancing.     Sleep habits are as follows: The patient's dinner time is between 7-8 PM. The patient goes to bed at 10 PM and reads in bed- propped up, then continues to sleep for 2-3 hours, wakes for up to 3 bathroom breaks, the first time at 2 AM.   The preferred sleep position is supine or on his side, with the support of 2 pillows. Dreams are reportedly frequent.   6 AM is the usual rise time.  The patient wakes up spontaneously.  He reports not feeling refreshed or restored in AM, with symptoms such as hip pain, stiffness, a dry mouth , rare morning headaches- and some residual fatigue. Naps are taken infrequently, usually when relaxed and reading or watching TV.    Review of Systems: Out of a complete 14 system review, the patient complains of only the following symptoms, and all other reviewed systems are negative.:  some snoring, fragmented sleep, no insomnia.    How likely are you to doze in the following situations: 0 = not likely, 1 = slight chance, 2 = moderate chance, 3 = high chance   Sitting and Reading? Watching Television?  Sitting inactive in a public place (theater or meeting)? As a passenger in a car for an hour without a break? Lying down in the afternoon when circumstances permit? Sitting and talking to someone? Sitting quietly after lunch without alcohol? In a car, while stopped for a few minutes in traffic?   Total = 7/ 24 points   FSS endorsed at na/ 63 points.   Social History   Socioeconomic History  . Marital status: Married    Spouse name: Joel [name]  . Number of children: 0  . Years of education: Not on file  . Highest education level: Not on file  Occupational History  . Occupation: psychologist  Tobacco Use  .  Smoking status: Former Smoker    Packs/day: 1.00    Types: Cigarettes    Quit date: 01/21/1990    Years since quitting: 29.6  . Smokeless tobacco: Never Used  Vaping Use  . Vaping Use: Never used  Substance and Sexual Activity  . Alcohol use: Yes    Alcohol/week: 8.0 standard drinks    Types: 8 Glasses of wine per week    Comment: 2 DRINKS 4-5 TIMES PER WK  . Drug use: No  . Sexual activity: Not on file  Other Topics Concern  . Not on file  Social History Narrative   Step Son is mentally handicapped;   Mother has moved into the home   Social Determinants of Health   Financial Resource Strain:   . Difficulty of Paying Living Expenses:   Food Insecurity:   . Worried About Charity fundraiser in the Last Year:   . Arboriculturist in the Last Year:   Transportation Needs:   . Film/video editor (Medical):   Marland Kitchen Lack of Transportation (Non-Medical):   Physical Activity:   . Days of Exercise per Week:   . Minutes of Exercise per Session:   Stress:   . Feeling of Stress :   Social Connections:   . Frequency of Communication with Friends and Family:   . Frequency of Social Gatherings with Friends and Family:   . Attends Religious Services:   . Active Member of Clubs or Organizations:   . Attends Archivist Meetings:   Marland Kitchen Marital Status:     Family History  Problem Relation Age of Onset  . Hypertension Mother   . Hyperlipidemia Mother   . GER disease Mother   . Arrhythmia Father   . Stroke Father        36s  . Hypertension Sister   . GER disease Sister   . Other Sister        tumor removed from her colon, possible pre-cancerous, foot of her bowel removed  . Colon cancer Sister   . Esophageal cancer Other   . Stomach cancer Other   . Rectal cancer Other     Past Medical History:  Diagnosis Date  . Allergy   . Anxiety   . Arthritis   . Atrial fibrillation ([place])   . Benign prostatic hypertrophy   . Dysrhythmia   . Family history of anesthesia  complication    "MOTHER DEVELOPED DEMENTIA AFTER SURGERY"  . Gastric polyps - hyperplastic 02/20/2018  . GERD (gastroesophageal reflux disease)    INTERMITANT -NO MEDS  . Hx of adenomatous polyp of colon 05/23/2009  . Hypercholesterolemia   . Memory loss   . Nocturia     Past Surgical History:  Procedure Laterality Date  . APPENDECTOMY    .  COLONOSCOPY W/ POLYPECTOMY    . COLOSTOMY    . FISTULOTOMY N/A 08/12/2018   Procedure: FISTULOTOMY;  Surgeon: Leighton Ruff, MD;  Location: [facility];  Service: General;  Laterality: N/A;  . INCISION AND DRAINAGE ABSCESS N/A 03/25/2018   Procedure: INCISION AND DRAINAGE PERI-RECTAL ABSCESS;  Surgeon: Donnie Mesa, MD;  Location: [facility];  Service: General;  Laterality: N/A;  . PLATE NECK  [id number]   TITANIUM PLATE IN NECK  . RECTAL EXAM UNDER ANESTHESIA N/A 08/12/2018   Procedure: ANAL EXAM UNDER ANESTHESIA;  Surgeon: Leighton Ruff, MD;  [facility];  Service: General;  Laterality: N/A;  . TRANSURETHRAL RESECTION OF PROSTATE N/A 08/06/2013   Procedure: TRANSURETHRAL RESECTION OF THE PROSTATE (TURP) WITH GYRUS, irrigation of bladder stone, cystoscope;  Surgeon: Ailene Rud, MD;  Location[facility];  Service: Urology;  Laterality: N/A;     Current Outpatient Medications on File Prior to Visit  Medication Sig Dispense Refill  . acetaminophen (TYLENOL) 325 MG tablet Take 325 mg by mouth every 6 (six) hours as needed for fever (or for pain or headaches).    Marland Kitchen atorvastatin (LIPITOR) 10 MG tablet Take 1 tablet (10 mg total) by mouth daily. 90 tablet 3  . cetirizine (ZYRTEC) 10 MG tablet Take 5 mg by mouth daily as needed (for seasonal allergies).     . Coenzyme Q10 (CO Q 10 PO) Take 1 capsule by mouth at bedtime.     Marland Kitchen ELIQUIS 5 MG TABS tablet TAKE 1 TABLET BY MOUTH TWICE A DAY 180 tablet 1  . flecainide (TAMBOCOR) 100 MG tablet TAKE 1 TABLET BY MOUTH TWICE DAILY. ALSO TAKE 1/2 TABLET BY MOUTH AS NEEDED FOR AFIB.  225 tablet 3  . Glucosamine HCl (GLUCOSAMINE PO) Take 1 tablet by mouth daily.    . metoprolol succinate (TOPROL-XL) 25 MG 24 hr tablet TAKE 1/2 TABLET BY MOUTH EVERY DAY 45 tablet 2  . Multiple Vitamin (MULTIVITAMIN) capsule Take 1 capsule by mouth daily.    . valACYclovir (VALTREX) 500 MG tablet Take 500 mg by mouth as needed for other.     No current facility-administered medications on file prior to visit.    Allergies  Allergen Reactions  . Ciprofloxacin Other (See Comments)    Is currently taking Tambocor.Marland KitchenMarland KitchenINTERACTION??  . Penicillins Hives and Rash    Did it involve swelling of the face/tongue/throat, SOB, or low BP? Yes Did it involve sudden or severe rash/hives, skin peeling, or any reaction on the inside of your mouth or nose? Unk Did you need to seek medical attention at a hospital or doctor's office? Yes When did it last happen?1970's If all above answers are "NO", may proceed with cephalosporin use.     Physical exam:  There were no vitals filed for this visit. There is no height or weight on file to calculate BMI.   Wt Readings from Last 3 Encounters:  02/25/19 187 lb 12.8 oz (85.2 kg)  09/08/18 181 lb (82.1 kg)  08/12/18 179 lb 11.2 oz (81.5 kg)     Ht Readings from Last 3 Encounters:  02/25/19 6\' 4"  (1.93 m)  09/08/18 6\' 4"  (1.93 m)  08/12/18 6\' 4"  (1.93 m)      General: The patient is awake, alert and appears not in acute distress. The patient is well groomed. Head: Normocephalic, atraumatic. Neck is supple. Mallampati 2- not plus ,  neck circumference: 16 inches . Nasal airflow patent.  Retrognathia is not seen, ut  a crowded lower jaw. .  Dental status: crowded.  Cardiovascular:  Regular rate and cardiac rhythm by pulse,  without distended neck veins. Respiratory: Lungs are clear to auscultation.  Skin:  Without evidence of ankle edema, or rash. Trunk: The patient's posture is erect.   Neurologic exam : The patient is awake and alert, oriented to  place and time.   Memory subjective described as intact.  Attention span & concentration ability appears normal.  Speech is fluent,  without  dysarthria, dysphonia or aphasia.  Mood and affect are appropriate.   Cranial nerves: no loss of smell or taste reported  Pupils are equal and briskly reactive to light. Funduscopic exam deferred. Dry eyes, left eye had corneal irritation.  Extraocular movements in vertical and horizontal planes were intact -and without nystagmus. No Diplopia. Visual fields by finger perimetry are intact. Hearing was intact to soft voice .   Facial sensation intact to fine touch.  Facial motor strength is symmetric and tongue and uvula move midline.  Neck ROM : rotation, tilt and flexion extension were normal for age and shoulder shrug was symmetrical.    Motor exam: Symmetric bulk, tone and ROM.   Normal tone without cog- wheeling, symmetric grip strength .   Sensory:  Fine touch and vibration were  normal.  Proprioception tested in the upper extremities was normal.   Coordination: Rapid alternating movements in the fingers/hands were of normal speed.  The Finger-to-nose maneuver was intact without evidence of ataxia, dysmetria or tremor.   Gait and station: Patient could rise unassisted from a seated position, walked without assistive device.  Stance is of normal width/ base and the patient turned with 3 steps.  Toe and heel walk were deferred.  Deep tendon reflexes: in the upper and lower extremities are symmetric and intact.  Babinski response was deferred .       After spending a total time of  40 minutes face to face and additional time for physical and neurologic examination, review of laboratory studies,  personal review of imaging studies, reports and results of other testing and review of referral information / records as far as provided in visit, I have established the following assessments:  1) Mr. Bralley has a familiar trait of atrial  fibrillation, and his paternal family members all have been taller then 6 foot, but there is not history of Ehlers Danlos syndrome.   2) he has no history of witnessed apneas, no excessive daytime sleepiness but a crowded lower jaw.     My Plan is to proceed with:  1) We will screen Mr. Fleury for the presence of OSA.  This can be done by HST or PSG    I would like to thank Crist Infante, MD and Crist Infante, [address] [place],   [id number] for allowing me to meet with and to take care of this pleasant patient.   In short, Darryll C Laflamme is presenting with nocturia and some snoring, history of atrial fib.   I plan to follow up either personally or through our NP within 2-3  month.    Electronically signed by: Larey Seat, MD 09/02/2019 9:35 AM  [facility] Board certified by The [employer] of Sleep Medicine and Diplomate of the [employer] of Sleep Medicine. Board certified In Neurology through the [place], Fellow of the [employer] of Neurology. Medical Director of [facility].

## 2019-09-02 NOTE — Patient Instructions (Signed)

## 2019-09-02 NOTE — Addendum Note (Signed)
Addended by: Larey Seat on: 09/02/2019 10:44 AM   Modules accepted: Orders

## 2019-09-08 ENCOUNTER — Telehealth: Payer: Self-pay

## 2019-09-08 NOTE — Telephone Encounter (Signed)
Called patient to schedule sleep study. He stated he is no longer interested in scheduling at this time.

## 2019-09-19 ENCOUNTER — Other Ambulatory Visit: Payer: Self-pay | Admitting: Internal Medicine

## 2019-09-19 DIAGNOSIS — I480 Paroxysmal atrial fibrillation: Secondary | ICD-10-CM

## 2019-09-20 DIAGNOSIS — Z20822 Contact with and (suspected) exposure to covid-19: Secondary | ICD-10-CM | POA: Diagnosis not present

## 2019-10-08 DIAGNOSIS — H35372 Puckering of macula, left eye: Secondary | ICD-10-CM | POA: Diagnosis not present

## 2019-10-25 DIAGNOSIS — H35372 Puckering of macula, left eye: Secondary | ICD-10-CM | POA: Diagnosis not present

## 2019-10-25 DIAGNOSIS — H33321 Round hole, right eye: Secondary | ICD-10-CM | POA: Diagnosis not present

## 2019-10-26 ENCOUNTER — Encounter (INDEPENDENT_AMBULATORY_CARE_PROVIDER_SITE_OTHER): Payer: Medicare HMO | Admitting: Ophthalmology

## 2019-10-26 ENCOUNTER — Other Ambulatory Visit: Payer: Self-pay

## 2019-10-26 DIAGNOSIS — H35372 Puckering of macula, left eye: Secondary | ICD-10-CM | POA: Diagnosis not present

## 2019-10-26 DIAGNOSIS — H43813 Vitreous degeneration, bilateral: Secondary | ICD-10-CM | POA: Diagnosis not present

## 2019-10-26 DIAGNOSIS — H33021 Retinal detachment with multiple breaks, right eye: Secondary | ICD-10-CM

## 2019-10-26 DIAGNOSIS — R69 Illness, unspecified: Secondary | ICD-10-CM | POA: Diagnosis not present

## 2019-11-02 ENCOUNTER — Encounter (INDEPENDENT_AMBULATORY_CARE_PROVIDER_SITE_OTHER): Payer: Medicare HMO | Admitting: Ophthalmology

## 2019-11-02 ENCOUNTER — Other Ambulatory Visit: Payer: Self-pay

## 2019-11-02 DIAGNOSIS — H33301 Unspecified retinal break, right eye: Secondary | ICD-10-CM

## 2019-11-12 ENCOUNTER — Encounter: Payer: Self-pay | Admitting: Internal Medicine

## 2019-11-12 ENCOUNTER — Other Ambulatory Visit: Payer: Self-pay

## 2019-11-12 ENCOUNTER — Ambulatory Visit (INDEPENDENT_AMBULATORY_CARE_PROVIDER_SITE_OTHER): Payer: Medicare HMO | Admitting: Internal Medicine

## 2019-11-12 VITALS — BP 134/80 | HR 62 | Ht 76.0 in | Wt 182.4 lb

## 2019-11-12 DIAGNOSIS — I480 Paroxysmal atrial fibrillation: Secondary | ICD-10-CM

## 2019-11-12 DIAGNOSIS — I495 Sick sinus syndrome: Secondary | ICD-10-CM

## 2019-11-12 NOTE — Patient Instructions (Signed)

## 2019-11-12 NOTE — Progress Notes (Signed)
HPI [name] returns today for ongoing followup of sinus noded dysfunction, PAF, and dizziness. He has noted some bradycardia and dizziness. He notes that after stopping the toprol, his HR has improved but his dizziness did not change. He is able to walk with his wife  Allergies  Allergen Reactions  . Ciprofloxacin Other (See Comments)    Is currently taking Tambocor.Marland KitchenMarland KitchenINTERACTION??  . Penicillins Hives and Rash    Did it involve swelling of the face/tongue/throat, SOB, or low BP? Yes Did it involve sudden or severe rash/hives, skin peeling, or any reaction on the inside of your mouth or nose? Unk Did you need to seek medical attention at a hospital or doctor's office? Yes When did it last happen?1970's If all above answers are "NO", may proceed with cephalosporin use.      Current Outpatient Medications  Medication Sig Dispense Refill  . acetaminophen (TYLENOL) 325 MG tablet Take 325 mg by mouth every 6 (six) hours as needed for fever (or for pain or headaches).    Marland Kitchen atorvastatin (LIPITOR) 10 MG tablet Take 1 tablet (10 mg total) by mouth daily. 90 tablet 3  . cetirizine (ZYRTEC) 10 MG tablet Take 5 mg by mouth daily as needed (for seasonal allergies).     . Coenzyme Q10 (CO Q 10 PO) Take 1 capsule by mouth at bedtime.     Marland Kitchen ELIQUIS 5 MG TABS tablet TAKE 1 TABLET BY MOUTH TWICE A DAY 180 tablet 1  . flecainide (TAMBOCOR) 100 MG tablet TAKE 1 TAB BY MOUTH TWICE A DAY ALSO TAKE 1/2 TAB AS NEEDED FOR AFIB 225 tablet 1  . Glucosamine HCl (GLUCOSAMINE PO) Take 1 tablet by mouth daily.    . Multiple Vitamin (MULTIVITAMIN) capsule Take 1 capsule by mouth daily.    . valACYclovir (VALTREX) 500 MG tablet Take 500 mg by mouth as needed for other.     No current facility-administered medications for this visit.     Past Medical History:  Diagnosis Date  . Allergy   . Anxiety   . Arthritis   . Atrial fibrillation ([place])   . Benign prostatic hypertrophy   . Dysrhythmia   .  Family history of anesthesia complication    "MOTHER DEVELOPED DEMENTIA AFTER SURGERY"  . Gastric polyps - hyperplastic 02/20/2018  . GERD (gastroesophageal reflux disease)    INTERMITANT -NO MEDS  . Hx of adenomatous polyp of colon 05/23/2009  . Hypercholesterolemia   . Memory loss   . Nocturia     ROS:   All systems reviewed and negative except as noted in the HPI.   Past Surgical History:  Procedure Laterality Date  . APPENDECTOMY    . COLONOSCOPY W/ POLYPECTOMY    . COLOSTOMY    . FISTULOTOMY N/A 08/12/2018   Procedure: FISTULOTOMY;  Surgeon: Leighton Ruff, MD;  Location: [facility];  Service: General;  Laterality: N/A;  . INCISION AND DRAINAGE ABSCESS N/A 03/25/2018   Procedure: INCISION AND DRAINAGE PERI-RECTAL ABSCESS;  Surgeon: Donnie Mesa, MD;  Location: [place];  Service: General;  Laterality: N/A;  . PLATE NECK  4696   TITANIUM PLATE IN NECK  . RECTAL EXAM UNDER ANESTHESIA N/A 08/12/2018   Procedure: ANAL EXAM UNDER ANESTHESIA;  Surgeon: Leighton Ruff, MD;  Location: [facility];  Service: General;  Laterality: N/A;  . TRANSURETHRAL RESECTION OF PROSTATE N/A 08/06/2013   Procedure: TRANSURETHRAL RESECTION OF THE PROSTATE (TURP) WITH GYRUS, irrigation of bladder stone,  cystoscope;  Surgeon: Ailene Rud, MD;  Location: [facility];  Service: Urology;  Laterality: N/A;     Family History  Problem Relation Age of Onset  . Hypertension Mother   . Hyperlipidemia Mother   . GER disease Mother   . Arrhythmia Father   . Stroke Father        55s  . Hypertension Sister   . GER disease Sister   . Other Sister        tumor removed from her colon, possible pre-cancerous, foot of her bowel removed  . Colon cancer Sister   . Esophageal cancer Other   . Stomach cancer Other   . Rectal cancer Other      Social History   Socioeconomic History  . Marital status: Married    Spouse name: Joel [name]  . Number of children: 0  . Years of education:  Not on file  . Highest education level: Not on file  Occupational History  . Occupation: psychologist  Tobacco Use  . Smoking status: Former Smoker    Packs/day: 1.00    Types: Cigarettes    Quit date: 01/21/1990    Years since quitting: 29.8  . Smokeless tobacco: Never Used  Vaping Use  . Vaping Use: Never used  Substance and Sexual Activity  . Alcohol use: Yes    Alcohol/week: 8.0 standard drinks    Types: 8 Glasses of wine per week    Comment: 2 DRINKS 4-5 TIMES PER WK  . Drug use: No  . Sexual activity: Not on file  Other Topics Concern  . Not on file  Social History Narrative   Step Son is mentally handicapped;   Mother has moved into the home   Social Determinants of Health   Financial Resource Strain:   . Difficulty of Paying Living Expenses: Not on file  Food Insecurity:   . Worried About Charity fundraiser in the Last Year: Not on file  . Ran Out of Food in the Last Year: Not on file  Transportation Needs:   . Lack of Transportation (Medical): Not on file  . Lack of Transportation (Non-Medical): Not on file  Physical Activity:   . Days of Exercise per Week: Not on file  . Minutes of Exercise per Session: Not on file  Stress:   . Feeling of Stress : Not on file  Social Connections:   . Frequency of Communication with Friends and Family: Not on file  . Frequency of Social Gatherings with Friends and Family: Not on file  . Attends Religious Services: Not on file  . Active Member of Clubs or Organizations: Not on file  . Attends Archivist Meetings: Not on file  . Marital Status: Not on file  Intimate Partner Violence:   . Fear of Current or Ex-Partner: Not on file  . Emotionally Abused: Not on file  . Physically Abused: Not on file  . Sexually Abused: Not on file     There were no vitals taken for this visit.  Physical Exam:  Well appearing NAD HEENT: Unremarkable Neck:  No JVD, no thyromegally Lymphatics:  No adenopathy Back:  No CVA  tenderness Lungs:  Clear HEART:  Regular rate rhythm, no murmurs, no rubs, no clicks Abd:  soft, positive bowel sounds, no organomegally, no rebound, no guarding Ext:  2 plus pulses, no edema, no cyanosis, no clubbing Skin:  No rashes no nodules Neuro:  CN II through XII intact, motor grossly intact  EKG  DEVICE  Normal device function.  See PaceArt for details.   Assess/Plan: 1. PAF - he appears to be maintaining NSR on flecainide. Continue. 2. Sinus node dysfunction - he is asymptomatic 3. HTN - His bp is well controlled 4. Coags - he will continue his eliquis.  Royston Sinner Dontavion Noxon,MD

## 2019-12-15 ENCOUNTER — Other Ambulatory Visit: Payer: Self-pay | Admitting: Internal Medicine

## 2019-12-15 NOTE — Telephone Encounter (Signed)
Age 74, weight 83kg, SCr 0.87 on 06/2019 Last visit Oct 2021, afib indication

## 2020-02-01 ENCOUNTER — Other Ambulatory Visit: Payer: Self-pay | Admitting: Internal Medicine

## 2020-02-01 DIAGNOSIS — I480 Paroxysmal atrial fibrillation: Secondary | ICD-10-CM

## 2020-02-02 ENCOUNTER — Telehealth: Payer: Self-pay | Admitting: Internal Medicine

## 2020-02-02 MED ORDER — METOPROLOL SUCCINATE ER 25 MG PO TB24
12.5000 mg | ORAL_TABLET | Freq: Every day | ORAL | 3 refills | Status: DC
Start: 2020-02-02 — End: 2021-02-06

## 2020-02-02 NOTE — Telephone Encounter (Signed)
Patient reports he is having difficulty with GERD, gas and belching after a meal.  He reports that this triggers his a-fib.  Prilosec is not controlling his symptoms, he is asking about resuming pantoprazole. He is advised needs an ov to further discuss.  He will come on on Friday at 2:30 with Alonza Bogus, PA

## 2020-02-04 ENCOUNTER — Telehealth: Payer: Self-pay

## 2020-02-04 ENCOUNTER — Encounter: Payer: Self-pay | Admitting: Gastroenterology

## 2020-02-04 ENCOUNTER — Ambulatory Visit: Payer: Medicare HMO | Admitting: Gastroenterology

## 2020-02-04 VITALS — BP 106/64 | HR 56 | Ht 74.5 in | Wt 183.4 lb

## 2020-02-04 DIAGNOSIS — Z86010 Personal history of colonic polyps: Secondary | ICD-10-CM

## 2020-02-04 DIAGNOSIS — K219 Gastro-esophageal reflux disease without esophagitis: Secondary | ICD-10-CM

## 2020-02-04 DIAGNOSIS — K317 Polyp of stomach and duodenum: Secondary | ICD-10-CM

## 2020-02-04 DIAGNOSIS — R142 Eructation: Secondary | ICD-10-CM | POA: Diagnosis not present

## 2020-02-04 DIAGNOSIS — Z7901 Long term (current) use of anticoagulants: Secondary | ICD-10-CM

## 2020-02-04 MED ORDER — PANTOPRAZOLE SODIUM 40 MG PO TBEC: 40.0000 mg | DELAYED_RELEASE_TABLET | Freq: Every day | ORAL | 4 refills | Status: DC

## 2020-02-04 NOTE — Patient Instructions (Signed)
If you are age 73 or older, your body mass index should be between 23-30. Your Body mass index is 23.23 kg/m. If this is out of the aforementioned range listed, please consider follow up with your Primary Care Provider.  If you are age 61 or younger, your body mass index should be between 19-25. Your Body mass index is 23.23 kg/m. If this is out of the aformentioned range listed, please consider follow up with your Primary Care Provider.   You have been scheduled for an endoscopy. Please follow written instructions given to you at your visit today. If you use inhalers (even only as needed), please bring them with you on the day of your procedure.  You will be contacted by our office prior to your procedure for directions on holding your ELIQUIS.  If you do not hear from our office 1 week prior to your scheduled procedure, please call [phone] to discuss.   START Pantoprazole 40 mg daily. This has been sent to your pharmacy.  Follow up pending the results of your Endoscopy/ Colonoscopy or as needed.  Thank you for entrusting me with your care and choosing [facility]  Alonza Bogus, PA-C

## 2020-02-04 NOTE — Progress Notes (Signed)
02/04/2020 Joel [name] [id number] 12/13/1945   HISTORY OF PRESENT ILLNESS: This is a pleasant 75 year old male who is a patient of Dr. Celesta Aver.  He is here today with complaints of belching, heartburn, and reflux sensation.  He had previously been on pantoprazole 40 mg daily for some time.  He was feeling good so he came off of it.  Recently he feels that he has been having some burning and heartburn sensation as well as a lot of belching after eating.  He feels that this is all affecting his atrial fibrillation and feels that it causes him to go into arrhythmia.  He has tried some courses of over-the-counter omeprazole without much improvement.  Denies food getting stuck, no abdominal pain.  EGD January 2020 showed multiple gastric polyps (hyperplastic) and was otherwise normal.  Colonoscopy January 2017 at which time he had 2 polyps removed that were tubular adenomas.  He is on Eliquis for atrial fibrillation that is controlled but his cardiologist, Dr. Lovena Le.   Past Medical History:  Diagnosis Date  . Allergy   . Anxiety   . Arthritis   . Atrial fibrillation ([place])   . Benign prostatic hypertrophy   . Dysrhythmia   . Family history of anesthesia complication    "MOTHER DEVELOPED DEMENTIA AFTER SURGERY"  . Gastric polyps - hyperplastic 02/20/2018  . GERD (gastroesophageal reflux disease)    INTERMITANT -NO MEDS  . Hx of adenomatous polyp of colon 05/23/2009  . Hypercholesterolemia   . Memory loss   . Nocturia    Past Surgical History:  Procedure Laterality Date  . APPENDECTOMY    . COLONOSCOPY W/ POLYPECTOMY    . COLOSTOMY    . FISTULOTOMY N/A 08/12/2018   Procedure: FISTULOTOMY;  Surgeon: Leighton Ruff, MD;  [facility];  Service: General;  Laterality: N/A;  . INCISION AND DRAINAGE ABSCESS N/A 03/25/2018   Procedure: INCISION AND DRAINAGE PERI-RECTAL ABSCESS;  Surgeon: Donnie Mesa, MD;  Location: [place];  Service: General;  Laterality:  N/A;  . PLATE NECK  4235   TITANIUM PLATE IN NECK  . RECTAL EXAM UNDER ANESTHESIA N/A 08/12/2018   Procedure: ANAL EXAM UNDER ANESTHESIA;  Surgeon: Leighton Ruff, MD;  Location: [facility];  Service: General;  Laterality: N/A;  . TRANSURETHRAL RESECTION OF PROSTATE N/A 08/06/2013   Procedure: TRANSURETHRAL RESECTION OF THE PROSTATE (TURP) WITH GYRUS, irrigation of bladder stone, cystoscope;  Surgeon: Ailene Rud, MD;  Location: [facility];  Service: Urology;  Laterality: N/A;    reports that he quit smoking about 30 years ago. His smoking use included cigarettes. He smoked 1.00 pack per day. He has never used smokeless tobacco. He reports current alcohol use of about 8.0 standard drinks of alcohol per week. He reports that he does not use drugs. family history includes Arrhythmia in his father; Colon cancer in his sister; Esophageal cancer in an other family member; GER disease in his mother and sister; Hyperlipidemia in his mother; Hypertension in his mother and sister; Other in his sister; Rectal cancer in an other family member; Stomach cancer in an other family member; Stroke in his father. Allergies  Allergen Reactions  . Ciprofloxacin Other (See Comments)    Is currently taking Tambocor.Marland KitchenMarland KitchenINTERACTION??  . Penicillins Hives and Rash    Did it involve swelling of the face/tongue/throat, SOB, or low BP? Yes Did it involve sudden or severe rash/hives, skin peeling, or any reaction on the inside of your mouth or  nose? Unk Did you need to seek medical attention at a hospital or doctor's office? Yes When did it last happen?1970's If all above answers are "NO", may proceed with cephalosporin use.       Outpatient Encounter Medications as of 02/04/2020  Medication Sig  . acetaminophen (TYLENOL) 325 MG tablet Take 325 mg by mouth every 6 (six) hours as needed for fever (or for pain or headaches).  Marland Kitchen atorvastatin (LIPITOR) 10 MG tablet Take 1 tablet (10 mg total) by mouth  daily.  . cetirizine (ZYRTEC) 10 MG tablet Take 5 mg by mouth daily as needed (for seasonal allergies).   . Coenzyme Q10 (CO Q 10 PO) Take 1 capsule by mouth at bedtime.   Marland Kitchen ELIQUIS 5 MG TABS tablet TAKE 1 TABLET BY MOUTH TWICE A DAY  . flecainide (TAMBOCOR) 100 MG tablet TAKE 1 TAB BY MOUTH TWICE A DAY ALSO TAKE 1/2 TAB AS NEEDED FOR AFIB  . Glucosamine HCl (GLUCOSAMINE PO) Take 1 tablet by mouth daily.  . metoprolol succinate (TOPROL XL) 25 MG 24 hr tablet Take 0.5 tablets (12.5 mg total) by mouth daily.  . Multiple Vitamin (MULTIVITAMIN) capsule Take 1 capsule by mouth daily.  . valACYclovir (VALTREX) 500 MG tablet Take 500 mg by mouth as needed for other.   No facility-administered encounter medications on file as of 02/04/2020.     REVIEW OF SYSTEMS  : All other systems reviewed and negative except where noted in the History of Present Illness.   PHYSICAL EXAM: BP 106/64 (BP Location: Left Arm, Patient Position: Sitting, Cuff Size: Normal)   Pulse (!) 56   Ht 6' 2.5" (1.892 m) Comment: height measured without shoes  Wt 183 lb 6 oz (83.2 kg)   BMI 23.23 kg/m  General: Well developed white male in no acute distress Head: Normocephalic and atraumatic Eyes:  Sclerae anicteric, conjunctiva pink. Ears: Normal auditory acuity Lungs: Clear throughout to auscultation; no W/R/R. Heart: Slightly bradycardic; no M/R/G. Abdomen: Soft, non-distended.  BS present.  Non-tender. Rectal:  Will be done at the time of colonoscopy. Musculoskeletal: Symmetrical with no gross deformities  Skin: No lesions on visible extremities Extremities: No edema  Neurological: Alert oriented x 4, grossly non-focal Psychological:  Alert and cooperative. Normal mood and affect  ASSESSMENT AND PLAN: *Personal history of adenomatous colon polyp: Last colonoscopy January 2017.  We will plan for colonoscopy with Dr. Carlean Purl. *History of hyperplastic gastric polyps, GERD, belching: We will place him back on  pantoprazole 40 mg daily.  Prescription sent to the pharmacy.  We will plan for EGD with Dr. Carlean Purl as well at his recommendation. *Chronic anticoagulation with Eliquis due to atrial fibrillation:  Will hold Eliquis for 2 days prior to endoscopic procedures - will instruct when and how to resume after procedure. Benefits and risks of procedure explained including risks of bleeding, perforation, infection, missed lesions, reactions to medications and possible need for hospitalization and surgery for complications. Additional rare but real risk of stroke or other vascular clotting events off of Eliquis also explained and need to seek urgent help if any signs of these problems occur. Will communicate by phone or EMR with patient's prescribing provider, Dr. Lovena Le, to confirm that holding Eliquis is reasonable in this case.    CC:  Crist Infante, MD

## 2020-02-04 NOTE — Telephone Encounter (Signed)
[facility] Pre-operative Risk Assessment     Request for surgical clearance:     Endoscopy Procedure  What type of surgery is being performed?     Endoscopy/Colonoscopy  When is this surgery scheduled?     03/10/2020  What type of clearance is required ?   Pharmacy  Are there any medications that need to be held prior to surgery and how long? Eliquis starting 48 hours prior  Practice name and name of physician performing surgery?      [facility]  What is your office phone and fax number?      Phone- [phone]  [phone]  Anesthesia type (None, local, MAC, general) ?       MAC

## 2020-02-07 NOTE — Telephone Encounter (Signed)
Patient with diagnosis of atrial fibrillation on Eliquis for anticoagulation.    Procedure: endoscopy/colonoscopy Date of procedure: 03/10/20    CHA2DS2-VASc Score = 4  This indicates a 4.8% annual risk of stroke. The patient's score is based upon: CHF History: No HTN History: Yes Diabetes History: No Stroke History: Yes Vascular Disease History: No Age Score: 1 Gender Score: 0   CrCl 90.3 Platelet count 224  Patient has history of multiple TIA in past per Dr. Lovena Le note (Feb 2016).  Nothing noted after that time.  Would agree that it is okay to hold Eliquis for 2 days prior to procedure (GI ask, pt has history of polyps) and start back ASAP, but will defer to Dr. Lovena Le for assessment.

## 2020-02-08 NOTE — Telephone Encounter (Signed)
Ok to hold eliquis

## 2020-02-10 NOTE — Telephone Encounter (Signed)
   Primary Cardiologist: Cristopher Peru, MD  Chart reviewed as part of pre-operative protocol coverage. Pharmacy clearance only requested.  [name] has a history of atrial fibrillation and is on eliquis for anticoagulation.   Per pharmacy review, office protocols, and review by his primary cardiologist - he may hold Eliquis 2 days prior to the planned procedure and resume as soon as possible at direction of the gastroenterologist.   Left detailed VM with these instructions on patients cell phone per DPR.   I will route this recommendation to the requesting party via Epic fax function and remove from pre-op pool.  Please call with questions.  Loel Dubonnet, NP 02/10/2020, 9:27 AM

## 2020-02-14 NOTE — Telephone Encounter (Signed)
Patient has been advised to stop his Eliquis on 03/08/2020 and that he should told when to restart after his procedure.

## 2020-03-06 ENCOUNTER — Encounter: Payer: Self-pay | Admitting: Internal Medicine

## 2020-03-07 ENCOUNTER — Other Ambulatory Visit: Payer: Self-pay

## 2020-03-07 ENCOUNTER — Encounter (INDEPENDENT_AMBULATORY_CARE_PROVIDER_SITE_OTHER): Payer: Medicare HMO | Admitting: Ophthalmology

## 2020-03-07 DIAGNOSIS — H33301 Unspecified retinal break, right eye: Secondary | ICD-10-CM | POA: Diagnosis not present

## 2020-03-07 DIAGNOSIS — H43813 Vitreous degeneration, bilateral: Secondary | ICD-10-CM

## 2020-03-10 ENCOUNTER — Other Ambulatory Visit: Payer: Self-pay

## 2020-03-10 ENCOUNTER — Encounter: Payer: Self-pay | Admitting: Internal Medicine

## 2020-03-10 ENCOUNTER — Ambulatory Visit (AMBULATORY_SURGERY_CENTER): Payer: Medicare HMO | Admitting: Internal Medicine

## 2020-03-10 VITALS — BP 112/63 | HR 58 | Temp 97.5°F | Resp 15 | Ht 74.0 in | Wt 183.0 lb

## 2020-03-10 DIAGNOSIS — D124 Benign neoplasm of descending colon: Secondary | ICD-10-CM | POA: Diagnosis not present

## 2020-03-10 DIAGNOSIS — Z86010 Personal history of colonic polyps: Secondary | ICD-10-CM | POA: Diagnosis not present

## 2020-03-10 DIAGNOSIS — K317 Polyp of stomach and duodenum: Secondary | ICD-10-CM

## 2020-03-10 DIAGNOSIS — D123 Benign neoplasm of transverse colon: Secondary | ICD-10-CM | POA: Diagnosis not present

## 2020-03-10 DIAGNOSIS — K219 Gastro-esophageal reflux disease without esophagitis: Secondary | ICD-10-CM | POA: Diagnosis not present

## 2020-03-10 MED ORDER — SODIUM CHLORIDE 0.9 % IV SOLN: 500.0000 mL | Freq: Once | INTRAVENOUS | Status: DC

## 2020-03-10 NOTE — Progress Notes (Signed)
Cw vitals and SH IV 

## 2020-03-10 NOTE — Progress Notes (Signed)
Report given to PACU, vss 

## 2020-03-10 NOTE — Op Note (Signed)
[facility] Patient Name: Joel [name] Procedure Date: 03/10/2020 2:25 PM MRN: [id number] Endoscopist: Gatha Mayer , MD Age: 75 Referring MD:  Date of Birth: Apr 06, 1945 Gender: Male Account #: [id number] Procedure:                Colonoscopy Indications:              Surveillance: Personal history of adenomatous                            polyps on last colonoscopy 5 years ago Medicines:                Propofol per Anesthesia, Monitored Anesthesia Care Procedure:                Pre-Anesthesia Assessment:                           - Prior to the procedure, a History and Physical                            was performed, and patient medications and                            allergies were reviewed. The patient's tolerance of                            previous anesthesia was also reviewed. The risks                            and benefits of the procedure and the sedation                            options and risks were discussed with the patient.                            All questions were answered, and informed consent                            was obtained. Prior Anticoagulants: The patient                            last took Eliquis (apixaban) 2 days prior to the                            procedure. ASA Grade Assessment: II - A patient                            with mild systemic disease. After reviewing the                            risks and benefits, the patient was deemed in                            satisfactory condition to undergo the procedure.  After obtaining informed consent, the colonoscope                            was passed under direct vision. Throughout the                            procedure, the patient's blood pressure, pulse, and                            oxygen saturations were monitored continuously. The                            Olympus CF-HQ190L ([id number]) Colonoscope was                             introduced through the anus and advanced to the the                            cecum, identified by appendiceal orifice and                            ileocecal valve. The colonoscopy was performed                            without difficulty. The patient tolerated the                            procedure well. The quality of the bowel                            preparation was good. The ileocecal valve,                            appendiceal orifice, and rectum were photographed.                            The bowel preparation used was Miralax via split                            dose instruction. Scope In: 2:36:28 PM Scope Out: 2:57:50 PM Scope Withdrawal Time: 0 hours 14 minutes 24 seconds  Total Procedure Duration: 0 hours 21 minutes 22 seconds  Findings:                 The perianal and digital rectal examinations were                            normal. Pertinent negatives include normal prostate                            (size, shape, and consistency).                           Two sessile polyps were found in the descending  colon and transverse colon. The polyps were                            diminutive in size. These polyps were removed with                            a cold snare. Resection and retrieval were                            complete. Verification of patient identification                            for the specimen was done. Estimated blood loss was                            minimal.                           Multiple small-mouthed diverticula were found in                            the sigmoid colon.                           The exam was otherwise without abnormality on                            direct and retroflexion views. Complications:            No immediate complications. Estimated Blood Loss:     Estimated blood loss was minimal. Impression:               - Two diminutive polyps in the descending colon and                             in the transverse colon, removed with a cold snare.                            Resected and retrieved.                           - Diverticulosis in the sigmoid colon.                           - The examination was otherwise normal on direct                            and retroflexion views.                           - Personal history of colonic polyps. 2 adenomas                            2017 Recommendation:           - Patient has a contact number available for  emergencies. The signs and symptoms of potential                            delayed complications were discussed with the                            patient. Return to normal activities tomorrow.                            Written discharge instructions were provided to the                            patient.                           - Resume previous diet.                           - Continue present medications.                           - Await pathology results.                           - Repeat colonoscopy is recommended for                            surveillance. The colonoscopy date will be                            determined after pathology results from today's                            exam become available for review.                           - Resume Eliquis (apixaban) at prior dose tomorrow. Gatha Mayer, MD 03/10/2020 3:23:54 PM This report has been signed electronically.

## 2020-03-10 NOTE — Progress Notes (Signed)
1425 Robinul 0.1 mg IV given due large amount of secretions upon assessment.  MD made aware, vss  ?

## 2020-03-10 NOTE — Op Note (Addendum)
[facility] Patient Name: Joel [name] Procedure Date: 03/10/2020 2:26 PM MRN: [id number] Endoscopist: Gatha Mayer , MD Age: 75 Referring MD:  Date of Birth: 1945-03-29 Gender: Male Account #: [id number] Procedure:                Upper GI endoscopy Indications:              Gastric polyps, Follow-up of gastric polyps Medicines:                Propofol per Anesthesia, Monitored Anesthesia Care Procedure:                Pre-Anesthesia Assessment:                           - Prior to the procedure, a History and Physical                            was performed, and patient medications and                            allergies were reviewed. The patient's tolerance of                            previous anesthesia was also reviewed. The risks                            and benefits of the procedure and the sedation                            options and risks were discussed with the patient.                            All questions were answered, and informed consent                            was obtained. Prior Anticoagulants: The patient                            last took Eliquis (apixaban) 2 days prior to the                            procedure. ASA Grade Assessment: II - A patient                            with mild systemic disease. After reviewing the                            risks and benefits, the patient was deemed in                            satisfactory condition to undergo the procedure.                           After obtaining informed consent, the endoscope was  passed under direct vision. Throughout the                            procedure, the patient's blood pressure, pulse, and                            oxygen saturations were monitored continuously. The                            Endoscope was introduced through the mouth, and                            advanced to the second part of duodenum. The upper                             GI endoscopy was accomplished without difficulty.                            The patient tolerated the procedure well. Scope In: Scope Out: Findings:                 Multiple diminutive sessile polyps were found in                            the cardia, in the gastric fundus and in the                            gastric body. Biopsies were taken with a cold                            forceps for histology. Verification of patient                            identification for the specimen was done. Estimated                            blood loss was minimal.                           The exam was otherwise without abnormality.                           The cardia and gastric fundus were normal on                            retroflexion.                           A small non-obstructing nodule was found on the                            left false vocal cord. Complications:            No immediate complications. Estimated Blood Loss:     Estimated blood loss was minimal. Impression:               -  Multiple gastric polyps. Biopsied.                           - The examination was otherwise normal. Recommendation:           - Patient has a contact number available for                            emergencies. The signs and symptoms of potential                            delayed complications were discussed with the                            patient. Return to normal activities tomorrow.                            Written discharge instructions were provided to the                            patient.                           - Resume previous diet.                           - Continue present medications.                           - Resume Eliquis (apixaban) at prior dose tomorrow.                           - Await pathology results.                           - See the other procedure note for documentation of                            additional recommendations.                            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

## 2020-03-10 NOTE — Patient Instructions (Addendum)
Biopsied some gastric polyps. Removed 2 colon polyps.  I will let you know pathology results and when to have another routine colonoscopy by mail and/or My Chart.  Resume Eliquis tomorrow.  There is a tiny nodule near larynx - and I think it is benign but we will have ENT look at it.  I appreciate the opportunity to care for you. Gatha Mayer, MD, FACG  YOU HAD AN ENDOSCOPIC PROCEDURE TODAY AT [facility]:   Refer to the procedure report that was given to you for any specific questions about what was found during the examination.  If the procedure report does not answer your questions, please call your gastroenterologist to clarify.  If you requested that your care partner not be given the details of your procedure findings, then the procedure report has been included in a sealed envelope for you to review at your convenience later.  YOU SHOULD EXPECT: Some feelings of bloating in the abdomen. Passage of more gas than usual.  Walking can help get rid of the air that was put into your GI tract during the procedure and reduce the bloating. If you had a lower endoscopy (such as a colonoscopy or flexible sigmoidoscopy) you may notice spotting of blood in your stool or on the toilet paper. If you underwent a bowel prep for your procedure, you may not have a normal bowel movement for a few days.  Please Note:  You might notice some irritation and congestion in your nose or some drainage.  This is from the oxygen used during your procedure.  There is no need for concern and it should clear up in a day or so.  SYMPTOMS TO REPORT IMMEDIATELY:   Following lower endoscopy (colonoscopy or flexible sigmoidoscopy):  Excessive amounts of blood in the stool  Significant tenderness or worsening of abdominal pains  Swelling of the abdomen that is new, acute  Fever of 100F or higher   Following upper endoscopy (EGD)  Vomiting of blood or coffee ground material  New chest pain or pain  under the shoulder blades  Painful or persistently difficult swallowing  New shortness of breath  Fever of 100F or higher  Black, tarry-looking stools  For urgent or emergent issues, a gastroenterologist can be reached at any hour by calling [phone]. Do not use MyChart messaging for urgent concerns.    DIET:  We do recommend a small meal at first, but then you may proceed to your regular diet.  Drink plenty of fluids but you should avoid alcoholic beverages for 24 hours.  ACTIVITY:  You should plan to take it easy for the rest of today and you should NOT DRIVE or use heavy machinery until tomorrow (because of the sedation medicines used during the test).    FOLLOW UP: Our staff will call the number listed on your records 48-72 hours following your procedure to check on you and address any questions or concerns that you may have regarding the information given to you following your procedure. If we do not reach you, we will leave a message.  We will attempt to reach you two times.  During this call, we will ask if you have developed any symptoms of COVID 19. If you develop any symptoms (ie: fever, flu-like symptoms, shortness of breath, cough etc.) before then, please call [phone].  If you test positive for Covid 19 in the 2 weeks post procedure, please call and report this information to [place].    If  any biopsies were taken you will be contacted by phone or by letter within the next 1-3 weeks.  Please call us at [phone] if you have not heard about the biopsies in 3 weeks.    SIGNATURES/CONFIDENTIALITY: You and/or your care partner have signed paperwork which will be entered into your electronic medical record.  These signatures attest to the fact that that the information above on your After Visit Summary has been reviewed and is understood.  Full responsibility of the confidentiality of this discharge information lies with you and/or your care-partner.

## 2020-03-10 NOTE — Progress Notes (Signed)
Called to room to assist during endoscopic procedure.  Patient ID and intended procedure confirmed with present staff. Received instructions for my participation in the procedure from the performing physician.  

## 2020-03-13 ENCOUNTER — Other Ambulatory Visit: Payer: Self-pay

## 2020-03-13 DIAGNOSIS — J387 Other diseases of larynx: Secondary | ICD-10-CM

## 2020-03-13 NOTE — Progress Notes (Signed)
Patient notified he will be contacted directly by ENT for an appointment for laryngeal nodule.

## 2020-03-14 ENCOUNTER — Telehealth: Payer: Self-pay | Admitting: *Deleted

## 2020-03-14 NOTE — Telephone Encounter (Signed)
  Follow up Call-  Call back number 03/10/2020 02/05/2018  Post procedure Call Back phone  # [phone]  Permission to leave phone message Yes Yes  Some recent data might be hidden     Patient questions:  Do you have a fever, pain , or abdominal swelling? No. Pain Score  0 *  Have you tolerated food without any problems? Yes.    Have you been able to return to your normal activities? Yes.    Do you have any questions about your discharge instructions: Diet   No. Medications  No. Follow up visit  No.  Do you have questions or concerns about your Care? No.  Actions: * If pain score is 4 or above: 1. No action needed, pain <4.Have you developed a fever since your procedure? no  2.   Have you had an respiratory symptoms (SOB or cough) since your procedure? no  3.   Have you tested positive for COVID 19 since your procedure no  4.   Have you had any family members/close contacts diagnosed with the COVID 19 since your procedure?  no   If yes to any of these questions please route to Joylene John, RN and Joella Prince, RN

## 2020-03-16 IMAGING — CT CT PELVIS W/ CM
2 of 3 series · 16 of 46 positions shown, 18 images · IV contrast (Omni 300)
Comparison: None.

CLINICAL DATA: Perianal abscess.  Pain.

EXAM:
CT PELVIS WITH CONTRAST
TECHNIQUE: Multidetector CT imaging of the pelvis was performed using the
standard protocol following the bolus administration of intravenous
contrast.
CONTRAST:  100mL OMNIPAQUE IOHEXOL 300 MG/ML  SOLN

[Series 3: pelvis with 5.0 · axial · 0.89mm/px · z∈[-510,-260]mm · 13 of 58 slices shown, 15 images]
[im 4/58  soft-tissue]
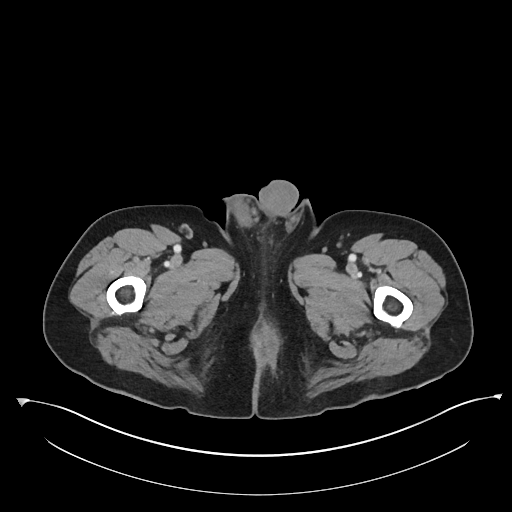
[im 4/58  bone]
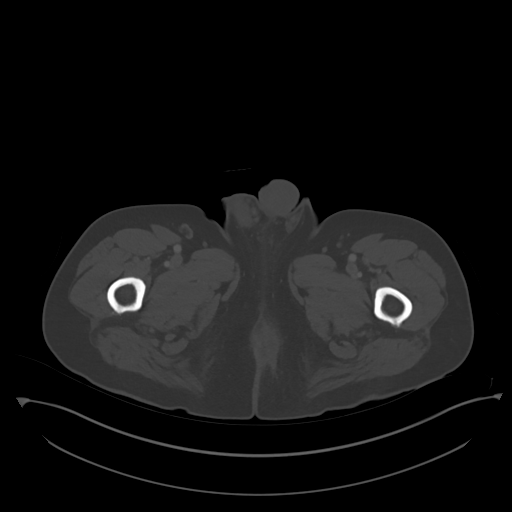
[im 8/58  soft-tissue]
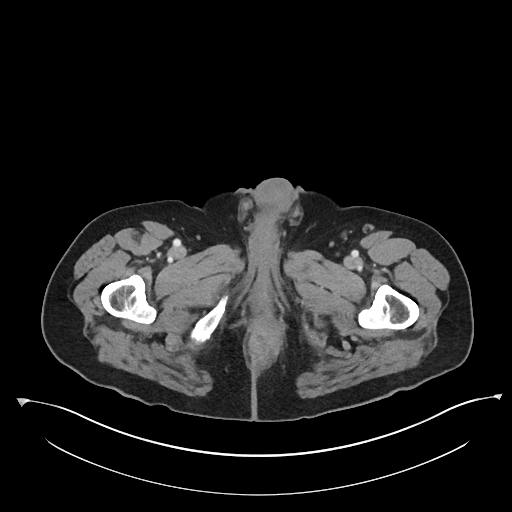
[im 12/58  soft-tissue]
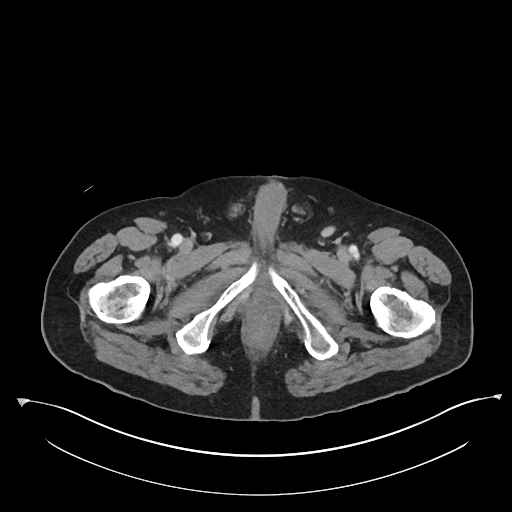
[im 17/58  soft-tissue]
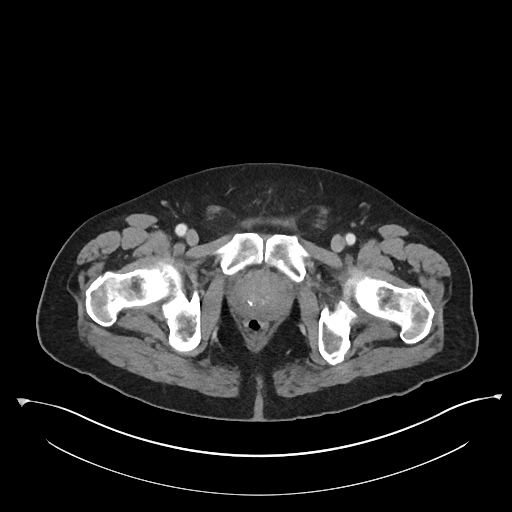
[im 21/58  soft-tissue]
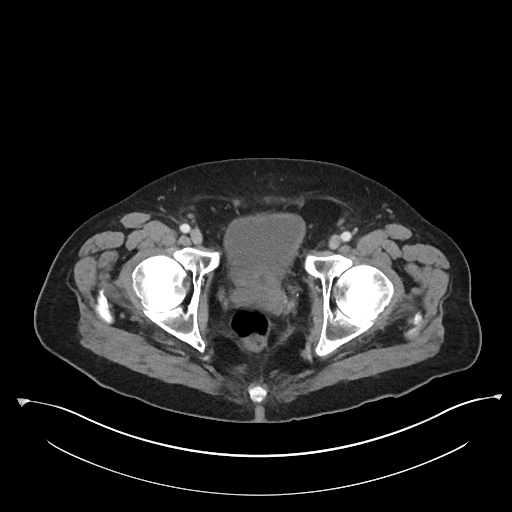
[im 24/58  soft-tissue]
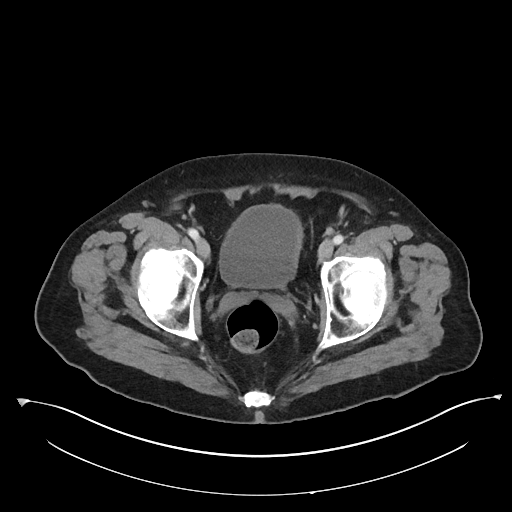
[im 30/58  soft-tissue]
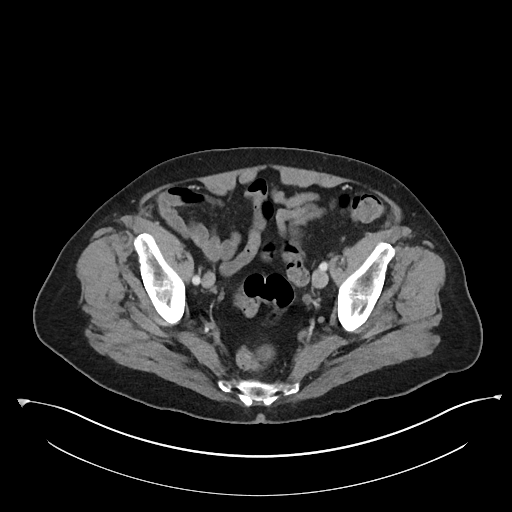
[im 34/58  soft-tissue]
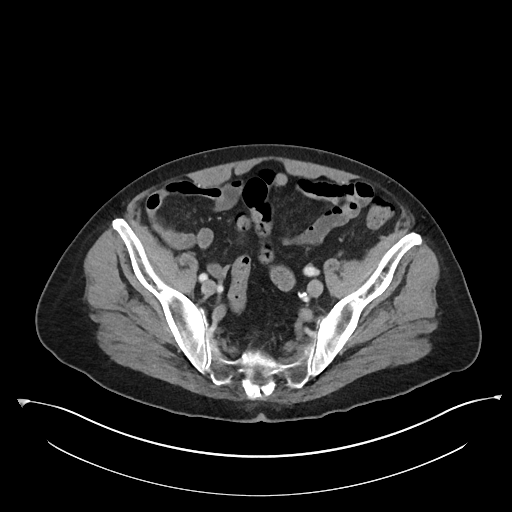
[im 37/58  soft-tissue]
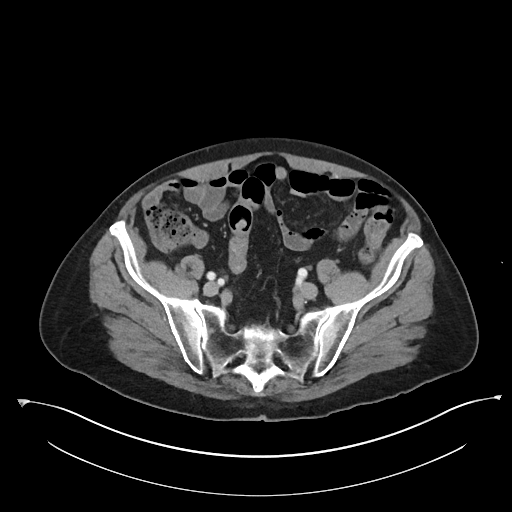
[im 37/58  bone]
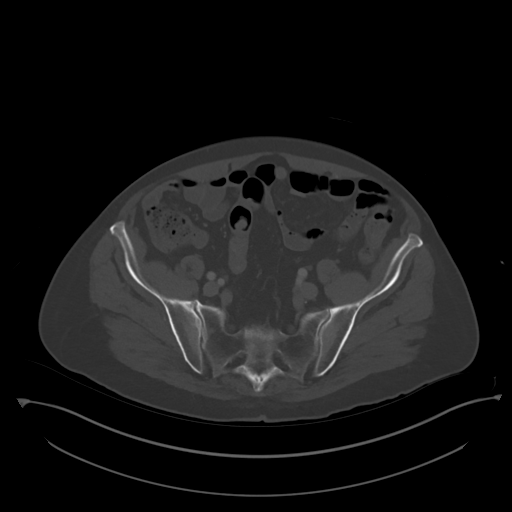
[im 41/58  soft-tissue]
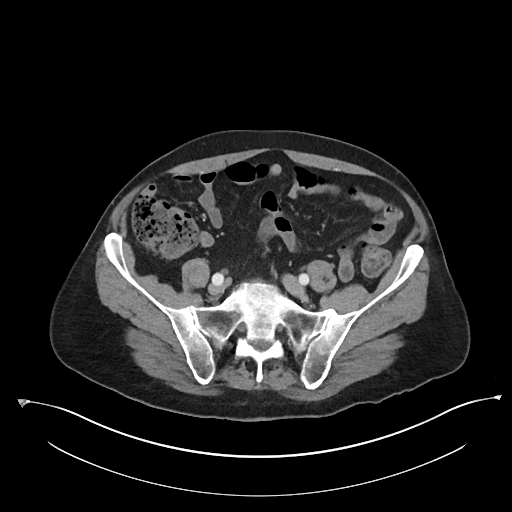
[im 46/58  soft-tissue]
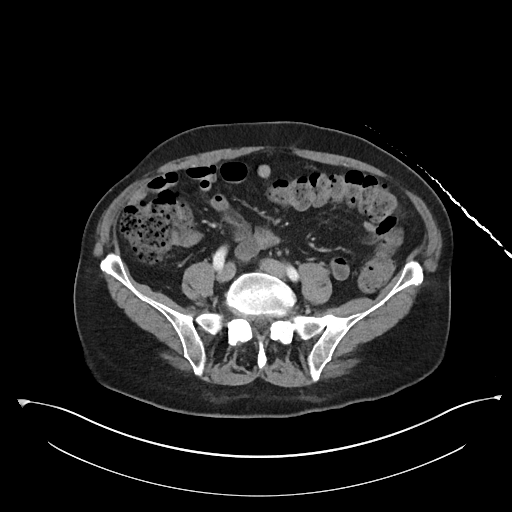
[im 50/58  soft-tissue]
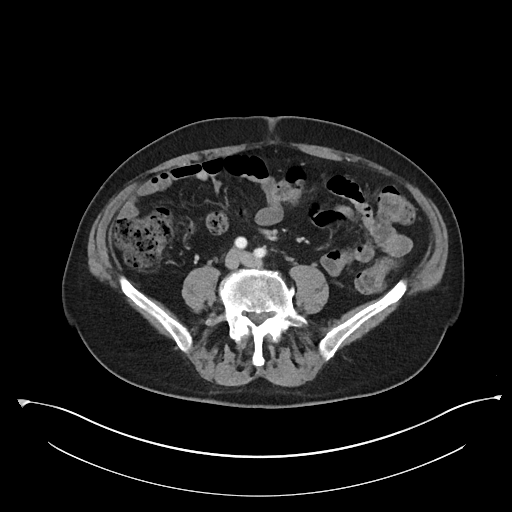
[im 54/58  soft-tissue]
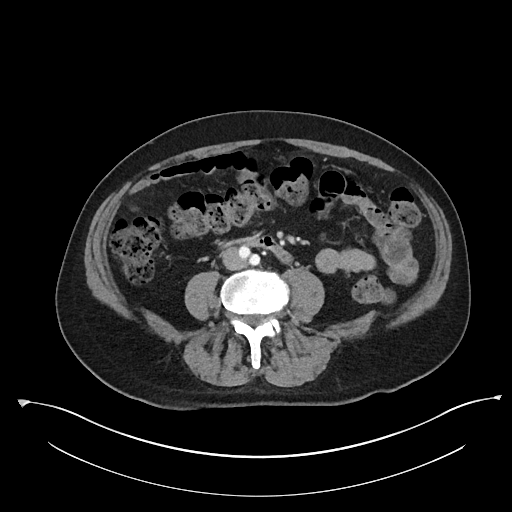

[Series 5: pelvis with 2.0 cor · coronal · 0.57mm/px · 3 of 149 slices shown]
[im 50/149  soft-tissue]
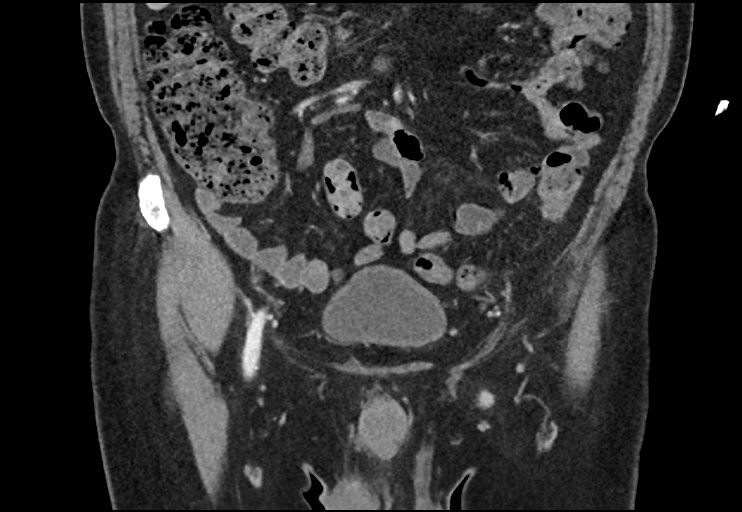
[im 66/149  soft-tissue]
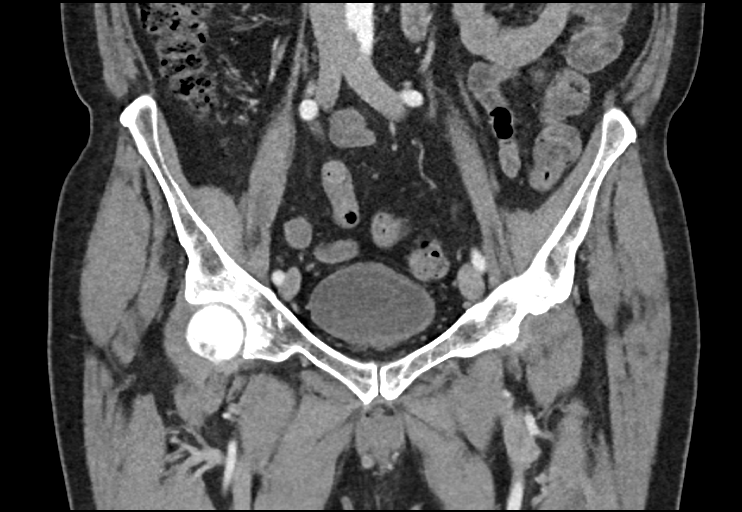
[im 83/149  soft-tissue]
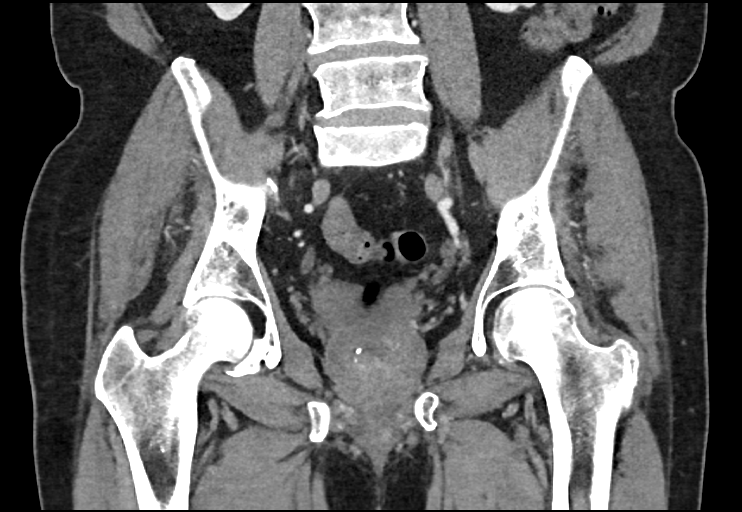

[16 of 46 positions shown; findings below may reference images not displayed]

FINDINGS: Urinary Tract: Distal ureters and urinary bladder are within normal
limits.

Bowel: Visualized small bowel is normal. Visualized ascending colon
is normal. Appendix is not visualized and may be surgically absent.
Descending and sigmoid colon are within normal limits. Rectum is
unremarkable. A left perianal fluid collection measures 2.1 x 2.8 x
2.3 cm. There are some associated inflammatory changes.

Vascular/Lymphatic: No pathologically enlarged lymph nodes. No
significant vascular abnormality seen.

Reproductive:  Prostate is enlarged, measuring 5.7 cm.

Musculoskeletal: Osseous pelvis is within normal limits. Hips are
located and within normal limits bilaterally.
IMPRESSION: 1. Left perianal fluid collection/abscess measures 2.1 x 2.8 x
cm without further extension into the pelvis.
2. Enlarged prostate gland.

## 2020-03-23 ENCOUNTER — Encounter: Payer: Self-pay | Admitting: Internal Medicine

## 2020-03-30 ENCOUNTER — Ambulatory Visit (INDEPENDENT_AMBULATORY_CARE_PROVIDER_SITE_OTHER): Payer: Medicare HMO | Admitting: Otolaryngology

## 2020-03-30 ENCOUNTER — Other Ambulatory Visit: Payer: Self-pay

## 2020-03-30 DIAGNOSIS — J310 Chronic rhinitis: Secondary | ICD-10-CM | POA: Diagnosis not present

## 2020-03-30 DIAGNOSIS — K219 Gastro-esophageal reflux disease without esophagitis: Secondary | ICD-10-CM | POA: Diagnosis not present

## 2020-03-30 DIAGNOSIS — J387 Other diseases of larynx: Secondary | ICD-10-CM

## 2020-03-30 NOTE — Progress Notes (Signed)
HPI: Joel [name] is a 75 y.o. male who presents is referred by Dr. Carlean Purl for evaluation of laryngeal nodule noted on recent upper GI endoscopy.  On review of the picture of this nodule it appears to be a submucosal cyst that lies within the piriform region outside of the endolarynx.  Patient has had no voice problems although he does complain of some excessive mucus in his throat at times.  He is on pantoprazole for reflux symptoms.  He takes his first thing in the morning.  He also uses occasional antihistamine for allergies as well as occasional use of Nasacort.  He is on a blood thinner because of history of A. fib.Marland Kitchen  Past Medical History:  Diagnosis Date  . Allergy   . Anxiety   . Arthritis   . Atrial fibrillation ([place])   . Benign prostatic hypertrophy   . Dysrhythmia   . Family history of anesthesia complication    "MOTHER DEVELOPED DEMENTIA AFTER SURGERY"  . Fundic gland polyps of stomach, benign with low-grade dysplasia 02/20/2018  . Gastric polyps - hyperplastic 02/20/2018  . GERD (gastroesophageal reflux disease)    INTERMITANT -NO MEDS  . Hx of adenomatous polyp of colon 05/23/2009  . Hypercholesterolemia   . Memory loss   . Nocturia    Past Surgical History:  Procedure Laterality Date  . APPENDECTOMY    . COLONOSCOPY W/ POLYPECTOMY    . COLOSTOMY    . FISTULOTOMY N/A 08/12/2018   Procedure: FISTULOTOMY;  Surgeon: Leighton Ruff, MD;  Location: [facility];  Service: General;  Laterality: N/A;  . INCISION AND DRAINAGE ABSCESS N/A 03/25/2018   Procedure: INCISION AND DRAINAGE PERI-RECTAL ABSCESS;  Surgeon: Donnie Mesa, MD;  Location: [facility];  Service: General;  Laterality: N/A;  . PLATE NECK  8299   TITANIUM PLATE IN NECK  . RECTAL EXAM UNDER ANESTHESIA N/A 08/12/2018   Procedure: ANAL EXAM UNDER ANESTHESIA;  Surgeon: Leighton Ruff, MD;  Location: [facility];  Service: General;  Laterality: N/A;  . TRANSURETHRAL RESECTION OF PROSTATE N/A  08/06/2013   Procedure: TRANSURETHRAL RESECTION OF THE PROSTATE (TURP) WITH GYRUS, irrigation of bladder stone, cystoscope;  Surgeon: Ailene Rud, MD;  Location: [facility];  Service: Urology;  Laterality: N/A;   Social History   Socioeconomic History  . Marital status: Married    Spouse name: Bethena Roys  . Number of children: 0  . Years of education: Not on file  . Highest education level: Not on file  Occupational History  . Occupation: psychologist    Employer: [employer]  Tobacco Use  . Smoking status: Former Smoker    Packs/day: 1.00    Types: Cigarettes    Quit date: 01/21/1990    Years since quitting: 30.2  . Smokeless tobacco: Never Used  Vaping Use  . Vaping Use: Never used  Substance and Sexual Activity  . Alcohol use: Yes    Alcohol/week: 8.0 standard drinks    Types: 8 Glasses of wine per week    Comment: 2 DRINKS 4-5 TIMES PER WK  . Drug use: No  . Sexual activity: Not on file  Other Topics Concern  . Not on file  Social History Narrative   Step Son is mentally handicapped;   Mother has moved into the home   Social Determinants of Health   Financial Resource Strain: Not on file  Food Insecurity: Not on file  Transportation Needs: Not on file  Physical Activity: Not on file  Stress: Not on file  Social Connections: Not on file   Family History  Problem Relation Age of Onset  . Hypertension Mother   . Hyperlipidemia Mother   . GER disease Mother   . Arrhythmia Father   . Stroke Father        33s  . Hypertension Sister   . GER disease Sister   . Other Sister        tumor removed from her colon, possible pre-cancerous, foot of her bowel removed  . Colon cancer Sister   . Esophageal cancer Other   . Stomach cancer Other   . Rectal cancer Other    Allergies  Allergen Reactions  . Ciprofloxacin Other (See Comments)    Is currently taking Tambocor.Marland KitchenMarland KitchenINTERACTION??  . Penicillins Hives and Rash    Did it involve swelling of the  face/tongue/throat, SOB, or low BP? Yes Did it involve sudden or severe rash/hives, skin peeling, or any reaction on the inside of your mouth or nose? Unk Did you need to seek medical attention at a hospital or doctor's office? Yes When did it last happen?1970's If all above answers are "NO", may proceed with cephalosporin use.    Prior to Admission medications   Medication Sig Start Date End Date Taking? Authorizing Provider  acetaminophen (TYLENOL) 325 MG tablet Take 325 mg by mouth every 6 (six) hours as needed for fever (or for pain or headaches).    [provider]  atorvastatin (LIPITOR) 10 MG tablet Take 1 tablet (10 mg total) by mouth daily. 03/07/15   Evans Lance, MD  cetirizine (ZYRTEC) 10 MG tablet Take 5 mg by mouth daily as needed (for seasonal allergies).     [provider]  Coenzyme Q10 (CO Q 10 PO) Take 1 capsule by mouth at bedtime.     [provider]  ELIQUIS 5 MG TABS tablet TAKE 1 TABLET BY MOUTH TWICE A DAY 12/15/19   Evans Lance, MD  flecainide (TAMBOCOR) 100 MG tablet TAKE 1 TAB BY MOUTH TWICE A DAY ALSO TAKE 1/2 TAB AS NEEDED FOR AFIB 02/02/20   Evans Lance, MD  Glucosamine HCl (GLUCOSAMINE PO) Take 1 tablet by mouth daily. Patient not taking: Reported on 03/10/2020    [provider]  metoprolol succinate (TOPROL XL) 25 MG 24 hr tablet Take 0.5 tablets (12.5 mg total) by mouth daily. 02/02/20   Evans Lance, MD  Multiple Vitamin (MULTIVITAMIN) capsule Take 1 capsule by mouth daily.    [provider]  pantoprazole (PROTONIX) 40 MG tablet Take 1 tablet (40 mg total) by mouth daily. 02/04/20   Zehr, Laban Emperor, PA-C  valACYclovir (VALTREX) 500 MG tablet Take 500 mg by mouth as needed for other. 09/21/18   [provider]     Positive ROS: Otherwise negative  All other systems have been reviewed and were otherwise negative with the exception of those mentioned in the HPI and as above.  Physical  Exam: Constitutional: Alert, well-appearing, no acute distress.  Patient has no hoarseness. Ears: External ears without lesions or tenderness. Ear canals are clear bilaterally with intact, clear TMs.  Nasal: External nose without lesions. Septum slightly deviated to the left with moderate rhinitis and clear mucus discharge.  No signs of infection.. Oral: Lips and gums without lesions. Tongue and palate mucosa without lesions. Posterior oropharynx clear. Fiberoptic laryngoscopy was performed through the right nostril.  The nasopharynx was clear.  The base of tongue vallecula and epiglottis were  normal.  The vocal cords were clear bilaterally with normal vocal mobility.  The small nodule is located on the lateral aspect of the right AE fold and is not located within the endolarynx. Neck: No palpable adenopathy or masses Respiratory: Breathing comfortably  Skin: No facial/neck lesions or rash noted.  Laryngoscopy  Date/Time: 03/30/2020 9:52 AM Performed by: Rozetta Nunnery, MD Authorized by: Rozetta Nunnery, MD   Consent:    Consent obtained:  Verbal   Consent given by:  Patient Procedure details:    Indications: direct visualization of the upper aerodigestive tract     Medication:  Afrin   Instrument: flexible fiberoptic laryngoscope     Scope location: right nare   Sinus:    Right nasopharynx: normal   Mouth:    Oropharynx: normal     Vallecula: normal     Base of tongue: normal   Throat:    True vocal cords: normal   Comments:     On fiberoptic laryngoscopy the vocal cords were clear bilaterally with normal vocal mobility.  Endolarynx was clear.  Patient had a small cyst on the lateral aspect of the larynx within the medial aspect of the right piriform sinus.  This is small and otherwise benign in appearance.    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  This is otherwise  asymptomatic and is consistent with a probable benign mucous cyst.  Plan: I discussed with patient concerning benign nature of this small cyst.  This will not cause any problems with his voice or swallowing. He apparently does have history of reflux and would recommend taking his pantoprazole before dinner instead of first thing in the morning as this will provide better nighttime coverage. Concerning his nasal allergies and postnasal drainage recommended use of Nasacort and saline rinses. He will follow-up as needed.   Radene Journey, MD   CC:

## 2020-05-23 ENCOUNTER — Telehealth: Payer: Self-pay | Admitting: Internal Medicine

## 2020-05-23 NOTE — Telephone Encounter (Signed)
New patient    Patient calling asking to speak  to a nurse concering him having AFIB. Patient has been in AFIB for three weeks. Patient states it is off and on, but it happens everyday. Please advise.

## 2020-05-25 NOTE — Telephone Encounter (Signed)
See mychart message for resolution.  Pt will increase salt in order to better tolerate beta blocker per Dr. Lovena Le

## 2020-06-14 ENCOUNTER — Ambulatory Visit: Payer: Medicare HMO | Admitting: Internal Medicine

## 2020-06-14 ENCOUNTER — Encounter: Payer: Self-pay | Admitting: Internal Medicine

## 2020-06-14 VITALS — BP 110/74 | HR 64 | Ht 74.5 in | Wt 182.5 lb

## 2020-06-14 DIAGNOSIS — D131 Benign neoplasm of stomach: Secondary | ICD-10-CM | POA: Diagnosis not present

## 2020-06-14 NOTE — Progress Notes (Addendum)
Joel [name] Serena 75 y.o. Oct 03, 1945 [id number]  Assessment & Plan:   Encounter Diagnosis  Name Primary?   Fundic gland polyps of stomach, benign with low-grade dysplasia Yes    So this is a little confusing but I reassured him I think.  I do not think I can say he is not at risk of gastric cancer but it seems very unlikely.  I do find it unusual to have low-grade dysplasia and a fundic gland polyp though I suppose it is possible.  Sometimes inflammation can be confusing for dysplasia as we know.  And then previously they were diagnosed as hyperplastic polyps.  And I had opted to survey again in a year.  He is probably comfortable with that but we have decided that I review the polyp pathology with the pathologist's and go from there consider presentation at the GI cancer conference though this is not a cancer issue.  We also talked about avoiding meats with preservatives i.e. sodium nitrates.  It sounds like he has a pretty healthy diet overall he exercises and has a healthy lifestyle from what I can tell.  I appreciate the opportunity to care for this patient.  CC: Crist Infante, MD   Polyp pathology reviewed with pathologist Dr. Vic Ripper.  As I suspected incredibly low risk of cancer transformation even with some low-grade dysplasia and a polyp here.  I have reviewed with patient via [facility].  I think repeating endoscopy in 1 year is in a very appropriate plan. Subjective:   Chief Complaint: Dysplasia and stomach polyps  HPI The patient is here to discuss pathology results after EGD and colonoscopy were performed in February.  We have had an online discussion through [facility].  I brought him in to discuss further.  The main issue is that he had fundic gland polyps with low-grade dysplasia noted.  I recommended a repeat EGD in 1 year.  He is concerned about the dysplasia and cancer risk.the EGD in February of this year was a follow-up after January 2020 demonstrated hyperplastic  gastric polyps.  Though my clinical impression at the time was fundic gland polyps.   03/10/2020 pathology  Diagnosis 1. Surgical [P], gastric antrum, body, fundus, cardia - FUNDIC GLAND POLYP WITH LOW GRADE DYSPLASIA (X1). - XANTHOMA (X1).    02/20/2018 pathology  Diagnosis Surgical [P], fundus and gastric body polyps biopsies - HYPERPLASTIC POLYP. - WARTHIN-STARRY IS NEGATIVE FOR HELICOBACTER PYLORI. - NO INTESTINAL METAPLASIA, DYSPLASIA, OR MALIGNANCY.   He has noticed that gastric polyps are a risk factor for chronic PPI use he does not have heartburn anymore after stopping PPI and will only take it occasionally.  He has an interesting correlation with gas and belching and his paroxysmal atrial fibrillation. Allergies  Allergen Reactions   Ciprofloxacin Other (See Comments)    Is currently taking Tambocor.Marland KitchenMarland KitchenINTERACTION??   Penicillins Hives and Rash    Did it involve swelling of the face/tongue/throat, SOB, or low BP? Yes Did it involve sudden or severe rash/hives, skin peeling, or any reaction on the inside of your mouth or nose? Unk Did you need to seek medical attention at a hospital or doctor's office? Yes When did it last happen? 1970's If all above answers are "NO", may proceed with cephalosporin use.    Current Meds  Medication Sig   acetaminophen (TYLENOL) 325 MG tablet Take 325 mg by mouth every 6 (six) hours as needed for fever (or for pain or headaches).   atorvastatin (LIPITOR) 10 MG tablet Take  1 tablet (10 mg total) by mouth daily.   cetirizine (ZYRTEC) 10 MG tablet Take 5 mg by mouth daily as needed (for seasonal allergies).    Coenzyme Q10 (CO Q 10 PO) Take 1 capsule by mouth at bedtime.    ELIQUIS 5 MG TABS tablet TAKE 1 TABLET BY MOUTH TWICE A DAY   flecainide (TAMBOCOR) 100 MG tablet TAKE 1 TAB BY MOUTH TWICE A DAY ALSO TAKE 1/2 TAB AS NEEDED FOR AFIB   Glucosamine HCl (GLUCOSAMINE PO) Take 1 tablet by mouth daily.   metoprolol succinate (TOPROL XL) 25  MG 24 hr tablet Take 0.5 tablets (12.5 mg total) by mouth daily. (Patient taking differently: Take 12.5 mg by mouth as needed.)   Multiple Vitamin (MULTIVITAMIN) capsule Take 1 capsule by mouth daily.   valACYclovir (VALTREX) 500 MG tablet Take 500 mg by mouth as needed for other.   Past Medical History:  Diagnosis Date   Allergy    Anxiety    Arthritis    Atrial fibrillation ([place])    Benign prostatic hypertrophy    Dysrhythmia    Family history of anesthesia complication    "MOTHER DEVELOPED DEMENTIA AFTER SURGERY"   Fundic gland polyps of stomach, benign with low-grade dysplasia 02/20/2018   Gastric polyps - hyperplastic 02/20/2018   GERD (gastroesophageal reflux disease)    INTERMITANT -NO MEDS   Hx of adenomatous polyp of colon 05/23/2009   Hypercholesterolemia    Memory loss    Nocturia    Past Surgical History:  Procedure Laterality Date   APPENDECTOMY     COLONOSCOPY W/ POLYPECTOMY     COLOSTOMY     FISTULOTOMY N/A 08/12/2018   Procedure: FISTULOTOMY;  Surgeon: Leighton Ruff, MD;  Location: [facility];  Service: General;  Laterality: N/A;   INCISION AND DRAINAGE ABSCESS N/A 03/25/2018   Procedure: INCISION AND DRAINAGE PERI-RECTAL ABSCESS;  Surgeon: Donnie Mesa, MD;  Location: [facility];  Service: General;  Laterality: N/A;   PLATE NECK  9528   TITANIUM PLATE IN NECK   RECTAL EXAM UNDER ANESTHESIA N/A 08/12/2018   Procedure: ANAL EXAM UNDER ANESTHESIA;  Surgeon: Leighton Ruff, MD;  Location: [facility];  Service: General;  Laterality: N/A;   TRANSURETHRAL RESECTION OF PROSTATE N/A 08/06/2013   Procedure: TRANSURETHRAL RESECTION OF THE PROSTATE (TURP) WITH GYRUS, irrigation of bladder stone, cystoscope;  Surgeon: Ailene Rud, MD;  Location: [facility];  Service: Urology;  Laterality: N/A;   Social History   Social History Narrative   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, water   Former smoker, about 8 alcoholic beverages a week no  drug use   family history includes Arrhythmia in his father; Colon cancer in his sister; Esophageal cancer in an other family member; GER disease in his mother and sister; Hyperlipidemia in his mother; Hypertension in his mother and sister; Other in his sister; Rectal cancer in an other family member; Stomach cancer in an other family member; Stroke in his father.   Review of Systems As above  Objective:   Physical Exam BP 110/74 (BP Location: Left Arm, Patient Position: Sitting, Cuff Size: Normal)   Pulse 64   Ht 6' 2.5" (1.892 m)   Wt 182 lb 8 oz (82.8 kg)   BMI 23.12 kg/m   20+ minutes spent on this visit before during and after

## 2020-06-14 NOTE — Patient Instructions (Signed)
I will have the stomach polyp pathology reviewed and will get back to you.  If you have not heard by mid June bug me!  I appreciate the opportunity to care for you. Gatha Mayer, MD, [facility]

## 2020-07-01 ENCOUNTER — Other Ambulatory Visit: Payer: Self-pay | Admitting: Internal Medicine

## 2020-07-03 NOTE — Telephone Encounter (Signed)
Pt's age 75, wt 82.8 kg, SCr 0.8, CrCl 96.31, last ov w/ GT 11/12/19.

## 2020-08-02 ENCOUNTER — Encounter (INDEPENDENT_AMBULATORY_CARE_PROVIDER_SITE_OTHER): Payer: Medicare HMO | Admitting: Ophthalmology

## 2020-08-02 ENCOUNTER — Other Ambulatory Visit: Payer: Self-pay

## 2020-08-02 DIAGNOSIS — I10 Essential (primary) hypertension: Secondary | ICD-10-CM | POA: Diagnosis not present

## 2020-08-02 DIAGNOSIS — H2513 Age-related nuclear cataract, bilateral: Secondary | ICD-10-CM

## 2020-08-02 DIAGNOSIS — H35033 Hypertensive retinopathy, bilateral: Secondary | ICD-10-CM

## 2020-08-02 DIAGNOSIS — H33301 Unspecified retinal break, right eye: Secondary | ICD-10-CM

## 2020-08-02 DIAGNOSIS — G43909 Migraine, unspecified, not intractable, without status migrainosus: Secondary | ICD-10-CM | POA: Diagnosis not present

## 2020-08-02 DIAGNOSIS — H43813 Vitreous degeneration, bilateral: Secondary | ICD-10-CM | POA: Diagnosis not present

## 2020-08-02 DIAGNOSIS — H35372 Puckering of macula, left eye: Secondary | ICD-10-CM

## 2020-08-09 DIAGNOSIS — E785 Hyperlipidemia, unspecified: Secondary | ICD-10-CM | POA: Diagnosis not present

## 2020-08-09 DIAGNOSIS — Z125 Encounter for screening for malignant neoplasm of prostate: Secondary | ICD-10-CM | POA: Diagnosis not present

## 2020-08-09 DIAGNOSIS — E291 Testicular hypofunction: Secondary | ICD-10-CM | POA: Diagnosis not present

## 2020-08-14 DIAGNOSIS — B0230 Zoster ocular disease, unspecified: Secondary | ICD-10-CM | POA: Diagnosis not present

## 2020-08-17 DIAGNOSIS — B351 Tinea unguium: Secondary | ICD-10-CM | POA: Diagnosis not present

## 2020-08-17 DIAGNOSIS — H524 Presbyopia: Secondary | ICD-10-CM | POA: Diagnosis not present

## 2020-08-17 DIAGNOSIS — I480 Paroxysmal atrial fibrillation: Secondary | ICD-10-CM | POA: Diagnosis not present

## 2020-08-17 DIAGNOSIS — H40013 Open angle with borderline findings, low risk, bilateral: Secondary | ICD-10-CM | POA: Diagnosis not present

## 2020-08-17 DIAGNOSIS — H2513 Age-related nuclear cataract, bilateral: Secondary | ICD-10-CM | POA: Diagnosis not present

## 2020-08-17 DIAGNOSIS — R82998 Other abnormal findings in urine: Secondary | ICD-10-CM | POA: Diagnosis not present

## 2020-08-17 DIAGNOSIS — Z7901 Long term (current) use of anticoagulants: Secondary | ICD-10-CM | POA: Diagnosis not present

## 2020-08-17 DIAGNOSIS — L0889 Other specified local infections of the skin and subcutaneous tissue: Secondary | ICD-10-CM | POA: Diagnosis not present

## 2020-08-17 DIAGNOSIS — Z0000 Encounter for general adult medical examination without abnormal findings: Secondary | ICD-10-CM | POA: Diagnosis not present

## 2020-08-17 DIAGNOSIS — E785 Hyperlipidemia, unspecified: Secondary | ICD-10-CM | POA: Diagnosis not present

## 2020-08-17 DIAGNOSIS — D6869 Other thrombophilia: Secondary | ICD-10-CM | POA: Diagnosis not present

## 2020-08-17 DIAGNOSIS — N401 Enlarged prostate with lower urinary tract symptoms: Secondary | ICD-10-CM | POA: Diagnosis not present

## 2020-08-17 DIAGNOSIS — B029 Zoster without complications: Secondary | ICD-10-CM | POA: Diagnosis not present

## 2020-08-21 DIAGNOSIS — L821 Other seborrheic keratosis: Secondary | ICD-10-CM | POA: Diagnosis not present

## 2020-08-21 DIAGNOSIS — L237 Allergic contact dermatitis due to plants, except food: Secondary | ICD-10-CM | POA: Diagnosis not present

## 2020-12-06 ENCOUNTER — Other Ambulatory Visit: Payer: Self-pay | Admitting: Internal Medicine

## 2020-12-06 DIAGNOSIS — I480 Paroxysmal atrial fibrillation: Secondary | ICD-10-CM

## 2020-12-21 ENCOUNTER — Other Ambulatory Visit: Payer: Self-pay | Admitting: Internal Medicine

## 2020-12-21 DIAGNOSIS — Z5181 Encounter for therapeutic drug level monitoring: Secondary | ICD-10-CM

## 2020-12-21 DIAGNOSIS — I4891 Unspecified atrial fibrillation: Secondary | ICD-10-CM

## 2020-12-21 NOTE — Telephone Encounter (Deleted)
Prescription refill request for Eliquis received. Indication: Afib  Last office visit: 11/12/19 - Scheduled appt with Dr Lovena Le on 01/02/21 Scr: 0.8 (07/12/19) Age: 75 Weight: 82.8kg  Labs and office visit overdue.

## 2020-12-21 NOTE — Telephone Encounter (Signed)
Prescription refill request for Eliquis received. Indication: Afib  Last office visit: 11/12/19 - Scheduled appt with Dr Lovena Le on 01/02/21 Scr: 0.8 (07/12/19) Age: 75 Weight: 82.8kg   Labs and office visit overdue. Will order labs to be drawn at scheduled appt with Dr Lovena Le on 01/02/21 and refill Eliquis for 1 month supply.

## 2020-12-25 ENCOUNTER — Other Ambulatory Visit: Payer: Self-pay

## 2020-12-25 ENCOUNTER — Encounter: Payer: Self-pay | Admitting: Podiatry

## 2020-12-25 ENCOUNTER — Ambulatory Visit: Payer: Medicare HMO | Admitting: Podiatry

## 2020-12-25 DIAGNOSIS — D689 Coagulation defect, unspecified: Secondary | ICD-10-CM | POA: Diagnosis not present

## 2020-12-25 DIAGNOSIS — Q828 Other specified congenital malformations of skin: Secondary | ICD-10-CM | POA: Diagnosis not present

## 2020-12-26 NOTE — Progress Notes (Signed)
Subjective:   Patient ID: Joel [name], male   DOB: 75 y.o.   MRN: [id number]   HPI Patient presents with chronic lesions right foot that are painful neuro   ROS      Objective:  Physical Exam  Vascular status intact with lesion formation right painful     Assessment:  Porokeratotic lesions x2 plantar right with history of being on blood thinners     Plan:  Sharp sterile debridement of lesions right small amount of bleeding applied dressings reappoint as needed will probably require future care as this will be a chronic problem

## 2021-01-02 ENCOUNTER — Other Ambulatory Visit: Payer: Self-pay

## 2021-01-02 ENCOUNTER — Encounter: Payer: Self-pay | Admitting: Internal Medicine

## 2021-01-02 ENCOUNTER — Ambulatory Visit: Payer: Medicare HMO | Admitting: Internal Medicine

## 2021-01-02 VITALS — BP 108/68 | HR 50 | Ht 76.0 in | Wt 184.6 lb

## 2021-01-02 DIAGNOSIS — I480 Paroxysmal atrial fibrillation: Secondary | ICD-10-CM

## 2021-01-02 DIAGNOSIS — I495 Sick sinus syndrome: Secondary | ICD-10-CM | POA: Diagnosis not present

## 2021-01-02 LAB — CBC WITH DIFFERENTIAL/PLATELET
Basophils Absolute: 0.1 10*3/uL (ref 0.0–0.2)
Basos: 1 %
EOS (ABSOLUTE): 0.8 10*3/uL — ABNORMAL HIGH (ref 0.0–0.4)
Eos: 8 %
Hematocrit: 45.1 % (ref 37.5–51.0)
Hemoglobin: 15.3 g/dL (ref 13.0–17.7)
Immature Grans (Abs): 0 10*3/uL (ref 0.0–0.1)
Immature Granulocytes: 0 %
Lymphocytes Absolute: 4.1 10*3/uL — ABNORMAL HIGH (ref 0.7–3.1)
Lymphs: 43 %
MCH: 33 pg (ref 26.6–33.0)
MCHC: 33.9 g/dL (ref 31.5–35.7)
MCV: 97 fL (ref 79–97)
Monocytes Absolute: 0.8 10*3/uL (ref 0.1–0.9)
Monocytes: 9 %
Neutrophils Absolute: 3.7 10*3/uL (ref 1.4–7.0)
Neutrophils: 39 %
Platelets: 254 10*3/uL (ref 150–450)
RBC: 4.64 x10E6/uL (ref 4.14–5.80)
RDW: 11.9 % (ref 11.6–15.4)
WBC: 9.4 10*3/uL (ref 3.4–10.8)

## 2021-01-02 LAB — BASIC METABOLIC PANEL
BUN/Creatinine Ratio: 13 (ref 10–24)
BUN: 12 mg/dL (ref 8–27)
CO2: 25 mmol/L (ref 20–29)
Calcium: 9.3 mg/dL (ref 8.6–10.2)
Chloride: 101 mmol/L (ref 96–106)
Creatinine, Ser: 0.9 mg/dL (ref 0.76–1.27)
Glucose: 76 mg/dL (ref 70–99)
Potassium: 4.7 mmol/L (ref 3.5–5.2)
Sodium: 139 mmol/L (ref 134–144)
eGFR: 90 mL/min/{1.73_m2} (ref >59)

## 2021-01-02 NOTE — Progress Notes (Signed)
HPI Joel [name] returns today for ongoing followup of sinus noded dysfunction, PAF, and dizziness. He has noted some bradycardia and dizziness. He notes that after stopping the toprol, his HR has improved but his dizziness did not change. He is able to walk with his wife  Allergies  Allergen Reactions   Penicillins Hives and Rash    Did it involve swelling of the face/tongue/throat, SOB, or low BP? No Did it involve sudden or severe rash/hives, skin peeling, or any reaction on the inside of your mouth or nose? Unk Did you need to seek medical attention at a hospital or doctor's office? Yes When did it last happen? 1970's If all above answers are "NO", may proceed with cephalosporin use.    Ciprofloxacin Other (See Comments)    Is currently taking Tambocor.Marland KitchenMarland KitchenINTERACTION??     Current Outpatient Medications  Medication Sig Dispense Refill   acetaminophen (TYLENOL) 325 MG tablet Take 325 mg by mouth every 6 (six) hours as needed for fever (or for pain or headaches).     atorvastatin (LIPITOR) 10 MG tablet Take 1 tablet (10 mg total) by mouth daily. 90 tablet 3   busPIRone (BUSPAR) 15 MG tablet Take 15 mg by mouth 2 (two) times daily as needed. As directed     cetirizine (ZYRTEC) 10 MG tablet Take 5 mg by mouth daily as needed (for seasonal allergies).      Coenzyme Q10 (CO Q 10 PO) Take 1 capsule by mouth at bedtime.      ELIQUIS 5 MG TABS tablet TAKE 1 TABLET BY MOUTH TWICE A DAY 60 tablet 0   flecainide (TAMBOCOR) 100 MG tablet TAKE 1 TAB BY MOUTH TWICE A DAY ALSO TAKE 1/2 TAB AS NEEDED FOR AFIB 225 tablet 0   metoprolol succinate (TOPROL XL) 25 MG 24 hr tablet Take 0.5 tablets (12.5 mg total) by mouth daily. (Patient taking differently: Take 12.5 mg by mouth as needed.) 45 tablet 3   Multiple Vitamin (MULTIVITAMIN) capsule Take 1 capsule by mouth daily.     valACYclovir (VALTREX) 500 MG tablet Take 500 mg by mouth as needed for other.     Glucosamine HCl (GLUCOSAMINE PO) Take 1  tablet by mouth daily. (Patient not taking: Reported on 01/02/2021)     pantoprazole (PROTONIX) 40 MG tablet Take 1 tablet (40 mg total) by mouth daily. (Patient not taking: Reported on 01/02/2021) 90 tablet 4   No current facility-administered medications for this visit.     Past Medical History:  Diagnosis Date   Allergy    Anxiety    Arthritis    Atrial fibrillation ([place])    Benign prostatic hypertrophy    Dysrhythmia    Family history of anesthesia complication    "MOTHER DEVELOPED DEMENTIA AFTER SURGERY"   Fundic gland polyps of stomach, benign with low-grade dysplasia 02/20/2018   Gastric polyps - hyperplastic 02/20/2018   GERD (gastroesophageal reflux disease)    INTERMITANT -NO MEDS   Hx of adenomatous polyp of colon 05/23/2009   Hypercholesterolemia    Memory loss    Nocturia     ROS:   All systems reviewed and negative except as noted in the HPI.   Past Surgical History:  Procedure Laterality Date   APPENDECTOMY     COLONOSCOPY W/ POLYPECTOMY     COLOSTOMY     FISTULOTOMY N/A 08/12/2018   Procedure: FISTULOTOMY;  Surgeon: Leighton Ruff, MD;  Location: [facility];  Service: General;  Laterality:  N/A;   INCISION AND DRAINAGE ABSCESS N/A 03/25/2018   Procedure: INCISION AND DRAINAGE PERI-RECTAL ABSCESS;  Surgeon: Donnie Mesa, MD;  Location: [facility];  Service: General;  Laterality: N/A;   PLATE NECK  7673   TITANIUM PLATE IN NECK   RECTAL EXAM UNDER ANESTHESIA N/A 08/12/2018   Procedure: ANAL EXAM UNDER ANESTHESIA;  Surgeon: Leighton Ruff, MD;  Location: [facility];  Service: General;  Laterality: N/A;   TRANSURETHRAL RESECTION OF PROSTATE N/A 08/06/2013   Procedure: TRANSURETHRAL RESECTION OF THE PROSTATE (TURP) WITH GYRUS, irrigation of bladder stone, cystoscope;  Surgeon: Ailene Rud, MD;  Location: [facility];  Service: Urology;  Laterality: N/A;     Family History  Problem Relation Age of Onset   Hypertension Mother     Hyperlipidemia Mother    GER disease Mother    Arrhythmia Father    Stroke Father        71s   Hypertension Sister    GER disease Sister    Other Sister        tumor removed from her colon, possible pre-cancerous, foot of her bowel removed   Colon cancer Sister    Esophageal cancer Other    Stomach cancer Other    Rectal cancer Other      Social History   Socioeconomic History   Marital status: Married    Spouse name: Bethena Roys   Number of children: 0   Years of education: Not on file   Highest education level: Not on file  Occupational History   Occupation: psychologist    Employer: [employer]  Tobacco Use   Smoking status: Former    Packs/day: 1.00    Types: Cigarettes    Quit date: 01/21/1990    Years since quitting: 30.9   Smokeless tobacco: Never  Vaping Use   Vaping Use: Never used  Substance and Sexual Activity   Alcohol use: Yes    Alcohol/week: 8.0 standard drinks    Types: 8 Glasses of wine per week    Comment: 2 DRINKS 4-5 TIMES PER WK   Drug use: No   Sexual activity: Not on file  Other Topics Concern   Not on file  Social History Narrative   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, water   Former smoker, about 8 alcoholic beverages a week no drug use   Social Determinants of Radio broadcast assistant Strain: Not on file  Food Insecurity: Not on file  Transportation Needs: Not on file  Physical Activity: Not on file  Stress: Not on file  Social Connections: Not on file  Intimate Partner Violence: Not on file     BP 108/68    Pulse (!) 50    Ht 6\' 4"  (1.93 m)    Wt 184 lb 9.6 oz (83.7 kg)    SpO2 98%    BMI 22.47 kg/m   Physical Exam:  Well appearing NAD HEENT: Unremarkable Neck:  No JVD, no thyromegally Lymphatics:  No adenopathy Back:  No CVA tenderness Lungs:  Clear HEART:  Regular rate rhythm, no murmurs, no rubs, no clicks Abd:  soft, positive bowel sounds, no organomegally, no rebound, no guarding Ext:  2 plus pulses,  no edema, no cyanosis, no clubbing Skin:  No rashes no nodules Neuro:  CN II through XII intact, motor grossly intact  EKG  DEVICE  Normal device function.  See PaceArt for details.   Assess/Plan:  1. PAF - he appears  to be maintaining NSR on flecainide. Continue. We discussed the timing of catheter ablation. Because he is so well controlled, we will not consider RFA. 2. Sinus node dysfunction - he is asymptomatic and his activity is unlimited. 3. HTN - His bp is well controlled 4. Coags - he will continue his eliquis.   Carleene Overlie Janielle Mittelstadt,MD

## 2021-01-02 NOTE — Patient Instructions (Addendum)
Medication Instructions:  Your physician recommends that you continue on your current medications as directed. Please refer to the Current Medication list given to you today.  Labwork: You will get lab work today:  CBC and BMP  Testing/Procedures: None ordered.  Follow-Up: Your physician wants you to follow-up in: one year with Cristopher Peru, MD   Any Other Special Instructions Will Be Listed Below (If Applicable).  If you need a refill on your cardiac medications before your next appointment, please call your pharmacy.

## 2021-01-21 ENCOUNTER — Other Ambulatory Visit: Payer: Self-pay | Admitting: Internal Medicine

## 2021-01-23 NOTE — Telephone Encounter (Signed)
Pt last saw Dr Lovena Le 01/02/21, last labs 01/02/21 Creat 0.9, age 76, weight 83.7kg, based on specified criteria pt is on appropriate dosage of Eliquis 5mg  BID for afib.  Will refill rx.

## 2021-02-04 ENCOUNTER — Other Ambulatory Visit: Payer: Self-pay | Admitting: Internal Medicine

## 2021-03-14 ENCOUNTER — Other Ambulatory Visit: Payer: Self-pay | Admitting: Internal Medicine

## 2021-03-14 DIAGNOSIS — I480 Paroxysmal atrial fibrillation: Secondary | ICD-10-CM

## 2021-06-14 ENCOUNTER — Encounter: Payer: Self-pay | Admitting: Podiatry

## 2021-06-14 ENCOUNTER — Ambulatory Visit: Payer: Medicare HMO | Admitting: Podiatry

## 2021-06-14 DIAGNOSIS — Q828 Other specified congenital malformations of skin: Secondary | ICD-10-CM

## 2021-06-14 DIAGNOSIS — D689 Coagulation defect, unspecified: Secondary | ICD-10-CM

## 2021-06-14 NOTE — Progress Notes (Signed)
Subjective:   Patient ID: Joel [name], male   DOB: 76 y.o.   MRN: [id number]   HPI Patient has significant plantar keratotic lesions right painful when pressed especially on the third digit   ROS      Objective:  Physical Exam  Neurovascular status intact porokeratotic lesion x4 plantar right painful     Assessment:  Chronic porokeratotic lesions right     Plan:  Debridement of 4 separate lesions right no iatrogenic bleeding reappoint routine care

## 2021-06-21 ENCOUNTER — Other Ambulatory Visit: Payer: Self-pay

## 2021-06-21 ENCOUNTER — Encounter: Payer: Self-pay | Admitting: Internal Medicine

## 2021-06-21 MED ORDER — PANTOPRAZOLE SODIUM 40 MG PO TBEC: 40.0000 mg | DELAYED_RELEASE_TABLET | Freq: Every day | ORAL | 2 refills | Status: DC

## 2021-07-16 ENCOUNTER — Telehealth: Payer: Self-pay

## 2021-07-16 ENCOUNTER — Ambulatory Visit (INDEPENDENT_AMBULATORY_CARE_PROVIDER_SITE_OTHER)
Admission: RE | Admit: 2021-07-16 | Discharge: 2021-07-16 | Disposition: A | Discharge status: Home / Self Care | Payer: Medicare HMO | Source: Ambulatory Visit | Attending: Physician Assistant | Admitting: Physician Assistant

## 2021-07-16 ENCOUNTER — Encounter: Payer: Self-pay | Admitting: Physician Assistant

## 2021-07-16 ENCOUNTER — Ambulatory Visit: Payer: Medicare HMO | Admitting: Physician Assistant

## 2021-07-16 VITALS — BP 100/70 | HR 76 | Ht 74.5 in | Wt 181.5 lb

## 2021-07-16 DIAGNOSIS — R1084 Generalized abdominal pain: Secondary | ICD-10-CM

## 2021-07-16 DIAGNOSIS — K317 Polyp of stomach and duodenum: Secondary | ICD-10-CM

## 2021-07-16 DIAGNOSIS — R1013 Epigastric pain: Secondary | ICD-10-CM

## 2021-07-16 DIAGNOSIS — R194 Change in bowel habit: Secondary | ICD-10-CM

## 2021-07-16 DIAGNOSIS — R109 Unspecified abdominal pain: Secondary | ICD-10-CM | POA: Diagnosis not present

## 2021-07-16 NOTE — Progress Notes (Signed)
Subjective:    Patient ID: Joel [name], male    DOB: 06/18/1945, 76 y.o.   MRN: [id number]  HPI Joel [name] is a 76 year old white male, established with Dr. Leone Payor, who comes in today after calling with recent complaints of burning discomfort in his stomach.  He had called on 06/21/2021 and was started on Protonix 40 mg p.o. daily at that time. Patient has history of GERD, and multiple diminutive sessile gastric polyps.  He had undergone EGD in February 2022, and biopsy of a fundic gland polyp at that time showed low-grade dysplasia, and a xanthoma.  Plan was to repeat EGD in 1 year. Also had colonoscopy at that same setting with removal of 2 small sessile polyps in the descending colon which were both tubular adenomas and noted to have multiple diverticuli.  Indicated for 5-year interval follow-up.  Patient has history of atrial fibrillation, has been on Eliquis, history of TIA, BPH, prior perianal abscess and family history of colon cancer in his sister.   He relates some left-sided and epigastric pain/discomfort which sometimes feels crampy and sometimes burning in nature, present over the past 6 to 7 weeks.  He says he usually has 2-3 bowel movements per day which is his normal.  After he started taking the Protonix he had a day with 5 or 6 bowel movements, feels that he purged his bowel somewhat and felt better.  He does use a stool softener as needed.  He relates previous episodes of abdominal pain in the past but nothing over the past several years.  An episode 6 or 7 years ago somewhat similar to this and was found to be related to constipation.  He has not had any associated nausea, no vomiting no fever or chills, no melena or hematochezia.  Appetite has been fine.  No NSAID use.  He has been using a heating pad in the evenings on the epigastrium and left mid abdomen. He is asking whether he should stay on the Protonix.  Review of Systems Pertinent positive and negative review of systems  were noted in the above HPI section.  All other review of systems was otherwise negative.   Outpatient Encounter Medications as of 07/16/2021  Medication Sig   acetaminophen (TYLENOL) 325 MG tablet Take 325 mg by mouth every 6 (six) hours as needed for fever (or for pain or headaches).   apixaban (ELIQUIS) 5 MG TABS tablet TAKE 1 TABLET BY MOUTH TWICE A DAY   atorvastatin (LIPITOR) 10 MG tablet Take 1 tablet (10 mg total) by mouth daily.   cetirizine (ZYRTEC) 10 MG tablet Take 5 mg by mouth daily as needed (for seasonal allergies).    Coenzyme Q10 (CO Q 10 PO) Take 1 capsule by mouth at bedtime.    flecainide (TAMBOCOR) 100 MG tablet TAKE 1 TAB BY MOUTH TWICE A DAY ALSO TAKE 1/2 TAB AS NEEDED FOR AFIB   Glucosamine HCl (GLUCOSAMINE PO) Take 1 tablet by mouth daily.   metoprolol succinate (TOPROL-XL) 25 MG 24 hr tablet TAKE 1/2 TABLET BY MOUTH EVERY DAY   Multiple Vitamin (MULTIVITAMIN) capsule Take 1 capsule by mouth daily.   pantoprazole (PROTONIX) 40 MG tablet Take 1 tablet (40 mg total) by mouth daily before breakfast.   busPIRone (BUSPAR) 15 MG tablet Take 15 mg by mouth 2 (two) times daily as needed. As directed (Patient not taking: Reported on 07/16/2021)   valACYclovir (VALTREX) 500 MG tablet Take 500 mg by mouth as needed for other. (Patient not  taking: Reported on 07/16/2021)   No facility-administered encounter medications on file as of 07/16/2021.   Allergies  Allergen Reactions   Penicillins Hives and Rash    Did it involve swelling of the face/tongue/throat, SOB, or low BP? No Did it involve sudden or severe rash/hives, skin peeling, or any reaction on the inside of your mouth or nose? Unk Did you need to seek medical attention at a hospital or doctor's office? Yes When did it last happen? 1970's If all above answers are "NO", may proceed with cephalosporin use.    Ciprofloxacin Other (See Comments)    Is currently taking Tambocor.Marland KitchenMarland KitchenINTERACTION??   Patient Active Problem List    Diagnosis Date Noted   Belching 02/04/2020   Chronic anticoagulation 02/04/2020   Perianal abscess 03/23/2018   Fundic gland polyps of stomach, benign with low-grade dysplasia 02/20/2018   Pain in joint of right shoulder 02/17/2017   Nocturia    Hypercholesterolemia    GERD (gastroesophageal reflux disease)    Family history of anesthesia complication    Arthritis    Anxiety    Family history of colon cancer - sister 01/26/2015   Sinus node dysfunction (HCC) 02/23/2014   TIA (transient ischemic attack) 02/23/2014   HYPERCHOLESTEROLEMIA 10/02/2009   ATRIAL FIBRILLATION 10/02/2009   MEMORY LOSS 10/02/2009   ELEVATED BLOOD PRESSURE 10/02/2009   BENIGN PROSTATIC HYPERTROPHY, HX OF 10/02/2009   History of colonic polyps 05/23/2009   Social History   Socioeconomic History   Marital status: Married    Spouse name: Joel [name]   Number of children: 0   Years of education: Not on file   Highest education level: Not on file  Occupational History   Occupation: psychologist    Employer: [employer]  Tobacco Use   Smoking status: Former    Packs/day: 1.00    Types: Cigarettes    Quit date: 01/21/1990    Years since quitting: 31.5   Smokeless tobacco: Never  Vaping Use   Vaping Use: Never used  Substance and Sexual Activity   Alcohol use: Yes    Alcohol/week: 8.0 standard drinks of alcohol    Types: 8 Glasses of wine per week    Comment: 2 DRINKS 4-5 TIMES PER WK   Drug use: No   Sexual activity: Not on file  Other Topics Concern   Not on file  Social History Narrative   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's  family history includes Arrhythmia in his father; Colon cancer in his sister; Esophageal cancer in an other family member; GER disease in his mother and sister; Hyperlipidemia in his mother; Hypertension in his mother and sister; Other in his sister; Rectal cancer in an other family member; Stomach cancer in an other family member; Stroke in his father.      Objective:    Vitals:   07/16/21 1022  BP: 100/70  Pulse: 76    Physical Exam Well-developed well-nourished eld WM in no acute distress.  Height, Weight,181 BMI22.9  HEENT; nontraumatic normocephalic, EOMI, PE R LA, sclera anicteric. Oropharynx; not examined today Neck; supple,  no JVD Cardiovascular; regular rate and rhythm with S1-S2, no murmur rub or gallop Pulmonary; Clear bilaterally Abdomen; soft, nontender, nondistended, no palpable mass or hepatosplenomegaly, bowel sounds are active Rectal; not done today Skin; benign exam, no jaundice rash or appreciable lesions Extremities; no clubbing cyanosis or edema skin warm and dry Neuro/Psych; alert and oriented x4, grossly nonfocal mood and affect appropriate        Assessment & Plan:   #82 76 year old white male with 6 to 7-week history of epigastric and left mid abdominal discomfort, cramping and burning in nature, intermittent and sometimes feeling better after having several bowel movements. He does have multiple diverticuli, but abdominal exam is totally benign today and not suggestive of diverticulitis. Rule out component of IBS/constipation, rule out other intra-abdominal inflammatory process  #2 history of multiple fundic gland gastric polyps-EGD February 2022 with biopsy showing low-grade dysplasia.  Plan was for follow-up EGD in 1 year. #3 history of adenomatous colon polyps-up-to-date with colonoscopy last done February 2022, indicated for 5-year interval follow-up #4 chronic anticoagulation-on Eliquis 5.  History of atrial fibrillation 6.  Prior TIA 7.  GERD 8.   Family history of colon cancer in patient's sister  Plan; Will obtain plain abdominal films today, and assess for obstipation.  If any significant constipation will give him a MiraLAX purge. If abdominal films are negative, and/or if symptoms persist after bowel purge then he may need CT of the abdomen and pelvis. We will schedule for upper endoscopy with Dr. Leone Payor.  Procedure was discussed in detail with the patient including indications risk and benefits and he is agreeable to proceed. He will need to hold Eliquis for 48 hours prior to procedure.  We will communicate with his cardiologist Dr. Ladona Ridgel to assure this is reasonable for this patient. He has not had any improvement in symptoms with Protonix and had been trying to avoid PPI with the multiple fundic gland polyps, okay to hold for now  Sammuel Cooper PA-C 07/16/2021   Cc: Rodrigo Ran, MD

## 2021-08-06 NOTE — Telephone Encounter (Signed)
Patient has been advised of recommendations.

## 2021-08-20 ENCOUNTER — Telehealth: Payer: Self-pay | Admitting: Internal Medicine

## 2021-08-20 ENCOUNTER — Encounter: Payer: Self-pay | Admitting: Internal Medicine

## 2021-08-20 NOTE — Telephone Encounter (Signed)
Good Morning Dr. Carlean Purl,  Patient called stating that he needed to reschedule his EGD that is scheduled for 8/3 at 2:30 due to not being able to stop his blood thinner at the moment.   Patient was rescheduled for 9/5 at 2:30.

## 2021-08-20 NOTE — Telephone Encounter (Signed)
Noted  

## 2021-08-20 NOTE — Telephone Encounter (Signed)
Chart reviewed - he did have ok to hold before. He has rescheduled in the future.

## 2021-08-23 ENCOUNTER — Encounter: Payer: Medicare HMO | Admitting: Internal Medicine

## 2021-09-15 ENCOUNTER — Other Ambulatory Visit: Payer: Self-pay | Admitting: Internal Medicine

## 2021-09-15 DIAGNOSIS — I480 Paroxysmal atrial fibrillation: Secondary | ICD-10-CM

## 2021-09-18 ENCOUNTER — Encounter: Payer: Self-pay | Admitting: Internal Medicine

## 2021-09-20 NOTE — Telephone Encounter (Signed)
Pt stated that he had to reschedule his last endoscopy because of afib and Dr. Lovena Le thought it unwise to stop Eliquis for two days: Pt stated that he has periods of Afibb and stated that his Cardiologist stated that it is up to Dr. Carlean Purl  to precede with the procedure without the Pt stopping the Eliquis: Pt stated that he previously  rescheduled to 10/31/2021 at 10:00 Please advise

## 2021-09-25 ENCOUNTER — Encounter: Payer: Medicare HMO | Admitting: Internal Medicine

## 2021-10-01 DIAGNOSIS — R7989 Other specified abnormal findings of blood chemistry: Secondary | ICD-10-CM | POA: Diagnosis not present

## 2021-10-01 DIAGNOSIS — Z125 Encounter for screening for malignant neoplasm of prostate: Secondary | ICD-10-CM | POA: Diagnosis not present

## 2021-10-01 DIAGNOSIS — R69 Illness, unspecified: Secondary | ICD-10-CM | POA: Diagnosis not present

## 2021-10-01 DIAGNOSIS — E291 Testicular hypofunction: Secondary | ICD-10-CM | POA: Diagnosis not present

## 2021-10-01 DIAGNOSIS — E785 Hyperlipidemia, unspecified: Secondary | ICD-10-CM | POA: Diagnosis not present

## 2021-10-01 DIAGNOSIS — Z0000 Encounter for general adult medical examination without abnormal findings: Secondary | ICD-10-CM | POA: Diagnosis not present

## 2021-10-08 DIAGNOSIS — I480 Paroxysmal atrial fibrillation: Secondary | ICD-10-CM | POA: Diagnosis not present

## 2021-10-08 DIAGNOSIS — B029 Zoster without complications: Secondary | ICD-10-CM | POA: Diagnosis not present

## 2021-10-08 DIAGNOSIS — N401 Enlarged prostate with lower urinary tract symptoms: Secondary | ICD-10-CM | POA: Diagnosis not present

## 2021-10-08 DIAGNOSIS — J309 Allergic rhinitis, unspecified: Secondary | ICD-10-CM | POA: Diagnosis not present

## 2021-10-08 DIAGNOSIS — E291 Testicular hypofunction: Secondary | ICD-10-CM | POA: Diagnosis not present

## 2021-10-08 DIAGNOSIS — E785 Hyperlipidemia, unspecified: Secondary | ICD-10-CM | POA: Diagnosis not present

## 2021-10-08 DIAGNOSIS — D6869 Other thrombophilia: Secondary | ICD-10-CM | POA: Diagnosis not present

## 2021-10-08 DIAGNOSIS — Z1331 Encounter for screening for depression: Secondary | ICD-10-CM | POA: Diagnosis not present

## 2021-10-08 DIAGNOSIS — Z0000 Encounter for general adult medical examination without abnormal findings: Secondary | ICD-10-CM | POA: Diagnosis not present

## 2021-10-08 DIAGNOSIS — Z1389 Encounter for screening for other disorder: Secondary | ICD-10-CM | POA: Diagnosis not present

## 2021-10-08 DIAGNOSIS — B351 Tinea unguium: Secondary | ICD-10-CM | POA: Diagnosis not present

## 2021-10-08 DIAGNOSIS — Z7901 Long term (current) use of anticoagulants: Secondary | ICD-10-CM | POA: Diagnosis not present

## 2021-10-08 DIAGNOSIS — R82998 Other abnormal findings in urine: Secondary | ICD-10-CM | POA: Diagnosis not present

## 2021-10-26 ENCOUNTER — Ambulatory Visit: Payer: Medicare HMO | Attending: Internal Medicine | Admitting: Internal Medicine

## 2021-10-26 ENCOUNTER — Encounter: Payer: Self-pay | Admitting: Internal Medicine

## 2021-10-26 VITALS — BP 118/70 | HR 58 | Ht 76.0 in | Wt 184.0 lb

## 2021-10-26 DIAGNOSIS — I495 Sick sinus syndrome: Secondary | ICD-10-CM | POA: Diagnosis not present

## 2021-10-26 DIAGNOSIS — I480 Paroxysmal atrial fibrillation: Secondary | ICD-10-CM | POA: Diagnosis not present

## 2021-10-26 NOTE — Patient Instructions (Addendum)
Medication Instructions:  Your physician recommends that you continue on your current medications as directed. Please refer to the Current Medication list given to you today.  *If you need a refill on your cardiac medications before your next appointment, please call your pharmacy*  Lab Work: None ordered.  If you have labs (blood work) drawn today and your tests are completely normal, you will receive your results only by: [facility] (if you have MyChart) OR A paper copy in the mail If you have any lab test that is abnormal or we need to change your treatment, we will call you to review the results.  Testing/Procedures: None ordered.  Follow-Up: You were referred to Dr. Norberto Sorenson for an Atrial Fibrillation Ablation by Dr.Gregg Lovena Le.    Cardiac Ablation Cardiac ablation is a procedure to destroy, or ablate, a small amount of heart tissue in very specific places. The heart has many electrical connections. Sometimes these connections are abnormal and can cause the heart to beat very fast or irregularly. Ablating some of the areas that cause problems can improve the heart's rhythm or return it to normal. Ablation may be done for people who: Have Wolff-Parkinson-White syndrome. Have fast heart rhythms (tachycardia). Have taken medicines for an abnormal heart rhythm (arrhythmia) that were not effective or caused side effects. Have a high-risk heartbeat that may be life-threatening. During the procedure, a small incision is made in the neck or the groin, and a long, thin tube (catheter) is inserted into the incision and moved to the heart. Small devices (electrodes) on the tip of the catheter will send out electrical currents. A type of X-ray (fluoroscopy) will be used to help guide the catheter and to provide images of the heart. Tell a health care provider about: Any allergies you have. All medicines you are taking, including vitamins, herbs, eye drops, creams, and over-the-counter  medicines. Any problems you or family members have had with anesthetic medicines. Any blood disorders you have. Any surgeries you have had. Any medical conditions you have, such as kidney failure. Whether you are pregnant or may be pregnant. What are the risks? Generally, this is a safe procedure. However, problems may occur, including: Infection. Bruising and bleeding at the catheter insertion site. Bleeding into the chest, especially into the sac that surrounds the heart. This is a serious complication. Stroke or blood clots. Damage to nearby structures or organs. Allergic reaction to medicines or dyes. Need for a permanent pacemaker if the normal electrical system is damaged. A pacemaker is a small computer that sends electrical signals to the heart and helps your heart beat normally. The procedure not being fully effective. This may not be recognized until months later. Repeat ablation procedures are sometimes done. What happens before the procedure? Medicines Ask your health care provider about: Changing or stopping your regular medicines. This is especially important if you are taking diabetes medicines or blood thinners. Taking medicines such as aspirin and ibuprofen. These medicines can thin your blood. Do not take these medicines unless your health care provider tells you to take them. Taking over-the-counter medicines, vitamins, herbs, and supplements. General instructions Follow instructions from your health care provider about eating or drinking restrictions. Plan to have someone take you home from the hospital or clinic. If you will be going home right after the procedure, plan to have someone with you for 24 hours. Ask your health care provider what steps will be taken to prevent infection. What happens during the procedure?  An IV  will be inserted into one of your veins. You will be given a medicine to help you relax (sedative). The skin on your neck or groin will be  numbed. An incision will be made in your neck or your groin. A needle will be inserted through the incision and into a large vein in your neck or groin. A catheter will be inserted into the needle and moved to your heart. Dye may be injected through the catheter to help your surgeon see the area of the heart that needs treatment. Electrical currents will be sent from the catheter to ablate heart tissue in desired areas. There are three types of energy that may be used to do this: Heat (radiofrequency energy). Laser energy. Extreme cold (cryoablation). When the tissue has been ablated, the catheter will be removed. Pressure will be held on the insertion area to prevent a lot of bleeding. A bandage (dressing) will be placed over the insertion area. The exact procedure may vary among health care providers and hospitals. What happens after the procedure? Your blood pressure, heart rate, breathing rate, and blood oxygen level will be monitored until you leave the hospital or clinic. Your insertion area will be monitored for bleeding. You will need to lie still for a few hours to ensure that you do not bleed from the insertion area. Do not drive for 24 hours or as long as told by your health care provider. Summary Cardiac ablation is a procedure to destroy, or ablate, a small amount of heart tissue using an electrical current. This procedure can improve the heart rhythm or return it to normal. Tell your health care provider about any medical conditions you may have and all medicines you are taking to treat them. This is a safe procedure, but problems may occur. Problems may include infection, bruising, damage to nearby organs or structures, or allergic reactions to medicines. Follow your health care provider's instructions about eating and drinking before the procedure. You may also be told to change or stop some of your medicines. After the procedure, do not drive for 24 hours or as long as told by  your health care provider. This information is not intended to replace advice given to you by your health care provider. Make sure you discuss any questions you have with your health care provider. Document Revised: 03/30/2021 Document Reviewed: 11/16/2018 Elsevier Patient Education  [employer].

## 2021-10-26 NOTE — Progress Notes (Signed)
HPI [name] returns today for ongoing followup of sinus noded dysfunction, PAF, and dizziness. He has noted some bradycardia and dizziness. He notes that after stopping the toprol, his HR has improved but his dizziness did not change. He is able to walk with his wife. Since I saw him last, he has had more symptoms of atrial fib with longer episodes occurring more frequently.  Allergies  Allergen Reactions   Penicillins Hives and Rash    Did it involve swelling of the face/tongue/throat, SOB, or low BP? No Did it involve sudden or severe rash/hives, skin peeling, or any reaction on the inside of your mouth or nose? Unk Did you need to seek medical attention at a hospital or doctor's office? Yes When did it last happen? 1970's If all above answers are "NO", may proceed with cephalosporin use.    Ciprofloxacin Other (See Comments)    Is currently taking Tambocor.Marland KitchenMarland KitchenINTERACTION??     Current Outpatient Medications  Medication Sig Dispense Refill   acetaminophen (TYLENOL) 325 MG tablet Take 325 mg by mouth every 6 (six) hours as needed for fever (or for pain or headaches).     apixaban (ELIQUIS) 5 MG TABS tablet TAKE 1 TABLET BY MOUTH TWICE A DAY 60 tablet 11   atorvastatin (LIPITOR) 10 MG tablet Take 1 tablet (10 mg total) by mouth daily. 90 tablet 3   busPIRone (BUSPAR) 15 MG tablet Take 15 mg by mouth 2 (two) times daily as needed. As directed     Coenzyme Q10 (CO Q 10 PO) Take 1 capsule by mouth at bedtime.      flecainide (TAMBOCOR) 100 MG tablet TAKE 1 TAB BY MOUTH TWICE A DAY ALSO TAKE 1/2 TAB AS NEEDED FOR AFIB 225 tablet 1   metoprolol succinate (TOPROL-XL) 25 MG 24 hr tablet TAKE 1/2 TABLET BY MOUTH EVERY DAY 45 tablet 3   Multiple Vitamin (MULTIVITAMIN) capsule Take 1 capsule by mouth daily.     pantoprazole (PROTONIX) 40 MG tablet Take 1 tablet (40 mg total) by mouth daily before breakfast. 30 tablet 2   valACYclovir (VALTREX) 500 MG tablet Take 500 mg by mouth as  needed for other.     No current facility-administered medications for this visit.     Past Medical History:  Diagnosis Date   Allergy    Anxiety    Arthritis    Atrial fibrillation ([place])    Benign prostatic hypertrophy    Dysrhythmia    Family history of anesthesia complication    "MOTHER DEVELOPED DEMENTIA AFTER SURGERY"   Fundic gland polyps of stomach, benign with low-grade dysplasia 02/20/2018   Gastric polyps - hyperplastic 02/20/2018   GERD (gastroesophageal reflux disease)    INTERMITANT -NO MEDS   Hx of adenomatous polyp of colon 05/23/2009   Hypercholesterolemia    Memory loss    Nocturia     ROS:   All systems reviewed and negative except as noted in the HPI.   Past Surgical History:  Procedure Laterality Date   APPENDECTOMY     COLONOSCOPY W/ POLYPECTOMY     COLOSTOMY     FISTULOTOMY N/A 08/12/2018   Procedure: FISTULOTOMY;  Surgeon: Leighton Ruff, MD;  Location: [facility];  Service: General;  Laterality: N/A;   INCISION AND DRAINAGE ABSCESS N/A 03/25/2018   Procedure: INCISION AND DRAINAGE PERI-RECTAL ABSCESS;  Surgeon: Donnie Mesa, MD;  Location: [facility];  Service: General;  Laterality: N/A;   PLATE NECK  7628  TITANIUM PLATE IN NECK   RECTAL EXAM UNDER ANESTHESIA N/A 08/12/2018   Procedure: ANAL EXAM UNDER ANESTHESIA;  Surgeon: Leighton Ruff, MD;  Location: [facility];  Service: General;  Laterality: N/A;   TRANSURETHRAL RESECTION OF PROSTATE N/A 08/06/2013   Procedure: TRANSURETHRAL RESECTION OF THE PROSTATE (TURP) WITH GYRUS, irrigation of bladder stone, cystoscope;  Surgeon: Ailene Rud, MD;  Location: [facility];  Service: Urology;  Laterality: N/A;     Family History  Problem Relation Age of Onset   Hypertension Mother    Hyperlipidemia Mother    GER disease Mother    Arrhythmia Father    Stroke Father        10s   Hypertension Sister    GER disease Sister    Other Sister        tumor removed from her  colon, possible pre-cancerous, foot of her bowel removed   Colon cancer Sister    Esophageal cancer Other    Stomach cancer Other    Rectal cancer Other      Social History   Socioeconomic History   Marital status: Married    Spouse name: Bethena Roys   Number of children: 0   Years of education: Not on file   Highest education level: Not on file  Occupational History   Occupation: psychologist    Employer: [employer]  Tobacco Use   Smoking status: Former    Packs/day: 1.00    Types: Cigarettes    Quit date: 01/21/1990    Years since quitting: 31.7   Smokeless tobacco: Never  Vaping Use   Vaping Use: Never used  Substance and Sexual Activity   Alcohol use: Yes    Alcohol/week: 8.0 standard drinks of alcohol    Types: 8 Glasses of wine per week    Comment: 2 DRINKS 4-5 TIMES PER WK   Drug use: No   Sexual activity: Not on file  Other Topics Concern   Not on file  Social History Narrative   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, water   Former smoker, about 8 alcoholic beverages a week no drug use   Social Determinants of Radio broadcast assistant Strain: Not on file  Food Insecurity: Not on file  Transportation Needs: Not on file  Physical Activity: Not on file  Stress: Not on file  Social Connections: Not on file  Intimate Partner Violence: Not on file     BP 118/70   Ht '6\' 4"'$  (1.93 m)   Wt 184 lb (83.5 kg)   SpO2 99%   BMI 22.40 kg/m   Physical Exam:  Well appearing NAD HEENT: Unremarkable Neck:  No JVD, no thyromegally Lymphatics:  No adenopathy Back:  No CVA tenderness Lungs:  Clear HEART:  Regular rate rhythm, no murmurs, no rubs, no clicks Abd:  soft, positive bowel sounds, no organomegally, no rebound, no guarding Ext:  2 plus pulses, no edema, no cyanosis, no clubbing Skin:  No rashes no nodules Neuro:  CN II through XII intact, motor grossly intact  EKG - nsr   Assess/Plan:   1. PAF - he appears to be having more atrial fib  on flecainide. We discussed the timing of catheter ablation. I will refer him to Dr. Myles Gip.  2. Sinus node dysfunction - he is asymptomatic and his activity is unlimited. 3. HTN - His bp is well controlled 4. Coags - he will continue his eliquis.   Carleene Overlie Anasia Agro,MD

## 2021-10-31 ENCOUNTER — Encounter: Payer: Self-pay | Admitting: Internal Medicine

## 2021-10-31 ENCOUNTER — Ambulatory Visit (AMBULATORY_SURGERY_CENTER): Payer: Medicare HMO | Admitting: Internal Medicine

## 2021-10-31 VITALS — BP 110/73 | HR 53 | Temp 98.4°F | Resp 15 | Ht 74.0 in | Wt 181.0 lb

## 2021-10-31 DIAGNOSIS — K2950 Unspecified chronic gastritis without bleeding: Secondary | ICD-10-CM | POA: Diagnosis not present

## 2021-10-31 DIAGNOSIS — K317 Polyp of stomach and duodenum: Secondary | ICD-10-CM | POA: Diagnosis not present

## 2021-10-31 DIAGNOSIS — K219 Gastro-esophageal reflux disease without esophagitis: Secondary | ICD-10-CM | POA: Diagnosis not present

## 2021-10-31 DIAGNOSIS — I4891 Unspecified atrial fibrillation: Secondary | ICD-10-CM | POA: Diagnosis not present

## 2021-10-31 DIAGNOSIS — K2980 Duodenitis without bleeding: Secondary | ICD-10-CM | POA: Diagnosis not present

## 2021-10-31 MED ORDER — SODIUM CHLORIDE 0.9 % IV SOLN: 500.0000 mL | Freq: Once | INTRAVENOUS | Status: DC

## 2021-10-31 NOTE — Progress Notes (Signed)
Pt resting comfortably. VSS. Airway intact. SBAR complete to RN. All questions answered.   

## 2021-10-31 NOTE — Progress Notes (Signed)
[facility] History and Physical   Primary Care Physician:  Crist Infante, MD   Reason for Procedure:   Hx gastric polyps  Plan:    EGD     HPI: Joel [name] is a 76 y.o. male for reassessment of gastric polyps - there were fundic gland polyps in 2022 and one had low-grade dysplasia   Past Medical History:  Diagnosis Date   Allergy    Anxiety    Arthritis    Atrial fibrillation ([place])    Benign prostatic hypertrophy    Dysrhythmia    Family history of anesthesia complication    "MOTHER DEVELOPED DEMENTIA AFTER SURGERY"   Fundic gland polyps of stomach, benign with low-grade dysplasia 02/20/2018   Gastric polyps - hyperplastic 02/20/2018   GERD (gastroesophageal reflux disease)    INTERMITANT -NO MEDS   Hx of adenomatous polyp of colon 05/23/2009   Hypercholesterolemia    Memory loss    Nocturia     Past Surgical History:  Procedure Laterality Date   APPENDECTOMY     COLONOSCOPY W/ POLYPECTOMY     COLOSTOMY     FISTULOTOMY N/A 08/12/2018   Procedure: FISTULOTOMY;  Surgeon: Leighton Ruff, MD;  Location: [place];  Service: General;  Laterality: N/A;   INCISION AND DRAINAGE ABSCESS N/A 03/25/2018   Procedure: INCISION AND DRAINAGE PERI-RECTAL ABSCESS;  Surgeon: Donnie Mesa, MD;  Location: [place];  Service: General;  Laterality: N/A;   PLATE NECK  3785   TITANIUM PLATE IN NECK   RECTAL EXAM UNDER ANESTHESIA N/A 08/12/2018   Procedure: ANAL EXAM UNDER ANESTHESIA;  Surgeon: Leighton Ruff, MD;  Location: [place];  Service: General;  Laterality: N/A;   TRANSURETHRAL RESECTION OF PROSTATE N/A 08/06/2013   Procedure: TRANSURETHRAL RESECTION OF THE PROSTATE (TURP) WITH GYRUS, irrigation of bladder stone, cystoscope;  Surgeon: Ailene Rud, MD;  Location: [facility];  Service: Urology;  Laterality: N/A;    Prior to Admission medications   Medication Sig Start Date End Date Taking? Authorizing Provider  acetaminophen  (TYLENOL) 325 MG tablet Take 325 mg by mouth every 6 (six) hours as needed for fever (or for pain or headaches).    [provider]  apixaban (ELIQUIS) 5 MG TABS tablet TAKE 1 TABLET BY MOUTH TWICE A DAY 01/23/21   Evans Lance, MD  atorvastatin (LIPITOR) 10 MG tablet Take 1 tablet (10 mg total) by mouth daily. 03/07/15   Evans Lance, MD  busPIRone (BUSPAR) 15 MG tablet Take 15 mg by mouth 2 (two) times daily as needed. As directed 08/17/20   [provider]  Coenzyme Q10 (CO Q 10 PO) Take 1 capsule by mouth at bedtime.     [provider]  flecainide (TAMBOCOR) 100 MG tablet TAKE 1 TAB BY MOUTH TWICE A DAY ALSO TAKE 1/2 TAB AS NEEDED FOR AFIB 09/17/21   Evans Lance, MD  metoprolol succinate (TOPROL-XL) 25 MG 24 hr tablet TAKE 1/2 TABLET BY MOUTH EVERY DAY 02/06/21   Evans Lance, MD  Multiple Vitamin (MULTIVITAMIN) capsule Take 1 capsule by mouth daily.    [provider]  pantoprazole (PROTONIX) 40 MG tablet Take 1 tablet (40 mg total) by mouth daily before breakfast. 06/21/21   Gatha Mayer, MD  valACYclovir (VALTREX) 500 MG tablet Take 500 mg by mouth as needed for other. 09/21/18   [provider]    Current Outpatient Medications  Medication Sig Dispense Refill   acetaminophen (  TYLENOL) 325 MG tablet Take 325 mg by mouth every 6 (six) hours as needed for fever (or for pain or headaches).     apixaban (ELIQUIS) 5 MG TABS tablet TAKE 1 TABLET BY MOUTH TWICE A DAY 60 tablet 11   atorvastatin (LIPITOR) 10 MG tablet Take 1 tablet (10 mg total) by mouth daily. 90 tablet 3   busPIRone (BUSPAR) 15 MG tablet Take 15 mg by mouth 2 (two) times daily as needed. As directed     Coenzyme Q10 (CO Q 10 PO) Take 1 capsule by mouth at bedtime.      flecainide (TAMBOCOR) 100 MG tablet TAKE 1 TAB BY MOUTH TWICE A DAY ALSO TAKE 1/2 TAB AS NEEDED FOR AFIB 225 tablet 1   metoprolol succinate (TOPROL-XL) 25 MG 24 hr tablet TAKE 1/2 TABLET BY MOUTH EVERY DAY 45  tablet 3   Multiple Vitamin (MULTIVITAMIN) capsule Take 1 capsule by mouth daily.     pantoprazole (PROTONIX) 40 MG tablet Take 1 tablet (40 mg total) by mouth daily before breakfast. 30 tablet 2   valACYclovir (VALTREX) 500 MG tablet Take 500 mg by mouth as needed for other.     No current facility-administered medications for this visit.    Allergies as of 10/31/2021 - Review Complete 10/26/2021  Allergen Reaction Noted   Penicillins Hives and Rash 05/10/2009   Ciprofloxacin Other (See Comments) 03/23/2018    Family History  Problem Relation Age of Onset   Hypertension Mother    Hyperlipidemia Mother    GER disease Mother    Arrhythmia Father    Stroke Father        78s   Hypertension Sister    GER disease Sister    Other Sister        tumor removed from her colon, possible pre-cancerous, foot of her bowel removed   Colon cancer Sister    Esophageal cancer Other    Stomach cancer Other    Rectal cancer Other     Social History   Socioeconomic History   Marital status: Married    Spouse name: Bethena Roys   Number of children: 0   Years of education: Not on file   Highest education level: Not on file  Occupational History   Occupation: psychologist    Employer: [employer]  Tobacco Use   Smoking status: Former    Packs/day: 1.00    Types: Cigarettes    Quit date: 01/21/1990    Years since quitting: 31.7   Smokeless tobacco: Never  Vaping Use   Vaping Use: Never used  Substance and Sexual Activity   Alcohol use: Yes    Alcohol/week: 8.0 standard drinks of alcohol    Types: 8 Glasses of wine per week    Comment: 2 DRINKS 4-5 TIMES PER WK   Drug use: No   Sexual activity: Not on file  Other Topics Concern   Not on file  Social History Narrative   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  Physical Exam: Vital signs There were no vitals taken for this visit.  General:   Alert,  Well-developed, well-nourished, pleasant and cooperative in NAD Lungs:  Clear throughout to auscultation.   Heart:  Regular rate and rhythm; no murmurs, clicks, rubs,  or gallops. Abdomen:  Soft, nontender and nondistended. Normal bowel sounds.   Neuro/Psych:  Alert and cooperative. Normal mood and affect. A and O x 3   '@Oney Tatlock'$  Simonne Maffucci, MD, [facility] [phone] (pager) 10/31/2021 7:49 AM@

## 2021-10-31 NOTE — Patient Instructions (Addendum)
I saw stomach polyps like last year and took biopsies. All polyps tiny and benign-appearing.   There was a tiny intestinal polyp also - biopsied.  I will let you know results and plans.  Resume Eliquis tomorrow.  I appreciate the opportunity to care for you. Gatha Mayer, MD, [facility]    Await pathology results of biopsies done     YOU HAD AN ENDOSCOPIC PROCEDURE TODAY AT [facility]:   Refer to the procedure report that was given to you for any specific questions about what was found during the examination.  If the procedure report does not answer your questions, please call your gastroenterologist to clarify.  If you requested that your care partner not be given the details of your procedure findings, then the procedure report has been included in a sealed envelope for you to review at your convenience later.  YOU SHOULD EXPECT: Some feelings of bloating in the abdomen. Passage of more gas than usual.  Walking can help get rid of the air that was put into your GI tract during the procedure and reduce the bloating. If you had a lower endoscopy (such as a colonoscopy or flexible sigmoidoscopy) you may notice spotting of blood in your stool or on the toilet paper. If you underwent a bowel prep for your procedure, you may not have a normal bowel movement for a few days.  Please Note:  You might notice some irritation and congestion in your nose or some drainage.  This is from the oxygen used during your procedure.  There is no need for concern and it should clear up in a day or so.  SYMPTOMS TO REPORT IMMEDIATELY:  Following upper endoscopy (EGD)  Vomiting of blood or coffee ground material  New chest pain or pain under the shoulder blades  Painful or persistently difficult swallowing  New shortness of breath  Fever of 100F or higher  Black, tarry-looking stools  For urgent or emergent issues, a gastroenterologist can be reached at any hour by calling [phone]  [phone]. Do not use MyChart messaging for urgent concerns.    DIET:  We do recommend a small meal at first, but then you may proceed to your regular diet.  Drink plenty of fluids but you should avoid alcoholic beverages for 24 hours.  ACTIVITY:  You should plan to take it easy for the rest of today and you should NOT DRIVE or use heavy machinery until tomorrow (because of the sedation medicines used during the test).    FOLLOW UP: Our staff will call the number listed on your records the next business day following your procedure.  We will call around 7:15- 8:00 am to check on you and address any questions or concerns that you may have regarding the information given to you following your procedure. If we do not reach you, we will leave a message.     If any biopsies were taken you will be contacted by phone or by letter within the next 1-3 weeks.  Please call us at [phone] if you have not heard about the biopsies in 3 weeks.    SIGNATURES/CONFIDENTIALITY: You and/or your care partner have signed paperwork which will be entered into your electronic medical record.  These signatures attest to the fact that that the information above on your After Visit Summary has been reviewed and is understood.  Full responsibility of the confidentiality of this discharge information lies with you and/or your care-partner.

## 2021-10-31 NOTE — Op Note (Signed)
[facility] Patient Name: Joel [name] Procedure Date: 10/31/2021 9:39 AM MRN: [id number] Endoscopist: Gatha Mayer , MD Age: 76 Referring MD:  Date of Birth: 12/27/1945 Gender: Male Account #: [id number] Procedure:                Upper GI endoscopy Indications:              Follow-up of gastric polyps Medicines:                Monitored Anesthesia Care Procedure:                Pre-Anesthesia Assessment:                           - Prior to the procedure, a History and Physical                            was performed, and patient medications and                            allergies were reviewed. The patient's tolerance of                            previous anesthesia was also reviewed. The risks                            and benefits of the procedure and the sedation                            options and risks were discussed with the patient.                            All questions were answered, and informed consent                            was obtained. Prior Anticoagulants: The patient                            last took Eliquis (apixaban) 2 days prior to the                            procedure. ASA Grade Assessment: III - A patient                            with severe systemic disease. After reviewing the                            risks and benefits, the patient was deemed in                            satisfactory condition to undergo the procedure.                           After obtaining informed consent, the endoscope was  passed under direct vision. Throughout the                            procedure, the patient's blood pressure, pulse, and                            oxygen saturations were monitored continuously. The                            GIF [id number] #[id number] was introduced through the                            mouth, and advanced to the second part of duodenum.                            The upper GI endoscopy  was accomplished without                            difficulty. The patient tolerated the procedure                            well. Scope In: Scope Out: Findings:                 Multiple diminutive sessile polyps were found in                            the cardia, in the gastric fundus and in the                            gastric body. Biopsies were taken with a cold                            forceps for histology. Verification of patient                            identification for the specimen was done. Estimated                            blood loss was minimal.                           A single 4 mm sessile polyp was found in the                            duodenal bulb. Biopsies were taken with a cold                            forceps for histology. Verification of patient                            identification for the specimen was done. Estimated  blood loss was minimal.                           The exam was otherwise without abnormality.                           The cardia and gastric fundus were otherwise normal                            on retroflexion. Complications:            No immediate complications. Estimated Blood Loss:     Estimated blood loss was minimal. Impression:               - Multiple gastric polyps. Biopsied. Last year had                            a fundic gland polyp w/ low-grade dysplasia                           - A single duodenal polyp. Biopsied.                           - The examination was otherwise normal. Recommendation:           - Patient has a contact number available for                            emergencies. The signs and symptoms of potential                            delayed complications were discussed with the                            patient. Return to normal activities tomorrow.                            Written discharge instructions were provided to the                             patient.                           - Resume previous diet.                           - Continue present medications.                           - Await pathology results.                           - Resume Eliquis (apixaban) at prior dose tomorrow. Gatha Mayer, MD 10/31/2021 10:09:02 AM This report has been signed electronically.

## 2021-10-31 NOTE — Progress Notes (Signed)
Called to room to assist during endoscopic procedure.  Patient ID and intended procedure confirmed with present staff. Received instructions for my participation in the procedure from the performing physician.  

## 2021-11-01 ENCOUNTER — Telehealth: Payer: Self-pay

## 2021-11-01 NOTE — Telephone Encounter (Signed)
  Follow up Call-     10/31/2021    9:07 AM 03/10/2020    1:46 PM  Call back number  Post procedure Call Back phone  # [phone]  Permission to leave phone message Yes Yes     Patient questions:  Do you have a fever, pain , or abdominal swelling? No. Pain Score  0 *  Have you tolerated food without any problems? Yes.    Have you been able to return to your normal activities? Yes.    Do you have any questions about your discharge instructions: Diet   No. Medications  No. Follow up visit  No.  Do you have questions or concerns about your Care? No.  Actions: * If pain score is 4 or above: No action needed, pain <4.

## 2021-11-07 ENCOUNTER — Encounter: Payer: Self-pay | Admitting: Internal Medicine

## 2021-11-12 ENCOUNTER — Ambulatory Visit: Payer: Medicare HMO | Attending: Cardiovascular Disease | Admitting: Cardiovascular Disease

## 2021-11-12 ENCOUNTER — Ambulatory Visit (INDEPENDENT_AMBULATORY_CARE_PROVIDER_SITE_OTHER): Payer: Medicare HMO

## 2021-11-12 ENCOUNTER — Encounter: Payer: Self-pay | Admitting: Cardiovascular Disease

## 2021-11-12 VITALS — BP 118/72 | HR 70 | Ht 74.0 in | Wt 186.0 lb

## 2021-11-12 DIAGNOSIS — I495 Sick sinus syndrome: Secondary | ICD-10-CM | POA: Diagnosis not present

## 2021-11-12 DIAGNOSIS — I480 Paroxysmal atrial fibrillation: Secondary | ICD-10-CM

## 2021-11-12 NOTE — Patient Instructions (Addendum)
Medication Instructions:  Your physician recommends that you continue on your current medications as directed. Please refer to the Current Medication list given to you today.  *If you need a refill on your cardiac medications before your next appointment, please call your pharmacy*   Lab Work: BMET and CBC If you have labs (blood work) drawn today and your tests are completely normal, you will receive your results only by: [place] (if you have MyChart) OR A paper copy in the mail If you have any lab test that is abnormal or we need to change your treatment, we will call you to review the results.  Testing/Procedures: Your physician has recommended that you wear an event monitor. Event monitors are medical devices that record the heart's electrical activity. Doctors most often [place] these monitors to diagnose arrhythmias. Arrhythmias are problems with the speed or rhythm of the heartbeat. The monitor is a small, portable device. You can wear one while you do your normal daily activities. This is usually used to diagnose what is causing palpitations/syncope (passing out).  Your physician has requested that you have cardiac CT. Cardiac computed tomography (CT) is a painless test that uses an x-ray machine to take clear, detailed pictures of your heart. For further information please visit [email or web address]. Please follow instruction sheet as given.  Your physician has recommended that you have an ablation. Catheter ablation is a medical procedure used to treat some cardiac arrhythmias (irregular heartbeats). During catheter ablation, a long, thin, flexible tube is put into a blood vessel in your groin (upper thigh), or neck. This tube is called an ablation catheter. It is then guided to your heart through the blood vessel. Radio frequency waves destroy small areas of heart tissue where abnormal heartbeats may cause an arrhythmia to start. Please see the instruction sheet given to you  today.  Follow-Up: At [facility], you and your health needs are our priority.  As part of our continuing mission to provide you with exceptional heart care, we have created designated Provider Care Teams.  These Care Teams include your primary Cardiologist (physician) and Advanced Practice Providers (APPs -  Physician Assistants and Nurse Practitioners) who all work together to provide you with the care you need, when you need it.  Your next appointment:   See instruction letter  ZIO AT Long term monitor-Live Telemetry  Your physician has requested you wear a ZIO patch monitor for 14 days.  This is a single patch monitor. [employer] supplies one patch monitor per enrollment. Additional  stickers are not available.  Please do not apply patch if you will be having a Nuclear Stress Test, Echocardiogram, Cardiac CT, MRI,  or Chest Xray during the period you would be wearing the monitor. The patch cannot be worn during  these tests. You cannot remove and re-apply the ZIO AT patch monitor.  Your ZIO patch monitor will be mailed 3 day USPS to your address on file. It may take 3-5 days to  receive your monitor after you have been enrolled.  Once you have received your monitor, please review the enclosed instructions. Your monitor has  already been registered assigning a specific monitor serial # to you.   Billing and Patient Assistance Program information  Joel [name] has been supplied with any insurance information on record for billing. [employer] offers a sliding scale Patient Assistance Program for patients without insurance, or whose  insurance does not completely cover the cost of the ZIO patch monitor. You must apply for the  Patient Assistance Program to qualify for the discounted rate. To apply, call [employer] at [phone],  select option 4, select option 2 , ask to apply for the Patient Assistance Program, (you can request an  interpreter if needed). [employer] will ask your household  income and how many people are in your  household. [employer] will quote your out-of-pocket cost based on this information. They will also be able  to set up a 12 month interest free payment plan if needed.  Applying the monitor   Shave hair from upper left chest.  Hold the abrader disc by orange tab. Rub the abrader in 40 strokes over left upper chest as indicated in  your monitor instructions.  Clean area with 4 enclosed alcohol pads. Use all pads to ensure the area is cleaned thoroughly. Let  dry.  Apply patch as indicated in monitor instructions. Patch will be placed under collarbone on left side of  chest with arrow pointing upward.  Rub patch adhesive wings for 2 minutes. Remove the white label marked "1". Remove the white label  marked "2". Rub patch adhesive wings for 2 additional minutes.  While looking in a mirror, press and release button in center of patch. A small green light will flash 3-4  times. This will be your only indicator that the monitor has been turned on.  Do not shower for the first 24 hours. You may shower after the first 24 hours.  Press the button if you feel a symptom. You will hear a small click. Record Date, Time and Symptom in  the Patient Log.   Starting the Gateway  In your kit there is a Hydrographic surveyor box the size of a cellphone. This is Airline pilot. It transmits all your  recorded data to [facility]. This box must always stay within 10 feet of you. Open the box and push the *  button. There will be a light that blinks orange and then green a few times. When the light stops  blinking, the Gateway is connected to the ZIO patch. Call [employer] at [phone] to confirm your monitor is transmitting.  Returning your monitor  Remove your patch and place it inside the [place]. In the lower half of the Gateway there is a white  bag with prepaid postage on it. Place Gateway in bag and seal. Mail package back to [place] as soon as  possible. Your physician should  have your final report approximately 7 days after you have mailed back  your monitor. Call [place] at [phone] if you have questions regarding your ZIO AT  patch monitor. Call them immediately if you see an orange light blinking on your monitor.  If your monitor falls off in less than 4 days, contact our Monitor department at [phone]. If your  monitor becomes loose or falls off after 4 days call [employer] at [phone] for suggestions on  securing your monitor

## 2021-11-12 NOTE — Addendum Note (Signed)
Addended by: Bernestine Amass on: 11/12/2021 04:22 PM   Modules accepted: Orders

## 2021-11-12 NOTE — Progress Notes (Signed)
Cardiology Office Note:    Date:  11/12/2021   ID:  Alphonsa Brickle Pollinger, DOB Dec 21, 1945, MRN [id number]  PCP:  Crist Infante, [place] [facility] Cardiologist:  Cristopher Peru, MD     Referring MD: Evans Lance, MD   Chief complaint: palpitations  History of Present Illness:    Joel [name] is a 76 y.o. male with a hx of sinus node dysfunction and atrial fibrillation referred by Dr. Lovena Le for arrhythmia management.  AF is documented on an ECG from 10/14/2009 under the media tab.  His AF had been reasonably well-managed on flecainide, but he has been having increased episodes. He was referred to discuss ablation. He has palpitations during episodes and can sense an irregular pulse. He has not had an EKG during an episode, to his recollection, for some time.  He is doing well otherwise, no chest pain, dyspnea, dependent edema.   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"   Fundic gland polyps of stomach, benign with low-grade dysplasia 02/20/2018   Gastric polyps - hyperplastic 02/20/2018   GERD (gastroesophageal reflux disease)    INTERMITANT -NO MEDS   Hx of adenomatous polyp of colon 05/23/2009   Hypercholesterolemia    Memory loss    Nocturia     Past Surgical History:  Procedure Laterality Date   APPENDECTOMY     COLONOSCOPY W/ POLYPECTOMY     COLOSTOMY     FISTULOTOMY N/A 08/12/2018   Procedure: FISTULOTOMY;  Surgeon: Leighton Ruff, MD;  Location: [place];  Service: General;  Laterality: N/A;   INCISION AND DRAINAGE ABSCESS N/A 03/25/2018   Procedure: INCISION AND DRAINAGE PERI-RECTAL ABSCESS;  Surgeon: Donnie Mesa, MD;  Location: [place];  Service: General;  Laterality: N/A;   PLATE NECK  7793   TITANIUM PLATE IN NECK   RECTAL  EXAM UNDER ANESTHESIA N/A 08/12/2018   Procedure: ANAL EXAM UNDER ANESTHESIA;  Surgeon: Leighton Ruff, MD;  Location: [place];  Service: General;  Laterality: N/A;   TRANSURETHRAL RESECTION OF PROSTATE N/A 08/06/2013   Procedure: TRANSURETHRAL RESECTION OF THE PROSTATE (TURP) WITH GYRUS, irrigation of bladder stone, cystoscope;  Surgeon: Ailene Rud, MD;  Location: [facility];  Service: Urology;  Laterality: N/A;    Current Medications: Current Meds  Medication Sig   acetaminophen (TYLENOL) 325 MG tablet Take 325 mg by mouth every 6 (six) hours as needed for fever (or for pain or headaches).   apixaban (ELIQUIS) 5 MG TABS tablet TAKE 1 TABLET BY MOUTH TWICE A DAY   atorvastatin (LIPITOR) 10 MG tablet Take 1 tablet (10 mg total) by mouth daily.   busPIRone (BUSPAR) 15 MG tablet Take 15 mg by mouth 2 (two) times daily as needed. As directed   Coenzyme Q10 (CO Q 10 PO) Take 1 capsule by mouth at bedtime.    flecainide (TAMBOCOR) 100 MG tablet TAKE 1 TAB BY MOUTH TWICE A DAY ALSO TAKE 1/2 TAB AS NEEDED FOR AFIB   metoprolol succinate (TOPROL-XL) 25 MG 24 hr tablet TAKE 1/2 TABLET BY MOUTH EVERY DAY (Patient taking differently: Take 12.5 mg by mouth as needed.)   Multiple Vitamin (MULTIVITAMIN) capsule Take 1 capsule by mouth daily.   pantoprazole (PROTONIX)  40 MG tablet Take 1 tablet (40 mg total) by mouth daily before breakfast.   valACYclovir (VALTREX) 500 MG tablet Take 500 mg by mouth as needed for other.     Allergies:   Penicillins and Ciprofloxacin   Social History   Socioeconomic History   Marital status: Married    Spouse name: Bethena Roys   Number of children: 0   Years of education: Not on file   Highest education level: Not on file  Occupational History   Occupation: psychologist    Employer: [employer]  Tobacco Use   Smoking status: Former    Packs/day: 1.00    Types: Cigarettes    Quit date: 01/21/1990    Years since quitting: 31.8   Smokeless  tobacco: Never  Vaping Use   Vaping Use: Never used  Substance and Sexual Activity   Alcohol use: Yes    Alcohol/week: 8.0 standard drinks of alcohol    Types: 8 Glasses of wine per week    Comment: 2 DRINKS 4-5 TIMES PER WK   Drug use: No   Sexual activity: Not on file  Other Topics Concern   Not on file  Social History Narrative   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, water   Former smoker, about 8 alcoholic beverages a week no drug use   Social Determinants of Radio broadcast assistant Strain: Not on file  Food Insecurity: Not on file  Transportation Needs: Not on file  Physical Activity: Not on file  Stress: Not on file  Social Connections: Not on file     Family History: The patient's family history includes Arrhythmia in his father; Colon cancer in his sister; Esophageal cancer in an other family member; GER disease in his mother and sister; Hyperlipidemia in his mother; Hypertension in his mother and sister; Other in his sister; Rectal cancer in an other family member; Stomach cancer in an other family member; Stroke in his father.  ROS:   Please see the history of present illness.    All other systems reviewed and are negative.  EKGs/Labs/Other Studies Reviewed:     Monitor ordered today  EKG:  Last EKG results: Sinus rhythm 70 bpm   Recent Labs: 01/02/2021: BUN 12; Creatinine, Ser 0.90; Hemoglobin 15.3; Platelets 254; Potassium 4.7; Sodium 139    Risk Assessment/Calculations:    CHA2DS2-VASc Score = 3   This indicates a 3.2% annual risk of stroke. The patient's score is based upon: CHF History: 0 HTN History: 0 Diabetes History: 0 Stroke History: 2 Vascular Disease History: 0 Age Score: 1 Gender Score: 0     Physical Exam:    VS:  BP 118/72   Pulse 70   Ht '6\' 2"'$  (1.88 m)   Wt 186 lb (84.4 kg)   SpO2 98%   BMI 23.88 kg/m     Wt Readings from Last 3 Encounters:  11/12/21 186 lb (84.4 kg)  10/31/21 181 lb (82.1 kg)  10/26/21 184  lb (83.5 kg)     GEN:  Well nourished, well developed in no acute distress CARDIAC: brady, regular; no murmurs, rubs, gallops RESPIRATORY:  Normal work of breathing MUSCULOSKELETAL: no edema    ASSESSMENT & PLAN:    Atrial fibrillation: Paroxysmal. failed flecainide. I'm going to schedule him for an AF ablation. Prior to that, will place a 7 day monitor to verify that his symptoms are due to AF and that he is not also having arrhythmia by a different mechanism --  eg flutter or PACs. We discussed the indication, rationale, logistics, anticipated benefits, and potential risks of the ablation procedure including but not limited to -- bleed at the groin access site, chest pain, damage to nearby organs such as the diaphragm, hungs, or esophagus, need for a drainage tube, or prolonged hospitalization. I explained that the risk for stroke, heart attack, need for open chest surgery, or even death is very low but not zero. He expressed understanding and wishes to proceed.  2.   High risk medication: on flecainide. Hopefully, we will be able to discontinue this after ablation. 3.   Secondary hypercoagulable state: continue eliquis for CHADS2VASC of 3         Medication Adjustments/Labs and Tests Ordered: Current medicines are reviewed at length with the patient today.  Concerns regarding medicines are outlined above.  Orders Placed This Encounter  Procedures   EKG 12-Lead   No orders of the defined types were placed in this encounter.    Signed, Melida Quitter, MD  11/12/2021 4:17 PM    [place]

## 2021-11-12 NOTE — Progress Notes (Unsigned)
Enrolled for [employer] to mail a ZIO XT long term holter monitor to the patients address on file.  

## 2021-11-16 DIAGNOSIS — I480 Paroxysmal atrial fibrillation: Secondary | ICD-10-CM

## 2021-11-16 DIAGNOSIS — I495 Sick sinus syndrome: Secondary | ICD-10-CM

## 2021-11-21 ENCOUNTER — Encounter: Payer: Self-pay | Admitting: Podiatry

## 2021-11-21 ENCOUNTER — Ambulatory Visit: Payer: Medicare HMO | Admitting: Podiatry

## 2021-11-21 DIAGNOSIS — D689 Coagulation defect, unspecified: Secondary | ICD-10-CM | POA: Diagnosis not present

## 2021-11-21 DIAGNOSIS — Q828 Other specified congenital malformations of skin: Secondary | ICD-10-CM | POA: Diagnosis not present

## 2021-11-21 NOTE — Progress Notes (Signed)
Subjective:   Patient ID: Joel [name], male   DOB: 76 y.o.   MRN: [id number]   HPI Patient presents with several chronic lesions bottom of right foot painful when pressed   ROS      Objective:  Physical Exam  Neurovascular status found to be intact with patient found to have keratotic lesion x4 plantar aspect right     Assessment:  Chronic painful lesion plantar right porokeratotic and appearance     Plan:  Sharp sterile debridement of all lesions no angiogenic bleeding reappoint routine care

## 2021-11-26 ENCOUNTER — Ambulatory Visit: Payer: Medicare HMO | Attending: Cardiovascular Disease

## 2021-11-26 DIAGNOSIS — I480 Paroxysmal atrial fibrillation: Secondary | ICD-10-CM | POA: Diagnosis not present

## 2021-11-26 DIAGNOSIS — I495 Sick sinus syndrome: Secondary | ICD-10-CM

## 2021-11-27 LAB — BASIC METABOLIC PANEL
BUN/Creatinine Ratio: 15 (ref 10–24)
BUN: 13 mg/dL (ref 8–27)
CO2: 25 mmol/L (ref 20–29)
Calcium: 9.4 mg/dL (ref 8.6–10.2)
Chloride: 102 mmol/L (ref 96–106)
Creatinine, Ser: 0.89 mg/dL (ref 0.76–1.27)
Glucose: 96 mg/dL (ref 70–99)
Potassium: 4.4 mmol/L (ref 3.5–5.2)
Sodium: 139 mmol/L (ref 134–144)
eGFR: 90 mL/min/{1.73_m2} (ref >59)

## 2021-11-27 LAB — CBC WITH DIFFERENTIAL/PLATELET
Basophils Absolute: 0.1 10*3/uL (ref 0.0–0.2)
Basos: 1 %
EOS (ABSOLUTE): 0.7 10*3/uL — ABNORMAL HIGH (ref 0.0–0.4)
Eos: 8 %
Hematocrit: 41.5 % (ref 37.5–51.0)
Hemoglobin: 14.4 g/dL (ref 13.0–17.7)
Immature Grans (Abs): 0 10*3/uL (ref 0.0–0.1)
Immature Granulocytes: 0 %
Lymphocytes Absolute: 3.4 10*3/uL — ABNORMAL HIGH (ref 0.7–3.1)
Lymphs: 40 %
MCH: 34 pg — ABNORMAL HIGH (ref 26.6–33.0)
MCHC: 34.7 g/dL (ref 31.5–35.7)
MCV: 98 fL — ABNORMAL HIGH (ref 79–97)
Monocytes Absolute: 0.7 10*3/uL (ref 0.1–0.9)
Monocytes: 8 %
Neutrophils Absolute: 3.7 10*3/uL (ref 1.4–7.0)
Neutrophils: 43 %
Platelets: 252 10*3/uL (ref 150–450)
RBC: 4.23 x10E6/uL (ref 4.14–5.80)
RDW: 12.1 % (ref 11.6–15.4)
WBC: 8.5 10*3/uL (ref 3.4–10.8)

## 2021-12-05 ENCOUNTER — Ambulatory Visit: Payer: Medicare HMO | Admitting: Podiatry

## 2021-12-05 ENCOUNTER — Other Ambulatory Visit: Payer: Self-pay | Admitting: Internal Medicine

## 2021-12-05 DIAGNOSIS — E785 Hyperlipidemia, unspecified: Secondary | ICD-10-CM

## 2021-12-10 DIAGNOSIS — I495 Sick sinus syndrome: Secondary | ICD-10-CM | POA: Diagnosis not present

## 2021-12-10 DIAGNOSIS — I480 Paroxysmal atrial fibrillation: Secondary | ICD-10-CM | POA: Diagnosis not present

## 2021-12-11 ENCOUNTER — Telehealth (HOSPITAL_COMMUNITY): Payer: Self-pay | Admitting: Emergency Medicine

## 2021-12-11 NOTE — Telephone Encounter (Signed)
Reaching out to patient to offer assistance regarding upcoming cardiac imaging study; pt verbalizes understanding of appt date/time, parking situation and where to check in, pre-test NPO status and medications ordered, and verified current allergies; name and call back number provided for further questions should they arise Travanti Mcmanus RN Navigator Cardiac Imaging [facility] [phone] office [phone] cell 

## 2021-12-12 ENCOUNTER — Ambulatory Visit (HOSPITAL_BASED_OUTPATIENT_CLINIC_OR_DEPARTMENT_OTHER)
Admission: RE | Admit: 2021-12-12 | Discharge: 2021-12-12 | Disposition: A | Discharge status: Home / Self Care | Payer: Medicare HMO | Source: Ambulatory Visit | Attending: Cardiovascular Disease | Admitting: Cardiovascular Disease

## 2021-12-12 ENCOUNTER — Encounter (HOSPITAL_BASED_OUTPATIENT_CLINIC_OR_DEPARTMENT_OTHER): Payer: Self-pay

## 2021-12-12 DIAGNOSIS — I495 Sick sinus syndrome: Secondary | ICD-10-CM | POA: Insufficient documentation

## 2021-12-12 DIAGNOSIS — I480 Paroxysmal atrial fibrillation: Secondary | ICD-10-CM | POA: Insufficient documentation

## 2021-12-12 MED ORDER — IOHEXOL 350 MG/ML SOLN
100.0000 mL | Freq: Once | INTRAVENOUS | Status: AC | PRN
  Administered 2021-12-12: 80 mL via INTRAVENOUS

## 2021-12-18 NOTE — Pre-Procedure Instructions (Signed)
Instructed patient on the following items: °Arrival time 0930 °Nothing to eat or drink after midnight °No meds AM of procedure °Responsible person to drive you home and stay with you for 24 hrs ° °Have you missed any doses of anti-coagulant Eliquis- hasn't missed any doses °   °

## 2021-12-19 ENCOUNTER — Ambulatory Visit (HOSPITAL_COMMUNITY): Payer: Medicare HMO | Admitting: Anesthesiology

## 2021-12-19 ENCOUNTER — Ambulatory Visit (HOSPITAL_COMMUNITY)
Admission: RE | Admit: 2021-12-19 | Discharge: 2021-12-19 | Disposition: A | Discharge status: Home / Self Care | Payer: Medicare HMO | Attending: Cardiovascular Disease | Admitting: Cardiovascular Disease

## 2021-12-19 ENCOUNTER — Other Ambulatory Visit: Payer: Self-pay

## 2021-12-19 ENCOUNTER — Encounter (HOSPITAL_COMMUNITY): Payer: Self-pay | Admitting: Cardiovascular Disease

## 2021-12-19 ENCOUNTER — Encounter (HOSPITAL_COMMUNITY)
Admission: RE | Disposition: A | Discharge status: Home / Self Care | Payer: Self-pay | Source: Home / Self Care | Attending: Cardiovascular Disease

## 2021-12-19 ENCOUNTER — Other Ambulatory Visit (HOSPITAL_COMMUNITY): Payer: Self-pay

## 2021-12-19 ENCOUNTER — Ambulatory Visit (HOSPITAL_BASED_OUTPATIENT_CLINIC_OR_DEPARTMENT_OTHER): Payer: Medicare HMO | Admitting: Anesthesiology

## 2021-12-19 DIAGNOSIS — I495 Sick sinus syndrome: Secondary | ICD-10-CM | POA: Diagnosis not present

## 2021-12-19 DIAGNOSIS — I480 Paroxysmal atrial fibrillation: Secondary | ICD-10-CM | POA: Insufficient documentation

## 2021-12-19 DIAGNOSIS — Z79899 Other long term (current) drug therapy: Secondary | ICD-10-CM | POA: Diagnosis not present

## 2021-12-19 DIAGNOSIS — R69 Illness, unspecified: Secondary | ICD-10-CM | POA: Diagnosis not present

## 2021-12-19 DIAGNOSIS — M1990 Unspecified osteoarthritis, unspecified site: Secondary | ICD-10-CM

## 2021-12-19 DIAGNOSIS — I4892 Unspecified atrial flutter: Secondary | ICD-10-CM | POA: Insufficient documentation

## 2021-12-19 DIAGNOSIS — Z7901 Long term (current) use of anticoagulants: Secondary | ICD-10-CM | POA: Diagnosis not present

## 2021-12-19 DIAGNOSIS — I4891 Unspecified atrial fibrillation: Secondary | ICD-10-CM

## 2021-12-19 DIAGNOSIS — I483 Typical atrial flutter: Secondary | ICD-10-CM | POA: Diagnosis not present

## 2021-12-19 DIAGNOSIS — D6869 Other thrombophilia: Secondary | ICD-10-CM | POA: Insufficient documentation

## 2021-12-19 DIAGNOSIS — F419 Anxiety disorder, unspecified: Secondary | ICD-10-CM

## 2021-12-19 HISTORY — PX: ATRIAL FIBRILLATION ABLATION: EP1191

## 2021-12-19 LAB — POCT ACTIVATED CLOTTING TIME
Activated Clotting Time: 293 seconds
Activated Clotting Time: 282 seconds
Activated Clotting Time: 309 seconds

## 2021-12-19 SURGERY — ATRIAL FIBRILLATION ABLATION
Anesthesia: General

## 2021-12-19 MED ORDER — HEPARIN (PORCINE) IN NACL 1000-0.9 UT/500ML-% IV SOLN
INTRAVENOUS | Status: DC | PRN
  Administered 2021-12-19 (×4): 500 mL

## 2021-12-19 MED ORDER — DEXAMETHASONE SODIUM PHOSPHATE 10 MG/ML IJ SOLN
INTRAMUSCULAR | Status: DC | PRN
  Administered 2021-12-19: 10 mg via INTRAVENOUS

## 2021-12-19 MED ORDER — COLCHICINE 0.6 MG PO TABS
0.6000 mg | ORAL_TABLET | Freq: Two times a day (BID) | ORAL | 0 refills | Status: DC
  Filled 2021-12-19: qty 10, 5d supply, fill #0

## 2021-12-19 MED ORDER — SODIUM CHLORIDE 0.9 % IV SOLN: INTRAVENOUS | Status: DC

## 2021-12-19 MED ORDER — SODIUM CHLORIDE 0.9 % IV SOLN: 250.0000 mL | INTRAVENOUS | Status: DC | PRN

## 2021-12-19 MED ORDER — ONDANSETRON HCL 4 MG/2ML IJ SOLN
INTRAMUSCULAR | Status: DC | PRN
  Administered 2021-12-19: 4 mg via INTRAVENOUS

## 2021-12-19 MED ORDER — PROPOFOL 10 MG/ML IV BOLUS
INTRAVENOUS | Status: DC | PRN
  Administered 2021-12-19: 140 mg via INTRAVENOUS
  Administered 2021-12-19: 60 mg via INTRAVENOUS

## 2021-12-19 MED ORDER — HEPARIN SODIUM (PORCINE) 1000 UNIT/ML IJ SOLN
INTRAMUSCULAR | Status: AC
  Filled 2021-12-19: qty 10

## 2021-12-19 MED ORDER — HEPARIN SODIUM (PORCINE) 1000 UNIT/ML IJ SOLN
INTRAMUSCULAR | Status: DC | PRN
  Administered 2021-12-19: 2000 [IU] via INTRAVENOUS
  Administered 2021-12-19: 14000 [IU] via INTRAVENOUS

## 2021-12-19 MED ORDER — SUGAMMADEX SODIUM 200 MG/2ML IV SOLN
INTRAVENOUS | Status: DC | PRN
  Administered 2021-12-19: 200 mg via INTRAVENOUS

## 2021-12-19 MED ORDER — PROTAMINE SULFATE 10 MG/ML IV SOLN
INTRAVENOUS | Status: DC | PRN
  Administered 2021-12-19: 30 mg via INTRAVENOUS

## 2021-12-19 MED ORDER — ACETAMINOPHEN 325 MG PO TABS: 650.0000 mg | ORAL_TABLET | ORAL | Status: DC | PRN

## 2021-12-19 MED ORDER — SODIUM CHLORIDE 0.9% FLUSH: 3.0000 mL | Freq: Two times a day (BID) | INTRAVENOUS | Status: DC

## 2021-12-19 MED ORDER — FENTANYL CITRATE (PF) 250 MCG/5ML IJ SOLN
INTRAMUSCULAR | Status: DC | PRN
  Administered 2021-12-19 (×2): 50 ug via INTRAVENOUS

## 2021-12-19 MED ORDER — PHENYLEPHRINE HCL-NACL 20-0.9 MG/250ML-% IV SOLN
INTRAVENOUS | Status: DC | PRN
  Administered 2021-12-19: 40 ug/min via INTRAVENOUS

## 2021-12-19 MED ORDER — DOBUTAMINE INFUSION FOR EP/ECHO/NUC (1000 MCG/ML)
INTRAVENOUS | Status: DC | PRN
  Administered 2021-12-19: 20 ug/kg/min via INTRAVENOUS

## 2021-12-19 MED ORDER — HEPARIN SODIUM (PORCINE) 1000 UNIT/ML IJ SOLN
INTRAMUSCULAR | Status: DC | PRN
  Administered 2021-12-19: 1000 [IU] via INTRAVENOUS

## 2021-12-19 MED ORDER — LORAZEPAM 2 MG/ML IJ SOLN: 1.0000 mg | INTRAMUSCULAR | Status: DC | PRN

## 2021-12-19 MED ORDER — ALPRAZOLAM 0.25 MG PO TABS: 0.2500 mg | ORAL_TABLET | Freq: Two times a day (BID) | ORAL | Status: DC | PRN

## 2021-12-19 MED ORDER — HEPARIN (PORCINE) IN NACL 2000-0.9 UNIT/L-% IV SOLN
INTRAVENOUS | Status: AC
  Filled 2021-12-19: qty 1000

## 2021-12-19 MED ORDER — LIDOCAINE 2% (20 MG/ML) 5 ML SYRINGE
INTRAMUSCULAR | Status: DC | PRN
  Administered 2021-12-19: 80 mg via INTRAVENOUS

## 2021-12-19 MED ORDER — DOBUTAMINE INFUSION FOR EP/ECHO/NUC (1000 MCG/ML)
INTRAVENOUS | Status: AC
  Filled 2021-12-19: qty 250

## 2021-12-19 MED ORDER — PHENYLEPHRINE 80 MCG/ML (10ML) SYRINGE FOR IV PUSH (FOR BLOOD PRESSURE SUPPORT)
PREFILLED_SYRINGE | INTRAVENOUS | Status: DC | PRN
  Administered 2021-12-19: 80 ug via INTRAVENOUS

## 2021-12-19 MED ORDER — ROCURONIUM BROMIDE 10 MG/ML (PF) SYRINGE
PREFILLED_SYRINGE | INTRAVENOUS | Status: DC | PRN
  Administered 2021-12-19: 30 mg via INTRAVENOUS
  Administered 2021-12-19: 50 mg via INTRAVENOUS

## 2021-12-19 MED ORDER — PANTOPRAZOLE SODIUM 40 MG PO TBEC
40.0000 mg | DELAYED_RELEASE_TABLET | Freq: Every day | ORAL | 0 refills | Status: AC
  Filled 2021-12-19: qty 45, 45d supply, fill #0

## 2021-12-19 MED ORDER — SODIUM CHLORIDE 0.9% FLUSH: 3.0000 mL | INTRAVENOUS | Status: DC | PRN

## 2021-12-19 MED ORDER — ONDANSETRON HCL 4 MG/2ML IJ SOLN: 4.0000 mg | Freq: Four times a day (QID) | INTRAMUSCULAR | Status: DC | PRN

## 2021-12-19 MED ORDER — HEPARIN (PORCINE) IN NACL 1000-0.9 UT/500ML-% IV SOLN
INTRAVENOUS | Status: AC
  Filled 2021-12-19: qty 2000

## 2021-12-19 SURGICAL SUPPLY — 19 items
CATH 8FR REPROCESSED SOUNDSTAR (CATHETERS) ×1 IMPLANT
CATH 8FR SOUNDSTAR REPROCESSED (CATHETERS) IMPLANT
CATH ABLAT QDOT MICRO BI TC FJ (CATHETERS) IMPLANT
CATH OCTARAY 2.0 F 3-3-3-3-3 (CATHETERS) IMPLANT
CATH PIGTAIL STEERABLE D1 8.7 (WIRE) IMPLANT
CATH S-M CIRCA TEMP PROBE (CATHETERS) IMPLANT
CATH WEB BI DIR CSDF CRV REPRO (CATHETERS) IMPLANT
CLOSURE PERCLOSE PROSTYLE (VASCULAR PRODUCTS) IMPLANT
COVER SWIFTLINK CONNECTOR (BAG) ×2 IMPLANT
DEVICE CLOSURE MYNXGRIP 6/7F (Vascular Products) IMPLANT
PACK EP LATEX FREE (CUSTOM PROCEDURE TRAY) ×1
PACK EP LF (CUSTOM PROCEDURE TRAY) ×2 IMPLANT
PAD DEFIB RADIO PHYSIO CONN (PAD) ×2 IMPLANT
PATCH CARTO3 (PAD) IMPLANT
SHEATH CARTO VIZIGO SM CVD (SHEATH) IMPLANT
SHEATH PINNACLE 8F 10CM (SHEATH) IMPLANT
SHEATH PINNACLE 9F 10CM (SHEATH) IMPLANT
SHEATH PROBE COVER 6X72 (BAG) IMPLANT
TUBING SMART ABLATE COOLFLOW (TUBING) IMPLANT

## 2021-12-19 NOTE — Anesthesia Procedure Notes (Signed)
Procedure Name: Intubation Date/Time: 12/19/2021 11:22 AM  Performed by: Betha Loa, CRNAPre-anesthesia Checklist: Patient identified, Emergency Drugs available, Suction available and Patient being monitored Patient Re-evaluated:Patient Re-evaluated prior to induction Oxygen Delivery Method: Circle System Utilized Preoxygenation: Pre-oxygenation with 100% oxygen Induction Type: IV induction Ventilation: Mask ventilation without difficulty Laryngoscope Size: Mac, 4 and Glidescope Grade View: Grade I Tube type: Oral Tube size: 7.5 mm Number of attempts: 1 Airway Equipment and Method: Rigid stylet and Video-laryngoscopy Placement Confirmation: ETT inserted through vocal cords under direct vision, positive ETCO2 and breath sounds checked- equal and bilateral Secured at: 22 cm Tube secured with: Tape Dental Injury: Teeth and Oropharynx as per pre-operative assessment  Difficulty Due To: Difficulty was anticipated, Difficult Airway- due to anterior larynx and Difficult Airway- due to reduced neck mobility Comments: Pt w history of cervical plate and states not comfortable to lie flat.  Pt positioned with 2 pillows under head/neck for support--pt reports comfort and no discomfort in neck/arms/head.  Pt to remain in this position for the case.

## 2021-12-19 NOTE — Anesthesia Preprocedure Evaluation (Addendum)
Anesthesia Evaluation  Patient identified by MRN, date of birth, ID band Patient awake    Reviewed: Allergy & Precautions, NPO status , Patient's Chart, lab work & pertinent test results  History of Anesthesia Complications (+) Family history of anesthesia reaction and history of anesthetic complications (mother developed dementia after surgery)  Airway Mallampati: III  TM Distance: >3 FB Neck ROM: Limited    Dental no notable dental hx.    Pulmonary neg shortness of breath, neg sleep apnea, neg COPD, neg recent URI, former smoker   Pulmonary exam normal breath sounds clear to auscultation       Cardiovascular (-) hypertension(-) angina (-) Past MI, (-) Cardiac Stents and (-) CABG + dysrhythmias Atrial Fibrillation  Rhythm:Regular Rate:Normal  HLD   Neuro/Psych  PSYCHIATRIC DISORDERS Anxiety     TIA   GI/Hepatic Neg liver ROS,GERD  ,,  Endo/Other  negative endocrine ROS    Renal/GU negative Renal ROS     Musculoskeletal  (+) Arthritis ,    Abdominal  (+) - obese  Peds  Hematology negative hematology ROS (+)   Anesthesia Other Findings   Reproductive/Obstetrics                              Anesthesia Physical Anesthesia Plan  ASA: 3  Anesthesia Plan: General   Post-op Pain Management:    Induction: Intravenous  PONV Risk Score and Plan: 2 and Ondansetron, Dexamethasone and Treatment may vary due to age or medical condition  Airway Management Planned: Oral ETT  Additional Equipment:   Intra-op Plan:   Post-operative Plan: Extubation in OR  Informed Consent: I have reviewed the patients History and Physical, chart, labs and discussed the procedure including the risks, benefits and alternatives for the proposed anesthesia with the patient or authorized representative who has indicated his/her understanding and acceptance.     Dental advisory given  Plan Discussed with: CRNA  and Anesthesiologist  Anesthesia Plan Comments: (Risks of general anesthesia discussed including, but not limited to, sore throat, hoarse voice, chipped/damaged teeth, injury to vocal cords, nausea and vomiting, allergic reactions, lung infection, heart attack, stroke, and death. All questions answered. )         Anesthesia Quick Evaluation

## 2021-12-19 NOTE — Discharge Instructions (Signed)

## 2021-12-19 NOTE — Transfer of Care (Signed)
Immediate Anesthesia Transfer of Care Note  Patient: Joel [name]  Procedure(s) Performed: ATRIAL FIBRILLATION ABLATION  Patient Location: PACU and Cath Lab  Anesthesia Type:General  Level of Consciousness: awake, patient cooperative, and responds to stimulation  Airway & Oxygen Therapy: Patient Spontanous Breathing and Patient connected to nasal cannula oxygen  Post-op Assessment: Report given to RN and Post -op Vital signs reviewed and stable  Post vital signs: Reviewed and stable  Last Vitals:  Vitals Value Taken Time  BP    Temp    Pulse    Resp    SpO2      Last Pain:  Vitals:   12/19/21 0952  TempSrc: Temporal  PainSc:          Complications:  Encounter Notable Events  Notable Event Outcome Phase Comment  Difficult to intubate - expected  Intraprocedure Filed from anesthesia note documentation.

## 2021-12-19 NOTE — H&P (Signed)
Cardiology Office Note:    Date:  12/19/2021   ID:  Joel [name], DOB 1945-08-04, MRN [id number]  PCP:  Crist Infante, [place] [facility] Cardiologist:  Cristopher Peru, MD     Referring MD: Myles Gip Yetta Barre, MD   Chief complaint: palpitations  History of Present Illness:    Joel [name] is a 76 y.o. male with a hx of sinus node dysfunction and atrial fibrillation referred by Dr. Lovena Le for arrhythmia management.  AF is documented on an ECG from 10/14/2009 under the media tab.  His AF had been reasonably well-managed on flecainide, but he has been having increased episodes. He was referred to discuss ablation. He has palpitations during episodes and can sense an irregular pulse. He has not had an EKG during an episode, to his recollection, for some time.  He is doing well otherwise, no chest pain, dyspnea, dependent edema.  I reviewed the patient's CT and labs. There was not LAA thrombus. he  has not missed any doses of anticoagulation, and he took his dose last night. There have been no changes in the patient's diagnoses, medications, or condition since our recent clinic visit.  Since my last visit with the patient, he wore a monitor that showed atrial fibrillation and flutter with some post-conversion pauses that are the likely cause of his pre-syncope symptoms.    Past Medical History:  Diagnosis Date   Allergy    Anxiety    Arthritis    Atrial fibrillation ([place])    Benign prostatic hypertrophy    Dysrhythmia    Family history of anesthesia complication    "MOTHER DEVELOPED DEMENTIA AFTER SURGERY"   Fundic gland polyps of stomach, benign with low-grade dysplasia 02/20/2018   Gastric polyps - hyperplastic 02/20/2018   GERD (gastroesophageal reflux disease)    INTERMITANT -NO MEDS   Hx of adenomatous polyp of colon 05/23/2009   Hypercholesterolemia    Memory loss    Nocturia     Past Surgical History:  Procedure Laterality Date    APPENDECTOMY     COLONOSCOPY W/ POLYPECTOMY     COLOSTOMY     FISTULOTOMY N/A 08/12/2018   Procedure: FISTULOTOMY;  Surgeon: Leighton Ruff, MD;  Location: [place];  Service: General;  Laterality: N/A;   INCISION AND DRAINAGE ABSCESS N/A 03/25/2018   Procedure: INCISION AND DRAINAGE PERI-RECTAL ABSCESS;  Surgeon: Donnie Mesa, MD;  Location: [place];  Service: General;  Laterality: N/A;   PLATE NECK  3300   TITANIUM PLATE IN NECK   RECTAL EXAM UNDER ANESTHESIA N/A 08/12/2018   Procedure: ANAL EXAM UNDER ANESTHESIA;  Surgeon: Leighton Ruff, MD;  Location: [place];  Service: General;  Laterality: N/A;   TRANSURETHRAL RESECTION OF PROSTATE N/A 08/06/2013   Procedure: TRANSURETHRAL RESECTION OF THE PROSTATE (TURP) WITH GYRUS, irrigation of bladder stone, cystoscope;  Surgeon: Ailene Rud, MD;  Location: [facility];  Service: Urology;  Laterality: N/A;    Current Medications: Current Meds  Medication Sig   acetaminophen (TYLENOL) 500 MG tablet Take 500-1,000 mg by mouth every 6 (six) hours as needed for moderate pain.   apixaban (ELIQUIS) 5 MG TABS tablet TAKE 1 TABLET BY MOUTH TWICE A DAY   atorvastatin (LIPITOR) 10 MG tablet Take 1 tablet (10 mg total) by mouth daily.   Carboxymethylcellul-Glycerin (LUBRICATING EYE DROPS OP) Place 1 drop into both eyes daily as needed (dry eyes).   Coenzyme Q10 (COQ-10) 100 MG CAPS Take 100  mg by mouth daily.   flecainide (TAMBOCOR) 100 MG tablet TAKE 1 TAB BY MOUTH TWICE A DAY ALSO TAKE 1/2 TAB AS NEEDED FOR AFIB   metoprolol succinate (TOPROL-XL) 25 MG 24 hr tablet TAKE 1/2 TABLET BY MOUTH EVERY DAY (Patient taking differently: Take 12.5 mg by mouth as needed.)   Multiple Vitamin (MULTIVITAMIN) capsule Take 1 capsule by mouth daily.   omeprazole (PRILOSEC) 20 MG capsule Take 20 mg by mouth daily as needed (acid reflux).   pantoprazole (PROTONIX) 40 MG tablet Take 1 tablet (40 mg total) by mouth daily before breakfast.  (Patient taking differently: Take 40 mg by mouth daily as needed (reflux).)   valACYclovir (VALTREX) 1000 MG tablet Take 1,000 mg by mouth 3 (three) times daily as needed (shingles).     Allergies:   Penicillins and Ciprofloxacin   Social History   Socioeconomic History   Marital status: Married    Spouse name: Bethena Roys   Number of children: 0   Years of education: Not on file   Highest education level: Not on file  Occupational History   Occupation: psychologist    Employer: [employer]  Tobacco Use   Smoking status: Former    Packs/day: 1.00    Types: Cigarettes    Quit date: 01/21/1990    Years since quitting: 31.9   Smokeless tobacco: Never  Vaping Use   Vaping Use: Never used  Substance and Sexual Activity   Alcohol use: Yes    Alcohol/week: 8.0 standard drinks of alcohol    Types: 8 Glasses of wine per week    Comment: 2 DRINKS 4-5 TIMES PER WK   Drug use: No   Sexual activity: Not on file  Other Topics Concern   Not on file  Social History Narrative   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, water   Former smoker, about 8 alcoholic beverages a week no drug use   Social Determinants of Radio broadcast assistant Strain: Not on file  Food Insecurity: Not on file  Transportation Needs: Not on file  Physical Activity: Not on file  Stress: Not on file  Social Connections: Not on file     Family History: The patient's family history includes Arrhythmia in his father; Colon cancer in his sister; Esophageal cancer in an other family member; GER disease in his mother and sister; Hyperlipidemia in his mother; Hypertension in his mother and sister; Other in his sister; Rectal cancer in an other family member; Stomach cancer in an other family member; Stroke in his father.  ROS:   Please see the history of present illness.    All other systems reviewed and are negative.  EKGs/Labs/Other Studies Reviewed:     Monitor ordered today  EKG:  Last EKG results:  Sinus rhythm 70 bpm   Recent Labs: 11/26/2021: BUN 13; Creatinine, Ser 0.89; Hemoglobin 14.4; Platelets 252; Potassium 4.4; Sodium 139    Risk Assessment/Calculations:    CHA2DS2-VASc Score = 3   This indicates a 3.2% annual risk of stroke. The patient's score is based upon: CHF History: 0 HTN History: 0 Diabetes History: 0 Stroke History: 2 Vascular Disease History: 0 Age Score: 1 Gender Score: 0     Physical Exam:    VS:  BP 137/87   Pulse 82   Temp 98 F (36.7 C) (Temporal)   Resp 18   Ht '6\' 3"'$  (1.905 m)   Wt 83.9 kg   SpO2 98%   BMI 23.12  kg/m     Wt Readings from Last 3 Encounters:  12/19/21 83.9 kg  11/12/21 84.4 kg  10/31/21 82.1 kg     GEN:  Well nourished, well developed in no acute distress CARDIAC: brady, regular; no murmurs, rubs, gallops RESPIRATORY:  Normal work of breathing MUSCULOSKELETAL: no edema    ASSESSMENT & PLAN:    Atrial fibrillation: Paroxysmal. failed flecainide. Recent monitor showed fibrillation and flutter with a burden of about 30%. Post conversion pauses were also noted.  2.   High risk medication: on flecainide. Hopefully, we will be able to discontinue this after ablation. 3.   Secondary hypercoagulable state: continue eliquis for CHADS2VASC of 3         Medication Adjustments/Labs and Tests Ordered: Current medicines are reviewed at length with the patient today.  Concerns regarding medicines are outlined above.  Orders Placed This Encounter  Procedures   Informed Consent Details: Physician/Practitioner Attestation; Transcribe to consent form and obtain patient signature   Initiate Pre-op Protocol   [facility] on call to EP Lab   Confirm CBC and BMP (or CMP) results within 7 days for inpatient and 30 days for outpatient:   Clip right and left femoral area PM before surgery   Clip right internal jugular area PM before surgery   Pre-admission testing diagnosis   EP STUDY   Insert peripheral IV   Meds ordered this  encounter  Medications   0.9 %  sodium chloride infusion     Signed, Melida Quitter, MD  12/19/2021 10:38 AM    [place]

## 2021-12-19 NOTE — Anesthesia Postprocedure Evaluation (Signed)
Anesthesia Post Note  Patient: Caidin Heidenreich Kowalewski  Procedure(s) Performed: ATRIAL FIBRILLATION ABLATION     Patient location during evaluation: PACU Anesthesia Type: General Level of consciousness: awake Pain management: pain level controlled Vital Signs Assessment: post-procedure vital signs reviewed and stable Respiratory status: spontaneous breathing, nonlabored ventilation and respiratory function stable Cardiovascular status: blood pressure returned to baseline and stable Postop Assessment: no apparent nausea or vomiting Anesthetic complications: yes   Encounter Notable Events  Notable Event Outcome Phase Comment  Difficult to intubate - expected  Intraprocedure Filed from anesthesia note documentation.    Last Vitals:  Vitals:   12/19/21 1600 12/19/21 1721  BP: 132/82 139/80  Pulse: 86 61  Resp: 13 16  Temp:  (!) 36.4 C  SpO2: 97% 97%    Last Pain:  Vitals:   12/19/21 1721  TempSrc: Oral  PainSc:                  Nilda Simmer

## 2021-12-20 ENCOUNTER — Encounter (HOSPITAL_COMMUNITY): Payer: Self-pay | Admitting: Cardiovascular Disease

## 2021-12-23 ENCOUNTER — Telehealth: Payer: Self-pay | Admitting: Home Health

## 2021-12-23 ENCOUNTER — Encounter: Payer: Self-pay | Admitting: Cardiovascular Disease

## 2021-12-23 NOTE — Telephone Encounter (Signed)
Patient called reports new onset of rash noted on his bilateral thighs, 6in x6in, red and raised, non-pruritus or painful. Rash is localized, started yesterday. He is wondering if's due to new med colchicine prescribed after his ablation.  No generalized body rash or other symptoms. Query if true allergic reaction. Advised the patient to continue monitor rash, if progressing with spread and increasing itchy/painful, skin peeling, need to stop colchicine and take benadryl. Call back if more concern and questions.

## 2021-12-24 NOTE — Telephone Encounter (Signed)
See MyChart message

## 2022-01-11 ENCOUNTER — Other Ambulatory Visit: Payer: Medicare HMO

## 2022-01-16 ENCOUNTER — Ambulatory Visit (HOSPITAL_COMMUNITY)
Admission: RE | Admit: 2022-01-16 | Discharge: 2022-01-16 | Disposition: A | Discharge status: Home / Self Care | Payer: Medicare HMO | Source: Ambulatory Visit | Attending: Nurse Practitioner | Admitting: Nurse Practitioner

## 2022-01-16 VITALS — BP 150/82 | HR 63 | Ht 75.0 in | Wt 183.4 lb

## 2022-01-16 DIAGNOSIS — Z7901 Long term (current) use of anticoagulants: Secondary | ICD-10-CM | POA: Diagnosis not present

## 2022-01-16 DIAGNOSIS — I4891 Unspecified atrial fibrillation: Secondary | ICD-10-CM | POA: Diagnosis not present

## 2022-01-16 NOTE — Progress Notes (Signed)
Primary Care Physician: Crist Infante, MD Referring Physician:Dr. Mealor    Joel [name] is a 76 y.o. male with a h/o afib, s/p afib ablation 12/19/21. He reports since the procedure his new apple watch says that he has had less than 2% afib burden. No swallowing or groin issues. He is back to his usual activities. No interruption in anticoagulation.  Today, he denies symptoms of palpitations, chest pain, shortness of breath, orthopnea, PND, lower extremity edema, dizziness, presyncope, syncope, or neurologic sequela. The patient is tolerating medications without difficulties and is otherwise without complaint today.   Past Medical History:  Diagnosis Date   Allergy    Anxiety    Arthritis    Atrial fibrillation ([place])    Benign prostatic hypertrophy    Dysrhythmia    Family history of anesthesia complication    "MOTHER DEVELOPED DEMENTIA AFTER SURGERY"   Fundic gland polyps of stomach, benign with low-grade dysplasia 02/20/2018   Gastric polyps - hyperplastic 02/20/2018   GERD (gastroesophageal reflux disease)    INTERMITANT -NO MEDS   Hx of adenomatous polyp of colon 05/23/2009   Hypercholesterolemia    Memory loss    Nocturia    Past Surgical History:  Procedure Laterality Date   APPENDECTOMY     ATRIAL FIBRILLATION ABLATION N/A 12/19/2021   Procedure: ATRIAL FIBRILLATION ABLATION;  Surgeon: Melida Quitter, MD;  Location: [place] [facility];  Service: Cardiovascular;  Laterality: N/A;   COLONOSCOPY W/ POLYPECTOMY     COLOSTOMY     FISTULOTOMY N/A 08/12/2018   Procedure: FISTULOTOMY;  Surgeon: Leighton Ruff, MD;  Location: [place];  Service: General;  Laterality: N/A;   INCISION AND DRAINAGE ABSCESS N/A 03/25/2018   Procedure: INCISION AND DRAINAGE PERI-RECTAL ABSCESS;  Surgeon: Donnie Mesa, MD;  Location: [place];  Service: General;  Laterality: N/A;   PLATE NECK  0109   TITANIUM PLATE IN NECK   RECTAL EXAM UNDER ANESTHESIA N/A 08/12/2018    Procedure: ANAL EXAM UNDER ANESTHESIA;  Surgeon: Leighton Ruff, MD;  Location: [place];  Service: General;  Laterality: N/A;   TRANSURETHRAL RESECTION OF PROSTATE N/A 08/06/2013   Procedure: TRANSURETHRAL RESECTION OF THE PROSTATE (TURP) WITH GYRUS, irrigation of bladder stone, cystoscope;  Surgeon: Ailene Rud, MD;  Location: [facility];  Service: Urology;  Laterality: N/A;    Current Outpatient Medications  Medication Sig Dispense Refill   acetaminophen (TYLENOL) 500 MG tablet Take 500-1,000 mg by mouth every 6 (six) hours as needed for moderate pain.     apixaban (ELIQUIS) 5 MG TABS tablet TAKE 1 TABLET BY MOUTH TWICE A DAY 60 tablet 11   atorvastatin (LIPITOR) 10 MG tablet Take 1 tablet (10 mg total) by mouth daily. 90 tablet 3   Carboxymethylcellul-Glycerin (LUBRICATING EYE DROPS OP) Place 1 drop into both eyes daily as needed (dry eyes).     Coenzyme Q10 (COQ-10) 100 MG CAPS Take 100 mg by mouth daily.     metoprolol succinate (TOPROL-XL) 25 MG 24 hr tablet TAKE 1/2 TABLET BY MOUTH EVERY DAY (Patient taking differently: Take 12.5 mg by mouth as needed.) 45 tablet 3   Multiple Vitamin (MULTIVITAMIN) capsule Take 1 capsule by mouth daily.     omeprazole (PRILOSEC) 20 MG capsule Take 20 mg by mouth daily as needed (acid reflux).     pantoprazole (PROTONIX) 40 MG tablet Take 1 tablet (40 mg total) by mouth daily before breakfast. (Patient taking differently: Take 40 mg by mouth  daily as needed (reflux).) 30 tablet 2   pantoprazole (PROTONIX) 40 MG tablet Take 1 tablet (40 mg total) by mouth daily. 45 tablet 0   valACYclovir (VALTREX) 1000 MG tablet Take 1,000 mg by mouth 3 (three) times daily as needed (shingles).     No current facility-administered medications for this encounter.    Allergies  Allergen Reactions   Penicillins Hives and Rash    Did it involve swelling of the face/tongue/throat, SOB, or low BP? No Did it involve sudden or severe rash/hives, skin  peeling, or any reaction on the inside of your mouth or nose? Unk Did you need to seek medical attention at a hospital or doctor's office? Yes When did it last happen? 1970's If all above answers are "NO", may proceed with cephalosporin use.    Ciprofloxacin Other (See Comments)    Is currently taking Tambocor.Marland KitchenMarland KitchenINTERACTION??    Social History   Socioeconomic History   Marital status: Married    Spouse name: Bethena Roys   Number of children: 0   Years of education: Not on file   Highest education level: Not on file  Occupational History   Occupation: psychologist    Employer: [employer]  Tobacco Use   Smoking status: Former    Packs/day: 1.00    Types: Cigarettes    Quit date: 01/21/1990    Years since quitting: 32.0   Smokeless tobacco: Never  Vaping Use   Vaping Use: Never used  Substance and Sexual Activity   Alcohol use: Yes    Alcohol/week: 8.0 standard drinks of alcohol    Types: 8 Glasses of wine per week    Comment: 2 DRINKS 4-5 TIMES PER WK   Drug use: No   Sexual activity: Not on file  Other Topics Concern   Not on file  Social History Narrative   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, water   Former smoker, about 8 alcoholic beverages a week no drug use   Social Determinants of Radio broadcast assistant Strain: Not on file  Food Insecurity: Not on file  Transportation Needs: Not on file  Physical Activity: Not on file  Stress: Not on file  Social Connections: Not on file  Intimate Partner Violence: Not on file    Family History  Problem Relation Age of Onset   Hypertension Mother    Hyperlipidemia Mother    GER disease Mother    Arrhythmia Father    Stroke Father        38s   Hypertension Sister    GER disease Sister    Other Sister        tumor removed from her colon, possible pre-cancerous, foot of her bowel removed   Colon cancer Sister    Esophageal cancer Other    Stomach cancer Other    Rectal cancer Other     ROS- All  systems are reviewed and negative except as per the HPI above  Physical Exam: Vitals:   01/16/22 1018  BP: (!) 150/82  Pulse: 63  Weight: 83.2 kg  Height: '6\' 3"'$  (1.905 m)   Wt Readings from Last 3 Encounters:  01/16/22 83.2 kg  12/19/21 83.9 kg  11/12/21 84.4 kg    Labs: Lab Results  Component Value Date   NA 139 11/26/2021   K 4.4 11/26/2021   CL 102 11/26/2021   CO2 25 11/26/2021   GLUCOSE 96 11/26/2021   BUN 13 11/26/2021   CREATININE 0.89 11/26/2021  CALCIUM 9.4 11/26/2021   Lab Results  Component Value Date   INR 1.31 10/14/2009   Lab Results  Component Value Date   CHOL (H) 10/12/2009    215        ATP III CLASSIFICATION:  <200     mg/dL   Desirable  200-239  mg/dL   Borderline High  >=240    mg/dL   High          HDL 42 10/12/2009   LDLCALC (H) 10/12/2009    148        Total Cholesterol/HDL:CHD Risk Coronary Heart Disease Risk Table                     Men   Women  1/2 Average Risk   3.4   3.3  Average Risk       5.0   4.4  2 X Average Risk   9.6   7.1  3 X Average Risk  23.4   11.0        Use the calculated Patient Ratio above and the CHD Risk Table to determine the patient's CHD Risk.        ATP III CLASSIFICATION (LDL):  <100     mg/dL   Optimal  100-129  mg/dL   Near or Above                    Optimal  130-159  mg/dL   Borderline  160-189  mg/dL   High  >190     mg/dL   Very High   TRIG 127 10/12/2009     GEN- The patient is well appearing, alert and oriented x 3 today.   Head- normocephalic, atraumatic Eyes-  Sclera clear, conjunctiva pink Ears- hearing intact Oropharynx- clear Neck- supple, no JVP Lymph- no cervical lymphadenopathy Lungs- Clear to ausculation bilaterally, normal work of breathing Heart- Regular rate and rhythm, no murmurs, rubs or gallops, PMI not laterally displaced GI- soft, NT, ND, + BS Extremities- no clubbing, cyanosis, or edema MS- no significant deformity or atrophy Skin- no rash or lesion Psych-  euthymic mood, full affect Neuro- strength and sensation are intact  EKG-Vent. rate 63 BPM PR interval 172 ms QRS duration 82 ms QT/QTcB 416/425 ms P-R-T axes 22 74 77 Normal sinus rhythm Normal ECG When compared with ECG of 19-Dec-2021 14:25, PREVIOUS ECG IS PRESENT    Assessment and Plan:  1. Afib  S/p afib ablation x one month and is doing well maintaining  SR  Continue metoprolol succinate 25 mg 1/2 daily   2. CHA2DS2VASc  score is at least 3 Continue eliquis 5 mg bid   Geroge Baseman. Chanel Mckesson, [facility] [address] [place], [place] [address] [phone]

## 2022-01-18 ENCOUNTER — Other Ambulatory Visit: Payer: Self-pay | Admitting: Internal Medicine

## 2022-01-18 NOTE — Telephone Encounter (Signed)
Prescription refill request for Eliquis received. Indication:Afib Last office visit:12/23 Scr:0.8 Age: 76 Weight:83.2  kg  Prescription refilled

## 2022-01-22 ENCOUNTER — Encounter: Payer: Self-pay | Admitting: Cardiovascular Disease

## 2022-01-22 DIAGNOSIS — H524 Presbyopia: Secondary | ICD-10-CM | POA: Diagnosis not present

## 2022-01-22 DIAGNOSIS — H5213 Myopia, bilateral: Secondary | ICD-10-CM | POA: Diagnosis not present

## 2022-01-22 DIAGNOSIS — H40053 Ocular hypertension, bilateral: Secondary | ICD-10-CM | POA: Diagnosis not present

## 2022-01-22 DIAGNOSIS — H25813 Combined forms of age-related cataract, bilateral: Secondary | ICD-10-CM | POA: Diagnosis not present

## 2022-01-28 DIAGNOSIS — Z7952 Long term (current) use of systemic steroids: Secondary | ICD-10-CM | POA: Diagnosis not present

## 2022-01-31 ENCOUNTER — Encounter: Payer: Self-pay | Admitting: Internal Medicine

## 2022-02-07 ENCOUNTER — Encounter: Payer: Self-pay | Admitting: Podiatry

## 2022-02-07 ENCOUNTER — Ambulatory Visit: Payer: Medicare HMO | Admitting: Podiatry

## 2022-02-07 DIAGNOSIS — Q828 Other specified congenital malformations of skin: Secondary | ICD-10-CM

## 2022-02-07 DIAGNOSIS — M7751 Other enthesopathy of right foot: Secondary | ICD-10-CM | POA: Diagnosis not present

## 2022-02-07 MED ORDER — TRIAMCINOLONE ACETONIDE 10 MG/ML IJ SUSP
10.0000 mg | Freq: Once | INTRAMUSCULAR | Status: AC
  Administered 2022-02-07: 10 mg

## 2022-02-07 NOTE — Progress Notes (Signed)
Subjective:   Patient ID: Joel [name], male   DOB: 77 y.o.   MRN: [id number]   HPI Patient presents inflammation of the third digit right with painful lesion also noted and also lesion on the left that is painful when pressed   ROS      Objective:  Physical Exam  Neurovascular status intact inflammation fluid around the plantar interphalange heel joint digit 3 right with new keratotic tissue formation lateral foot left around the fifth metatarsal head painful     Assessment:  Inflammatory capsulitis third digit right plantar along with porokeratotic lesion painful when pressed left     Plan:  H&P educated on porokeratotic lesion that is just new and has just started over the last couple months and did courtesy debridement to take pressure off of it.  Went ahead did sterile prep and injected the inner phalangeal joint plantar right 3 mg Dexasone Kenalog 5 mg Xylocaine debrided out the lesion courtesy and reappoint to recheck as needed

## 2022-02-12 ENCOUNTER — Encounter: Payer: Self-pay | Admitting: Cardiovascular Disease

## 2022-02-12 ENCOUNTER — Other Ambulatory Visit: Payer: Medicare HMO

## 2022-02-20 ENCOUNTER — Other Ambulatory Visit: Payer: Self-pay | Admitting: Internal Medicine

## 2022-02-28 ENCOUNTER — Encounter (HOSPITAL_COMMUNITY): Payer: Self-pay | Admitting: *Deleted

## 2022-03-05 DIAGNOSIS — G44209 Tension-type headache, unspecified, not intractable: Secondary | ICD-10-CM | POA: Diagnosis not present

## 2022-03-05 DIAGNOSIS — I480 Paroxysmal atrial fibrillation: Secondary | ICD-10-CM | POA: Diagnosis not present

## 2022-03-05 DIAGNOSIS — K219 Gastro-esophageal reflux disease without esophagitis: Secondary | ICD-10-CM | POA: Diagnosis not present

## 2022-03-05 DIAGNOSIS — M542 Cervicalgia: Secondary | ICD-10-CM | POA: Diagnosis not present

## 2022-03-05 DIAGNOSIS — Z7901 Long term (current) use of anticoagulants: Secondary | ICD-10-CM | POA: Diagnosis not present

## 2022-03-05 DIAGNOSIS — F419 Anxiety disorder, unspecified: Secondary | ICD-10-CM | POA: Diagnosis not present

## 2022-03-06 ENCOUNTER — Other Ambulatory Visit: Payer: Medicare HMO

## 2022-03-21 ENCOUNTER — Ambulatory Visit: Payer: Medicare HMO | Attending: Cardiovascular Disease | Admitting: Cardiovascular Disease

## 2022-03-21 ENCOUNTER — Encounter: Payer: Self-pay | Admitting: Cardiovascular Disease

## 2022-03-21 VITALS — BP 136/78 | HR 69 | Ht 75.0 in | Wt 181.2 lb

## 2022-03-21 DIAGNOSIS — I480 Paroxysmal atrial fibrillation: Secondary | ICD-10-CM

## 2022-03-21 NOTE — Progress Notes (Signed)
Cardiology Office Note:    Date:  03/21/2022   ID:  Joel [name], DOB 23-May-1945, MRN [id number]  PCP:  Crist Infante, [place] [facility] Cardiologist:  Cristopher Peru, MD     Referring MD: Crist Infante, MD   Chief complaint: palpitations  History of Present Illness:    Joel [name] is a 77 y.o. male with a hx of sinus node dysfunction and atrial fibrillation referred by Dr. Lovena Le for arrhythmia management.  AF is documented on an ECG from 10/14/2009 under the media tab.  His AF had been reasonably well-managed on flecainide, but he has been having increased episodes. He was referred to discuss ablation. He has palpitations during episodes and can sense an irregular pulse. He has not had an EKG during an episode, to his recollection, for some time.  He is doing well otherwise, no chest pain, dyspnea, dependent edema.  He underwent atrial fibrillation in November 2023.  He tolerated the procedure well, and there were no complications.  His Apple Watch has detected some episodes of A-fib in the early blinding period, but over the past few months has shown no A-fib.   Past Medical History:  Diagnosis Date   Allergy    Anxiety    Arthritis    Atrial fibrillation ([place])    Benign prostatic hypertrophy    Dysrhythmia    Family history of anesthesia complication    "MOTHER DEVELOPED DEMENTIA AFTER SURGERY"   Fundic gland polyps of stomach, benign with low-grade dysplasia 02/20/2018   Gastric polyps - hyperplastic 02/20/2018   GERD (gastroesophageal reflux disease)    INTERMITANT -NO MEDS   Hx of adenomatous polyp of colon 05/23/2009   Hypercholesterolemia    Memory loss    Nocturia     Past Surgical History:  Procedure Laterality Date   APPENDECTOMY     ATRIAL FIBRILLATION ABLATION N/A 12/19/2021   Procedure: ATRIAL FIBRILLATION ABLATION;  Surgeon: Melida Quitter, MD;  Location: [place] [facility];  Service: Cardiovascular;  Laterality: N/A;    COLONOSCOPY W/ POLYPECTOMY     COLOSTOMY     FISTULOTOMY N/A 08/12/2018   Procedure: FISTULOTOMY;  Surgeon: Leighton Ruff, MD;  Location: [place];  Service: General;  Laterality: N/A;   INCISION AND DRAINAGE ABSCESS N/A 03/25/2018   Procedure: INCISION AND DRAINAGE PERI-RECTAL ABSCESS;  Surgeon: Donnie Mesa, MD;  Location: [place];  Service: General;  Laterality: N/A;   PLATE NECK  [id number]   TITANIUM PLATE IN NECK   RECTAL EXAM UNDER ANESTHESIA N/A 08/12/2018   Procedure: ANAL EXAM UNDER ANESTHESIA;  Surgeon: Leighton Ruff, MD;  Location: [place];  Service: General;  Laterality: N/A;   TRANSURETHRAL RESECTION OF PROSTATE N/A 08/06/2013   Procedure: TRANSURETHRAL RESECTION OF THE PROSTATE (TURP) WITH GYRUS, irrigation of bladder stone, cystoscope;  Surgeon: Ailene Rud, MD;  [facility];  Service: Urology;  Laterality: N/A;    Current Medications: Current Meds  Medication Sig   acetaminophen (TYLENOL) 500 MG tablet Take 500-1,000 mg by mouth every 6 (six) hours as needed for moderate pain.   atorvastatin (LIPITOR) 10 MG tablet Take 1 tablet (10 mg total) by mouth daily.   Carboxymethylcellul-Glycerin (LUBRICATING EYE DROPS OP) Place 1 drop into both eyes daily as needed (dry eyes).   cetirizine (ZYRTEC) 10 MG tablet Take 10 mg by mouth as needed for allergies. During allergy season   Coenzyme Q10 (COQ-10) 100 MG CAPS Take 100 mg  by mouth daily.   ELIQUIS 5 MG TABS tablet TAKE 1 TABLET BY MOUTH TWICE A DAY   metoprolol succinate (TOPROL-XL) 25 MG 24 hr tablet Take 0.5 tablets (12.5 mg total) by mouth daily.   Multiple Vitamin (MULTIVITAMIN) capsule Take 1 capsule by mouth daily.   omeprazole (PRILOSEC) 20 MG capsule Take 20 mg by mouth daily as needed (acid reflux).   triamcinolone (NASACORT ALLERGY 24HR) 55 MCG/ACT AERO nasal inhaler Place 2 sprays into the nose as needed. During allergy season   valACYclovir (VALTREX) 1000 MG tablet Take 1,000  mg by mouth 3 (three) times daily as needed (shingles).     Allergies:   Penicillins and Ciprofloxacin   Social History   Socioeconomic History   Marital status: Married    Spouse name: Bethena Roys   Number of children: 0   Years of education: Not on file   Highest education level: Not on file  Occupational History   Occupation: psychologist    Employer: [employer]  Tobacco Use   Smoking status: Former    Packs/day: 1.00    Types: Cigarettes    Quit date: 01/21/1990    Years since quitting: 32.1   Smokeless tobacco: Never  Vaping Use   Vaping Use: Never used  Substance and Sexual Activity   Alcohol use: Yes    Alcohol/week: 8.0 standard drinks of alcohol    Types: 8 Glasses of wine per week    Comment: 2 DRINKS 4-5 TIMES PER WK   Drug use: No   Sexual activity: Not on file  Other Topics Concern   Not on file  Social History Narrative   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, water   Former smoker, about 8 alcoholic beverages a week no drug use   Social Determinants of Radio broadcast assistant Strain: Not on file  Food Insecurity: Not on file  Transportation Needs: Not on file  Physical Activity: Not on file  Stress: Not on file  Social Connections: Not on file     Family History: The patient's family history includes Arrhythmia in his father; Colon cancer in his sister; Esophageal cancer in an other family member; GER disease in his mother and sister; Hyperlipidemia in his mother; Hypertension in his mother and sister; Other in his sister; Rectal cancer in an other family member; Stomach cancer in an other family member; Stroke in his father.  ROS:   Please see the history of present illness.    All other systems reviewed and are negative.  EKGs/Labs/Other Studies Reviewed:     Monitor ordered today  EKG:  Last EKG results: Sinus rhythm 70 bpm   Recent Labs: 11/26/2021: BUN 13; Creatinine, Ser 0.89; Hemoglobin 14.4; Platelets 252; Potassium 4.4;  Sodium 139    Risk Assessment/Calculations:    CHA2DS2-VASc Score = 3   This indicates a 3.2% annual risk of stroke. The patient's score is based upon: CHF History: 0 HTN History: 0 Diabetes History: 0 Stroke History: 2 Vascular Disease History: 0 Age Score: 1 Gender Score: 0     Physical Exam:    VS:  BP 136/78   Pulse 69   Ht '6\' 3"'$  (1.905 m)   Wt 181 lb 3.2 oz (82.2 kg)   SpO2 97%   BMI 22.65 kg/m     Wt Readings from Last 3 Encounters:  03/21/22 181 lb 3.2 oz (82.2 kg)  01/16/22 183 lb 6.4 oz (83.2 kg)  12/19/21 185 lb (83.9 kg)  GEN:  Well nourished, well developed in no acute distress CARDIAC: brady, regular; no murmurs, rubs, gallops RESPIRATORY:  Normal work of breathing MUSCULOSKELETAL: no edema    ASSESSMENT & PLAN:    Atrial fibrillation: Paroxysmal. failed flecainide.  He is doing very well after ablation and remains in sinus rhythm.  2.   High risk medication: Flecainide has been discontinued. I advised him that if he does have recurrence of atrial fibrillation, he can take a metoprolol pill and if the rhythm does not resolve, a dose of flecainide.  I asked him to give [place] a call if this were to occur. 3.   Secondary hypercoagulable state: continue eliquis for CHADS2VASC of 3         Medication Adjustments/Labs and Tests Ordered: Current medicines are reviewed at length with the patient today.  Concerns regarding medicines are outlined above.  No orders of the defined types were placed in this encounter.  No orders of the defined types were placed in this encounter.    Signed, Melida Quitter, MD  03/21/2022 12:21 PM    [place]

## 2022-03-21 NOTE — Patient Instructions (Signed)
Medication Instructions:  Your physician recommends that you continue on your current medications as directed. Please refer to the Current Medication list given to you today.  *If you need a refill on your cardiac medications before your next appointment, please call your pharmacy*   Lab Work: None ordered   Testing/Procedures: None ordered   Follow-Up: At [facility], you and your health needs are our priority.  As part of our continuing mission to provide you with exceptional heart care, we have created designated Provider Care Teams.  These Care Teams include your primary Cardiologist (physician) and Advanced Practice Providers (APPs -  Physician Assistants and Nurse Practitioners) who all work together to provide you with the care you need, when you need it.  Your next appointment:   1 year(s)  The format for your next appointment:   In Person  Provider:   Doralee Albino, MD    Thank you for choosing [facility]!!   [phone]

## 2022-03-26 DIAGNOSIS — H40011 Open angle with borderline findings, low risk, right eye: Secondary | ICD-10-CM | POA: Diagnosis not present

## 2022-03-26 DIAGNOSIS — H2513 Age-related nuclear cataract, bilateral: Secondary | ICD-10-CM | POA: Diagnosis not present

## 2022-04-05 ENCOUNTER — Ambulatory Visit: Payer: Medicare HMO | Admitting: Podiatry

## 2022-04-05 DIAGNOSIS — D689 Coagulation defect, unspecified: Secondary | ICD-10-CM

## 2022-04-05 DIAGNOSIS — Q828 Other specified congenital malformations of skin: Secondary | ICD-10-CM

## 2022-04-05 NOTE — Progress Notes (Signed)
Subjective:   Patient ID: Joel [name], male   DOB: 77 y.o.   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

## 2022-04-29 ENCOUNTER — Encounter: Payer: Self-pay | Admitting: Specialist

## 2022-04-29 DIAGNOSIS — Z049 Encounter for examination and observation for unspecified reason: Secondary | ICD-10-CM | POA: Diagnosis not present

## 2022-04-29 DIAGNOSIS — G43109 Migraine with aura, not intractable, without status migrainosus: Secondary | ICD-10-CM | POA: Diagnosis not present

## 2022-04-30 ENCOUNTER — Other Ambulatory Visit: Payer: Self-pay | Admitting: Specialist

## 2022-04-30 DIAGNOSIS — R519 Headache, unspecified: Secondary | ICD-10-CM

## 2022-05-01 ENCOUNTER — Encounter: Payer: Self-pay | Admitting: Cardiovascular Disease

## 2022-05-01 DIAGNOSIS — F418 Other specified anxiety disorders: Secondary | ICD-10-CM | POA: Diagnosis not present

## 2022-05-01 DIAGNOSIS — R69 Illness, unspecified: Secondary | ICD-10-CM | POA: Diagnosis not present

## 2022-05-01 DIAGNOSIS — G44209 Tension-type headache, unspecified, not intractable: Secondary | ICD-10-CM | POA: Diagnosis not present

## 2022-05-15 DIAGNOSIS — G518 Other disorders of facial nerve: Secondary | ICD-10-CM | POA: Diagnosis not present

## 2022-05-15 DIAGNOSIS — M542 Cervicalgia: Secondary | ICD-10-CM | POA: Diagnosis not present

## 2022-05-15 DIAGNOSIS — G43109 Migraine with aura, not intractable, without status migrainosus: Secondary | ICD-10-CM | POA: Diagnosis not present

## 2022-05-15 DIAGNOSIS — M791 Myalgia, unspecified site: Secondary | ICD-10-CM | POA: Diagnosis not present

## 2022-05-22 ENCOUNTER — Encounter: Payer: Self-pay | Admitting: Podiatry

## 2022-05-22 ENCOUNTER — Ambulatory Visit: Payer: Medicare HMO | Admitting: Podiatry

## 2022-05-22 DIAGNOSIS — D689 Coagulation defect, unspecified: Secondary | ICD-10-CM | POA: Diagnosis not present

## 2022-05-22 DIAGNOSIS — M2042 Other hammer toe(s) (acquired), left foot: Secondary | ICD-10-CM | POA: Diagnosis not present

## 2022-05-22 DIAGNOSIS — M7751 Other enthesopathy of right foot: Secondary | ICD-10-CM | POA: Diagnosis not present

## 2022-05-22 NOTE — Progress Notes (Signed)
Subjective:   Patient ID: Joel [name], male   DOB: 77 y.o.   MRN: [id number]   HPI Patient presents with inflammation third digit right foot that remains very frustrating for him and states also is getting several lesions   ROS      Objective:  Physical Exam  Neurovascular status intact with inflammation plantar aspect of the right third digit with several lesions also present with fluid buildup around the area digital deformities     Assessment:  Third right with inflammatory component lesion formation     Plan:  H&P reviewed discussed the possibility for arthroplasty or bone smoothing which may be necessary educating him on this but at this point continue conservative and trimming done no angiogenic bleeding courtesy and will be seen back as indicated but I did educate him today on the possibility of surgery and what would be necessary

## 2022-05-31 ENCOUNTER — Encounter: Payer: Self-pay | Admitting: Specialist

## 2022-06-03 DIAGNOSIS — G518 Other disorders of facial nerve: Secondary | ICD-10-CM | POA: Diagnosis not present

## 2022-06-03 DIAGNOSIS — M791 Myalgia, unspecified site: Secondary | ICD-10-CM | POA: Diagnosis not present

## 2022-06-03 DIAGNOSIS — G43109 Migraine with aura, not intractable, without status migrainosus: Secondary | ICD-10-CM | POA: Diagnosis not present

## 2022-06-03 DIAGNOSIS — M542 Cervicalgia: Secondary | ICD-10-CM | POA: Diagnosis not present

## 2022-06-05 ENCOUNTER — Ambulatory Visit
Admission: RE | Admit: 2022-06-05 | Discharge: 2022-06-05 | Disposition: A | Discharge status: Home / Self Care | Payer: Medicare HMO | Source: Ambulatory Visit | Attending: Specialist | Admitting: Specialist

## 2022-06-05 DIAGNOSIS — R519 Headache, unspecified: Secondary | ICD-10-CM | POA: Diagnosis not present

## 2022-06-05 MED ORDER — GADOPICLENOL 0.5 MMOL/ML IV SOLN
9.0000 mL | Freq: Once | INTRAVENOUS | Status: AC | PRN
  Administered 2022-06-05: 9 mL via INTRAVENOUS

## 2022-06-10 ENCOUNTER — Encounter (INDEPENDENT_AMBULATORY_CARE_PROVIDER_SITE_OTHER): Payer: Medicare HMO | Admitting: Ophthalmology

## 2022-06-10 DIAGNOSIS — H43813 Vitreous degeneration, bilateral: Secondary | ICD-10-CM

## 2022-06-10 DIAGNOSIS — H33303 Unspecified retinal break, bilateral: Secondary | ICD-10-CM

## 2022-06-18 DIAGNOSIS — Z79899 Other long term (current) drug therapy: Secondary | ICD-10-CM | POA: Diagnosis not present

## 2022-06-18 DIAGNOSIS — G518 Other disorders of facial nerve: Secondary | ICD-10-CM | POA: Diagnosis not present

## 2022-06-18 DIAGNOSIS — G43809 Other migraine, not intractable, without status migrainosus: Secondary | ICD-10-CM | POA: Diagnosis not present

## 2022-06-18 DIAGNOSIS — M542 Cervicalgia: Secondary | ICD-10-CM | POA: Diagnosis not present

## 2022-06-18 DIAGNOSIS — M791 Myalgia, unspecified site: Secondary | ICD-10-CM | POA: Diagnosis not present

## 2022-06-20 DIAGNOSIS — Z0100 Encounter for examination of eyes and vision without abnormal findings: Secondary | ICD-10-CM | POA: Diagnosis not present

## 2022-06-25 ENCOUNTER — Encounter (INDEPENDENT_AMBULATORY_CARE_PROVIDER_SITE_OTHER): Payer: Medicare HMO | Admitting: Ophthalmology

## 2022-06-25 DIAGNOSIS — H33303 Unspecified retinal break, bilateral: Secondary | ICD-10-CM

## 2022-06-25 DIAGNOSIS — H43813 Vitreous degeneration, bilateral: Secondary | ICD-10-CM | POA: Diagnosis not present

## 2022-07-02 DIAGNOSIS — M542 Cervicalgia: Secondary | ICD-10-CM | POA: Diagnosis not present

## 2022-07-02 DIAGNOSIS — G518 Other disorders of facial nerve: Secondary | ICD-10-CM | POA: Diagnosis not present

## 2022-07-02 DIAGNOSIS — G43809 Other migraine, not intractable, without status migrainosus: Secondary | ICD-10-CM | POA: Diagnosis not present

## 2022-07-02 DIAGNOSIS — M791 Myalgia, unspecified site: Secondary | ICD-10-CM | POA: Diagnosis not present

## 2022-07-04 ENCOUNTER — Ambulatory Visit: Payer: Medicare HMO | Admitting: Podiatry

## 2022-07-04 DIAGNOSIS — L03031 Cellulitis of right toe: Secondary | ICD-10-CM | POA: Diagnosis not present

## 2022-07-04 DIAGNOSIS — Q828 Other specified congenital malformations of skin: Secondary | ICD-10-CM

## 2022-07-04 NOTE — Progress Notes (Signed)
Subjective:  Patient ID: Joel [name], male    DOB: 1945/08/21,  MRN: [id number]  Chief Complaint  Patient presents with   Ingrown Toenail    Rm 21 Right hallux ingrown pain redness and edema.    Callouses    Porokeratosis of right foot.     77 y.o. male presents with concern for possible infection or paronychia around the right hallux nail.  On the lateral border he had trim some of the nail back and may have trimmed a little too far.  There was no bleeding however he has noted some redness and swelling.  It has gone down since yesterday.  He does want to get it checked out as he is going on vacation next week.  He also has a porokeratosis that gets trimmed routinely on the right third toe plantar aspect hyperkeratotic  Past Medical History:  Diagnosis Date   Allergy    Anxiety    Arthritis    Atrial fibrillation ([facility])    Benign prostatic hypertrophy    Dysrhythmia    Family history of anesthesia complication    "MOTHER DEVELOPED DEMENTIA AFTER SURGERY"   Fundic gland polyps of stomach, benign with low-grade dysplasia 02/20/2018   Gastric polyps - hyperplastic 02/20/2018   GERD (gastroesophageal reflux disease)    INTERMITANT -NO MEDS   Hx of adenomatous polyp of colon 05/23/2009   Hypercholesterolemia    Memory loss    Nocturia     Allergies  Allergen Reactions   Penicillins Hives and Rash    Did it involve swelling of the face/tongue/throat, SOB, or low BP? No Did it involve sudden or severe rash/hives, skin peeling, or any reaction on the inside of your mouth or nose? Unk Did you need to seek medical attention at a hospital or doctor's office? Yes When did it last happen? 1970's If all above answers are "NO", may proceed with cephalosporin use.    Ciprofloxacin Other (See Comments)    Is currently taking Tambocor.Marland KitchenMarland KitchenINTERACTION??    ROS: Negative except as per HPI above  Objective:  General: AAO x3, NAD  Dermatological: With central hyperkeratotic core  third  toe plantar aspect consistent with porokeratosis.  Painful with palpation.  There is mild inflammation around the hallux nail on the lateral border there is incurvation of the nail possible ingrown.  Not significantly tender to palpation at this time the patient states redness and swelling have decreased significantly since yesterday.  Vascular:  Dorsalis Pedis artery and Posterior Tibial artery pedal pulses are 2/4 bilateral.  Capillary fill time < 3 sec to all digits.   Neruologic: Grossly intact via light touch bilateral. Protective threshold intact to all sites bilateral.   Musculoskeletal: No gross boney pedal deformities bilateral. No pain, crepitus, or limitation noted with foot and ankle range of motion bilateral. Muscular strength 5/5 in all groups tested bilateral.  Gait: Unassisted, Nonantalgic.   No images are attached to the encounter.   Assessment:   1. Porokeratosis   2. Paronychia of great toe of right foot      Plan:  Patient was evaluated and treated and all questions answered.  # Paronychia of great toe right foot -Mild ingrown nail causing some inflammation and irritation at the distal lateral aspect of the right hallux nail -Will hold off on ingrown procedure for removal of the ingrown nail at this time as the patient is going to the beach and will be in water and sand -I recommend ongoing use of antibiotic  ointment and Epsom salt soaks to decrease inflammation and pain in the right hallux nail he says not very painful at this time however -I recommend monitoring for worsening of redness and swelling if these occur call and we will send antibiotics however he does not feel he needs oral antibiotics at this time and.  # Porokeratosis subthird toe right foot All symptomatic hyperkeratoses x1 were safely debrided with a sterile #15 blade to patient's level of comfort without incident. We discussed preventative and palliative care of these lesions including supportive  and accommodative shoegear, padding, prefabricated and custom molded accommodative orthoses, use of a pumice stone and lotions/creams daily.    Return if symptoms worsen or fail to improve.          Corinna Gab, DPM [facility] / [facility]

## 2022-07-17 DIAGNOSIS — M791 Myalgia, unspecified site: Secondary | ICD-10-CM | POA: Diagnosis not present

## 2022-07-17 DIAGNOSIS — M542 Cervicalgia: Secondary | ICD-10-CM | POA: Diagnosis not present

## 2022-07-17 DIAGNOSIS — G518 Other disorders of facial nerve: Secondary | ICD-10-CM | POA: Diagnosis not present

## 2022-07-17 DIAGNOSIS — G43109 Migraine with aura, not intractable, without status migrainosus: Secondary | ICD-10-CM | POA: Diagnosis not present

## 2022-07-30 DIAGNOSIS — M791 Myalgia, unspecified site: Secondary | ICD-10-CM | POA: Diagnosis not present

## 2022-07-30 DIAGNOSIS — M542 Cervicalgia: Secondary | ICD-10-CM | POA: Diagnosis not present

## 2022-07-30 DIAGNOSIS — G518 Other disorders of facial nerve: Secondary | ICD-10-CM | POA: Diagnosis not present

## 2022-07-30 DIAGNOSIS — G43109 Migraine with aura, not intractable, without status migrainosus: Secondary | ICD-10-CM | POA: Diagnosis not present

## 2022-09-10 DIAGNOSIS — H40013 Open angle with borderline findings, low risk, bilateral: Secondary | ICD-10-CM | POA: Diagnosis not present

## 2022-09-10 DIAGNOSIS — H2513 Age-related nuclear cataract, bilateral: Secondary | ICD-10-CM | POA: Diagnosis not present

## 2022-10-07 DIAGNOSIS — E291 Testicular hypofunction: Secondary | ICD-10-CM | POA: Diagnosis not present

## 2022-10-07 DIAGNOSIS — R7989 Other specified abnormal findings of blood chemistry: Secondary | ICD-10-CM | POA: Diagnosis not present

## 2022-10-07 DIAGNOSIS — Z1212 Encounter for screening for malignant neoplasm of rectum: Secondary | ICD-10-CM | POA: Diagnosis not present

## 2022-10-07 DIAGNOSIS — E785 Hyperlipidemia, unspecified: Secondary | ICD-10-CM | POA: Diagnosis not present

## 2022-10-07 DIAGNOSIS — N401 Enlarged prostate with lower urinary tract symptoms: Secondary | ICD-10-CM | POA: Diagnosis not present

## 2022-10-07 LAB — LAB REPORT - SCANNED

## 2022-10-15 DIAGNOSIS — Z7689 Persons encountering health services in other specified circumstances: Secondary | ICD-10-CM | POA: Diagnosis not present

## 2022-10-17 DIAGNOSIS — G4486 Cervicogenic headache: Secondary | ICD-10-CM | POA: Diagnosis not present

## 2022-10-17 DIAGNOSIS — M25512 Pain in left shoulder: Secondary | ICD-10-CM | POA: Diagnosis not present

## 2022-10-17 DIAGNOSIS — M791 Myalgia, unspecified site: Secondary | ICD-10-CM | POA: Diagnosis not present

## 2022-10-17 DIAGNOSIS — M542 Cervicalgia: Secondary | ICD-10-CM | POA: Diagnosis not present

## 2022-10-22 ENCOUNTER — Encounter (INDEPENDENT_AMBULATORY_CARE_PROVIDER_SITE_OTHER): Payer: Medicare HMO | Admitting: Ophthalmology

## 2022-10-25 ENCOUNTER — Encounter (INDEPENDENT_AMBULATORY_CARE_PROVIDER_SITE_OTHER): Payer: Medicare HMO | Admitting: Ophthalmology

## 2022-10-25 DIAGNOSIS — H33303 Unspecified retinal break, bilateral: Secondary | ICD-10-CM

## 2022-10-25 DIAGNOSIS — H43813 Vitreous degeneration, bilateral: Secondary | ICD-10-CM

## 2022-10-29 DIAGNOSIS — H40013 Open angle with borderline findings, low risk, bilateral: Secondary | ICD-10-CM | POA: Diagnosis not present

## 2023-01-13 ENCOUNTER — Other Ambulatory Visit: Payer: Self-pay | Admitting: Internal Medicine

## 2023-01-13 NOTE — Telephone Encounter (Signed)
Prescription refill request for Eliquis received. Indication:afib Last office visit:2/24 Scr:0.89  2023 Age: 77 Weight:82.2  kg  Prescription refilled

## 2023-01-27 ENCOUNTER — Ambulatory Visit: Payer: PPO | Admitting: Podiatry

## 2023-01-27 ENCOUNTER — Encounter: Payer: Self-pay | Admitting: Podiatry

## 2023-01-27 DIAGNOSIS — Q828 Other specified congenital malformations of skin: Secondary | ICD-10-CM | POA: Diagnosis not present

## 2023-01-27 DIAGNOSIS — D689 Coagulation defect, unspecified: Secondary | ICD-10-CM | POA: Diagnosis not present

## 2023-01-28 NOTE — Progress Notes (Signed)
 Subjective:   Patient ID: Joel [name], male   DOB: 78 y.o.   MRN: [id number]   HPI Patient presents stating I have several lesions on my left foot that are very sore and hard to walk   ROS      Objective:  Physical Exam  Neurovascular status intact with patient found to have history of being on blood thinner due to A-fib with chronic lesion x 3 right painful     Assessment:  Chronic lesion right foot with pain     Plan:  H&P reviewed and risk of being on blood thinner and sterile sharp debridement of 3 lesions right no iatrogenic bleeding reappoint routine care

## 2023-02-04 ENCOUNTER — Ambulatory Visit: Payer: Medicare HMO | Admitting: Podiatry

## 2023-02-26 DIAGNOSIS — H2513 Age-related nuclear cataract, bilateral: Secondary | ICD-10-CM | POA: Diagnosis not present

## 2023-02-26 DIAGNOSIS — H40013 Open angle with borderline findings, low risk, bilateral: Secondary | ICD-10-CM | POA: Diagnosis not present

## 2023-02-26 DIAGNOSIS — H5211 Myopia, right eye: Secondary | ICD-10-CM | POA: Diagnosis not present

## 2023-02-26 DIAGNOSIS — N401 Enlarged prostate with lower urinary tract symptoms: Secondary | ICD-10-CM | POA: Diagnosis not present

## 2023-02-26 DIAGNOSIS — H5202 Hypermetropia, left eye: Secondary | ICD-10-CM | POA: Diagnosis not present

## 2023-03-04 ENCOUNTER — Telehealth: Payer: Self-pay | Admitting: *Deleted

## 2023-03-04 DIAGNOSIS — R351 Nocturia: Secondary | ICD-10-CM | POA: Diagnosis not present

## 2023-03-04 DIAGNOSIS — N401 Enlarged prostate with lower urinary tract symptoms: Secondary | ICD-10-CM | POA: Diagnosis not present

## 2023-03-04 DIAGNOSIS — R972 Elevated prostate specific antigen [PSA]: Secondary | ICD-10-CM | POA: Diagnosis not present

## 2023-03-04 NOTE — Telephone Encounter (Signed)
   Pre-operative Risk Assessment    Patient Name: Joel [name]  DOB: 1945/03/26 MRN: [id number]   Date of last office visit: 03/21/22 DR. MEALOR Date of next office visit: 03/17/23 DR. [facility]   Request for Surgical Clearance    Procedure:   PROSTATE Bx   Date of Surgery:  Clearance TBD                                Surgeon:  DR. Jennette Bill Surgeon's Group or Practice Name:  [facility] Phone number:  [phone] Fax number:  [phone]   Type of Clearance Requested:   - Medical  - Pharmacy:  Hold Apixaban (Eliquis)     Type of Anesthesia:  Not Indicated   Additional requests/questions:    Elpidio Anis   03/04/2023, 5:04 PM

## 2023-03-06 ENCOUNTER — Other Ambulatory Visit: Payer: Self-pay | Admitting: Urology

## 2023-03-06 ENCOUNTER — Encounter: Payer: Self-pay | Admitting: Urology

## 2023-03-06 DIAGNOSIS — R972 Elevated prostate specific antigen [PSA]: Secondary | ICD-10-CM

## 2023-03-07 NOTE — Telephone Encounter (Signed)
Tried to call the pt to set up labs to be done for preop clearance, BMET/CBC.    Pt also has appt in office with Dr. Nelly Laurence 03/17/23.

## 2023-03-07 NOTE — Telephone Encounter (Signed)
Patient with diagnosis of atrial fibrillation on Eliquis for anticoagulation.    Procedure:   PROSTATE Bx    Date of Surgery:  Clearance TBD     CHA2DS2-VASc Score = 5   This indicates a 7.2% annual risk of stroke. The patient's score is based upon: CHF History: 0 HTN History: 1 Diabetes History: 0 Stroke History: 2 Vascular Disease History: 0 Age Score: 2 Gender Score: 0   Chart notes in 2016 note hx of TIA's "in the past"  Need updated BMET, CBC for final determination, last drawn in 2023    **This guidance is not considered finalized until pre-operative APP has relayed final recommendations.**

## 2023-03-10 NOTE — Telephone Encounter (Signed)
I s/w the pt about labs needing to be done for preop clearance. Pt tells me that his procedure is actually going to be on hold for right now. Recent PSA came back showing better numbers per the pt. Pt states per Dr. Jennette Bill, "if don't need to do Bx then let's not." Pt states Dr. Jennette Bill suggest an MRI in [place] which then a decision will be made. In regard to the labs needed, pt said he had labs with PCP recently. I assured the pt that I will call PCP and see if they have labs that we are needing. Pt agreeable if still need to get labs he is willing, said just to let him know.   I assured the pt that I will let him know if we need the lab work still.   I will update the requesting office as well. I still going to need Bx, we will need a new request.

## 2023-03-10 NOTE — Telephone Encounter (Signed)
I received the labs from PCP today. I will enter in the needed information and present back to preop and preop Pharm-d.    LAB DRAW: 10/07/22  K+ 4.4 NA 133 CREATININE 0.7 CALCIUM 8.5 BUN 12 WBC 8.35 RBC 1.0 HGB 14.4 PLT 222

## 2023-03-10 NOTE — Telephone Encounter (Signed)
Returned call to [facility] from PCP office. Left message for Dondra Spry if she could fax the labs from 09/2022 that would be great. I will present them to the preop APP and preop Pharm-d. Will then scan into the chart.

## 2023-03-10 NOTE — Telephone Encounter (Signed)
Left message for medical records dept at PCP if recent BMP/CBC could be faxed to our office [phone] attn: preop team. Left message to call [phone] preop line.

## 2023-03-11 ENCOUNTER — Encounter: Payer: Self-pay | Admitting: Internal Medicine

## 2023-03-11 NOTE — Telephone Encounter (Signed)
   Name: Joel [name]  DOB: [birth year]  MRN: [id number]  Primary Cardiologist: Lewayne Bunting, MD  Chart reviewed as part of pre-operative protocol coverage. Because of Joel [name]'s past medical history and time since last visit, he will require a follow-up in-office visit in order to better assess preoperative cardiovascular risk.  Patient has an office visit scheduled on 03/17/2023 with Dr. Nelly Laurence. Appointment notes have been updated to reflect need for pre-op evaluation.   Pre-op covering staff:  - Please contact requesting surgeon's office via preferred method (i.e, phone, fax) to inform them of need for appointment prior to surgery.  This message will also be routed to pharmacy pool for input on holding Eliquis as requested below so that this information is available to the clearing provider at time of patient's appointment.   Carlos Levering, NP  03/11/2023, 1:32 PM

## 2023-03-11 NOTE — Telephone Encounter (Signed)
Patient with diagnosis of atrial fibrillation on Eliquis for anticoagulation.     Procedure:   PROSTATE Bx    Date of Surgery:  Clearance TBD       CHA2DS2-VASc Score = 5   This indicates a 7.2% annual risk of stroke. The patient's score is based upon: CHF History: 0 HTN History: 1 Diabetes History: 0 Stroke History: 2 Vascular Disease History: 0 Age Score: 2 Gender Score: 0   Chart notes in 2016 note hx of TIA's "in the past"   CrCl:105 ml/min Plt: 222    Per protocol, patient may hold Eliquis for 2 days prior to procedure.   **This guidance is not considered finalized until pre-operative APP has relayed final recommendations.**

## 2023-03-11 NOTE — Telephone Encounter (Signed)
I will update all parties involved pt has appt 03/17/23 with Dr. Nelly Laurence. Per preop APP defer clearance to appt with MD.

## 2023-03-17 ENCOUNTER — Ambulatory Visit: Payer: PPO | Attending: Cardiovascular Disease | Admitting: Cardiovascular Disease

## 2023-03-17 ENCOUNTER — Encounter: Payer: Self-pay | Admitting: Cardiovascular Disease

## 2023-03-17 VITALS — BP 130/78 | HR 61 | Ht 75.0 in | Wt 188.0 lb

## 2023-03-17 DIAGNOSIS — I495 Sick sinus syndrome: Secondary | ICD-10-CM

## 2023-03-17 DIAGNOSIS — I480 Paroxysmal atrial fibrillation: Secondary | ICD-10-CM

## 2023-03-17 NOTE — Patient Instructions (Signed)
 Medication Instructions:  Your physician recommends that you continue on your current medications as directed. Please refer to the Current Medication list given to you today. *If you need a refill on your cardiac medications before your next appointment, please call your pharmacy*   Follow-Up: At [facility], you and your health needs are our priority.  As part of our continuing mission to provide you with exceptional heart care, we have created designated Provider Care Teams.  These Care Teams include your primary Cardiologist (physician) and Advanced Practice Providers (APPs -  Physician Assistants and Nurse Practitioners) who all work together to provide you with the care you need, when you need it.  We recommend signing up for the patient portal called "MyChart".  Sign up information is provided on this After Visit Summary.  MyChart is used to connect with patients for Virtual Visits (Telemedicine).  Patients are able to view lab/test results, encounter notes, upcoming appointments, etc.  Non-urgent messages can be sent to your provider as well.   To learn more about what you can do with MyChart, go to [email or web address].    Your next appointment:   1 year(s)  Provider:   York Pellant, MD

## 2023-03-17 NOTE — Progress Notes (Signed)
 Cardiology Office Note:    Date:  03/17/2023   ID:  Joel [name], DOB 12-10-1945, MRN [id number]  PCP:  Rodrigo Ran, MD   [facility] Providers Cardiologist:  Lewayne Bunting, MD     Referring MD: Rodrigo Ran, MD   Chief complaint: palpitations  History of Present Illness:    Joel [name] is a 78 y.o. male with a hx of sinus node dysfunction and atrial fibrillation referred by Dr. Ladona Ridgel for arrhythmia management.  AF is documented on an ECG from 10/14/2009 under the media tab.  His AF had been reasonably well-managed on flecainide, but he has been having increased episodes. He was referred to discuss ablation. He has palpitations during episodes and can sense an irregular pulse. He has not had an EKG during an episode, to his recollection, for some time.  He is doing well otherwise, no chest pain, dyspnea, dependent edema.  He underwent atrial fibrillation in November 2023.  He tolerated the procedure well, and there were no complications.  His Apple Watch has detected some episodes of A-fib in the early blinding period, but he has not had any symptoms for watch detections of A-fib since.    EKGs/Labs/Other Studies Reviewed:       EKG:   EKG Interpretation Date/Time:  Monday March 17 2023 11:06:03 EST Ventricular Rate:  61 PR Interval:  174 QRS Duration:  82 QT Interval:  436 QTC Calculation: 438 R Axis:   68  Text Interpretation: Normal sinus rhythm Normal ECG When compared with ECG of 16-Jan-2022 10:49, No significant change was found Confirmed by York Pellant [phone]) on 03/17/2023 11:38:54 AM    Recent Labs: No results found for requested labs within last 365 days.    Risk Assessment/Calculations:    CHA2DS2-VASc Score = 5   This indicates a 7.2% annual risk of stroke. The patient's score is based upon: CHF History: 0 HTN History: 1 Diabetes History: 0 Stroke History: 2 Vascular Disease History: 0 Age Score: 2 Gender Score:  0     Physical Exam:    VS:  BP 130/78 (BP Location: Left Arm, Patient Position: Sitting, Cuff Size: Large)   Pulse 61   Ht 6\' 3"  (1.905 m)   Wt 188 lb (85.3 kg)   SpO2 95%   BMI 23.50 kg/m     Wt Readings from Last 3 Encounters:  03/17/23 188 lb (85.3 kg)  03/21/22 181 lb 3.2 oz (82.2 kg)  01/16/22 183 lb 6.4 oz (83.2 kg)     GEN:  Well nourished, well developed in no acute distress CARDIAC: brady, regular; no murmurs, rubs, gallops RESPIRATORY:  Normal work of breathing MUSCULOSKELETAL: no edema    ASSESSMENT & PLAN:    Atrial fibrillation: Paroxysmal. failed flecainide.  He is doing very well after ablation and remains in sinus rhythm.  High risk medication: Flecainide has been discontinued. I advised him that if he does have recurrence of atrial fibrillation, he can take a metoprolol pill and if the rhythm does not resolve, a dose of flecainide.  I asked him to give [place] a call if this were to occur.  Secondary hypercoagulable state: continue eliquis for CHADS2VASC of 3         Medication Adjustments/Labs and Tests Ordered: Current medicines are reviewed at length with the patient today.  Concerns regarding medicines are outlined above.  Orders Placed This Encounter  Procedures   EKG 12-Lead   No orders of the defined types were placed in  this encounter.    Signed, Maurice Small, MD  03/17/2023 11:39 AM    [facility]

## 2023-05-01 ENCOUNTER — Ambulatory Visit
Admission: RE | Admit: 2023-05-01 | Discharge: 2023-05-01 | Disposition: A | Discharge status: Home / Self Care | Payer: Medicare HMO | Source: Ambulatory Visit | Attending: Urology | Admitting: Urology

## 2023-05-01 DIAGNOSIS — R972 Elevated prostate specific antigen [PSA]: Secondary | ICD-10-CM

## 2023-05-01 MED ORDER — GADOPICLENOL 0.5 MMOL/ML IV SOLN
8.0000 mL | Freq: Once | INTRAVENOUS | Status: AC | PRN
  Administered 2023-05-01: 8 mL via INTRAVENOUS

## 2023-05-23 DIAGNOSIS — R972 Elevated prostate specific antigen [PSA]: Secondary | ICD-10-CM | POA: Diagnosis not present

## 2023-05-27 DIAGNOSIS — R972 Elevated prostate specific antigen [PSA]: Secondary | ICD-10-CM | POA: Diagnosis not present

## 2023-05-27 DIAGNOSIS — R351 Nocturia: Secondary | ICD-10-CM | POA: Diagnosis not present

## 2023-05-27 DIAGNOSIS — N401 Enlarged prostate with lower urinary tract symptoms: Secondary | ICD-10-CM | POA: Diagnosis not present

## 2023-09-16 DIAGNOSIS — H40013 Open angle with borderline findings, low risk, bilateral: Secondary | ICD-10-CM | POA: Diagnosis not present

## 2023-10-24 ENCOUNTER — Encounter (INDEPENDENT_AMBULATORY_CARE_PROVIDER_SITE_OTHER): Payer: Medicare HMO | Admitting: Ophthalmology

## 2023-10-24 DIAGNOSIS — H43813 Vitreous degeneration, bilateral: Secondary | ICD-10-CM

## 2023-10-24 DIAGNOSIS — H2513 Age-related nuclear cataract, bilateral: Secondary | ICD-10-CM | POA: Diagnosis not present

## 2023-10-24 DIAGNOSIS — H33303 Unspecified retinal break, bilateral: Secondary | ICD-10-CM

## 2023-11-24 DIAGNOSIS — R972 Elevated prostate specific antigen [PSA]: Secondary | ICD-10-CM | POA: Diagnosis not present

## 2023-12-01 DIAGNOSIS — N400 Enlarged prostate without lower urinary tract symptoms: Secondary | ICD-10-CM | POA: Diagnosis not present

## 2023-12-01 DIAGNOSIS — R399 Unspecified symptoms and signs involving the genitourinary system: Secondary | ICD-10-CM | POA: Diagnosis not present

## 2023-12-01 DIAGNOSIS — N401 Enlarged prostate with lower urinary tract symptoms: Secondary | ICD-10-CM | POA: Diagnosis not present

## 2023-12-01 DIAGNOSIS — R972 Elevated prostate specific antigen [PSA]: Secondary | ICD-10-CM | POA: Diagnosis not present

## 2023-12-01 DIAGNOSIS — R351 Nocturia: Secondary | ICD-10-CM | POA: Diagnosis not present

## 2023-12-22 DIAGNOSIS — E785 Hyperlipidemia, unspecified: Secondary | ICD-10-CM | POA: Diagnosis not present

## 2023-12-22 DIAGNOSIS — E291 Testicular hypofunction: Secondary | ICD-10-CM | POA: Diagnosis not present

## 2023-12-25 DIAGNOSIS — R82998 Other abnormal findings in urine: Secondary | ICD-10-CM | POA: Diagnosis not present

## 2024-01-12 ENCOUNTER — Other Ambulatory Visit: Payer: Self-pay

## 2024-01-12 DIAGNOSIS — I4891 Unspecified atrial fibrillation: Secondary | ICD-10-CM

## 2024-01-13 MED ORDER — APIXABAN 5 MG PO TABS: 5.0000 mg | ORAL_TABLET | Freq: Two times a day (BID) | ORAL | 0 refills | Status: DC

## 2024-01-13 NOTE — Telephone Encounter (Signed)
 Prescription refill request for Eliquis  received. Indication:afib Last office visit:2/25 Scr: needs labs Age: Weight: Prescription refilled

## 2024-01-28 ENCOUNTER — Encounter: Payer: Self-pay | Admitting: Cardiovascular Disease

## 2024-01-30 ENCOUNTER — Ambulatory Visit: Payer: Self-pay

## 2024-01-30 LAB — BASIC METABOLIC PANEL WITH GFR
BUN/Creatinine Ratio: 13 (ref 10–24)
BUN: 12 mg/dL (ref 8–27)
CO2: 24 mmol/L (ref 20–29)
Calcium: 9.7 mg/dL (ref 8.6–10.2)
Chloride: 101 mmol/L (ref 96–106)
Creatinine, Ser: 0.91 mg/dL (ref 0.76–1.27)
Glucose: 102 mg/dL — ABNORMAL HIGH (ref 70–99)
Potassium: 5.1 mmol/L (ref 3.5–5.2)
Sodium: 139 mmol/L (ref 134–144)
eGFR: 87 mL/min/1.73

## 2024-01-30 LAB — CBC WITH DIFFERENTIAL/PLATELET
Basophils Absolute: 0.1 x10E3/uL (ref 0.0–0.2)
Basos: 1 %
EOS (ABSOLUTE): 0.7 x10E3/uL — ABNORMAL HIGH (ref 0.0–0.4)
Eos: 9 %
Hematocrit: 44.8 % (ref 37.5–51.0)
Hemoglobin: 14.9 g/dL (ref 13.0–17.7)
Immature Grans (Abs): 0 x10E3/uL (ref 0.0–0.1)
Immature Granulocytes: 0 %
Lymphocytes Absolute: 2.8 x10E3/uL (ref 0.7–3.1)
Lymphs: 38 %
MCH: 34.5 pg — ABNORMAL HIGH (ref 26.6–33.0)
MCHC: 33.3 g/dL (ref 31.5–35.7)
MCV: 104 fL — ABNORMAL HIGH (ref 79–97)
Monocytes Absolute: 0.8 x10E3/uL (ref 0.1–0.9)
Monocytes: 10 %
Neutrophils Absolute: 3.1 x10E3/uL (ref 1.4–7.0)
Neutrophils: 42 %
Platelets: 237 x10E3/uL (ref 150–450)
RBC: 4.32 x10E6/uL (ref 4.14–5.80)
RDW: 12 % (ref 11.6–15.4)
WBC: 7.4 x10E3/uL (ref 3.4–10.8)

## 2024-02-08 ENCOUNTER — Other Ambulatory Visit: Payer: Self-pay | Admitting: Cardiovascular Disease

## 2024-10-29 ENCOUNTER — Encounter (INDEPENDENT_AMBULATORY_CARE_PROVIDER_SITE_OTHER): Admitting: Ophthalmology
